# Patient Record
Sex: Female | Born: 1950 | Race: Black or African American | Hispanic: No | Smoking: Former smoker
Health system: Southern US, Community
[De-identification: ages and names within clinical notes are randomized; demographics above are authoritative.]

## PROBLEM LIST (undated history)

## (undated) DIAGNOSIS — E119 Type 2 diabetes mellitus without complications: Secondary | ICD-10-CM

## (undated) DIAGNOSIS — E785 Hyperlipidemia, unspecified: Secondary | ICD-10-CM

## (undated) DIAGNOSIS — G4730 Sleep apnea, unspecified: Secondary | ICD-10-CM

## (undated) DIAGNOSIS — M1990 Unspecified osteoarthritis, unspecified site: Secondary | ICD-10-CM

## (undated) DIAGNOSIS — I10 Essential (primary) hypertension: Secondary | ICD-10-CM

## (undated) DIAGNOSIS — I872 Venous insufficiency (chronic) (peripheral): Secondary | ICD-10-CM

## (undated) DIAGNOSIS — C801 Malignant (primary) neoplasm, unspecified: Secondary | ICD-10-CM

## (undated) HISTORY — PX: BREAST EXCISIONAL BIOPSY: SUR124

## (undated) HISTORY — PX: ROTATOR CUFF REPAIR: SHX139

## (undated) HISTORY — PX: CHOLECYSTECTOMY: SHX55

## (undated) HISTORY — PX: WISDOM TOOTH EXTRACTION: SHX21

## (undated) HISTORY — PX: OTHER SURGICAL HISTORY: SHX169

## (undated) HISTORY — DX: Venous insufficiency (chronic) (peripheral): I87.2

---

## 1998-05-24 DIAGNOSIS — C801 Malignant (primary) neoplasm, unspecified: Secondary | ICD-10-CM

## 1998-05-24 HISTORY — DX: Malignant (primary) neoplasm, unspecified: C80.1

## 1999-05-25 HISTORY — PX: MASTECTOMY: SHX3

## 2013-04-15 ENCOUNTER — Encounter (HOSPITAL_COMMUNITY): Payer: Self-pay | Admitting: Emergency Medicine

## 2013-04-15 ENCOUNTER — Emergency Department (HOSPITAL_COMMUNITY)
Admission: EM | Admit: 2013-04-15 | Discharge: 2013-04-15 | Disposition: A | Discharge status: Home / Self Care | Payer: BC Managed Care – PPO | Attending: Emergency Medicine | Admitting: Emergency Medicine

## 2013-04-15 ENCOUNTER — Emergency Department (HOSPITAL_COMMUNITY): Payer: BC Managed Care – PPO

## 2013-04-15 DIAGNOSIS — M25561 Pain in right knee: Secondary | ICD-10-CM

## 2013-04-15 DIAGNOSIS — I10 Essential (primary) hypertension: Secondary | ICD-10-CM | POA: Insufficient documentation

## 2013-04-15 DIAGNOSIS — Y929 Unspecified place or not applicable: Secondary | ICD-10-CM | POA: Insufficient documentation

## 2013-04-15 DIAGNOSIS — Z79899 Other long term (current) drug therapy: Secondary | ICD-10-CM | POA: Insufficient documentation

## 2013-04-15 DIAGNOSIS — Z853 Personal history of malignant neoplasm of breast: Secondary | ICD-10-CM | POA: Insufficient documentation

## 2013-04-15 DIAGNOSIS — E119 Type 2 diabetes mellitus without complications: Secondary | ICD-10-CM | POA: Insufficient documentation

## 2013-04-15 DIAGNOSIS — S8990XA Unspecified injury of unspecified lower leg, initial encounter: Secondary | ICD-10-CM | POA: Insufficient documentation

## 2013-04-15 DIAGNOSIS — E785 Hyperlipidemia, unspecified: Secondary | ICD-10-CM | POA: Insufficient documentation

## 2013-04-15 DIAGNOSIS — Z87891 Personal history of nicotine dependence: Secondary | ICD-10-CM | POA: Insufficient documentation

## 2013-04-15 DIAGNOSIS — Y9301 Activity, walking, marching and hiking: Secondary | ICD-10-CM | POA: Insufficient documentation

## 2013-04-15 DIAGNOSIS — X500XXA Overexertion from strenuous movement or load, initial encounter: Secondary | ICD-10-CM | POA: Insufficient documentation

## 2013-04-15 HISTORY — DX: Hyperlipidemia, unspecified: E78.5

## 2013-04-15 HISTORY — DX: Type 2 diabetes mellitus without complications: E11.9

## 2013-04-15 HISTORY — DX: Essential (primary) hypertension: I10

## 2013-04-15 HISTORY — DX: Malignant (primary) neoplasm, unspecified: C80.1

## 2013-04-15 MED ORDER — NAPROXEN 500 MG PO TABS: 500.0000 mg | ORAL_TABLET | Freq: Two times a day (BID) | ORAL | Status: DC

## 2013-04-15 MED ORDER — HYDROCODONE-ACETAMINOPHEN 5-325 MG PO TABS: ORAL_TABLET | ORAL | Status: DC

## 2013-04-15 NOTE — ED Notes (Signed)
Pt alert and mentating appropriately upon d/c. Pt given d/c teaching and follow up care instructions with prescriptions. Pt verbalizes understanding and has no further questions upon d/c. Pt requested wheelchair upon d/c. NAD noted upon d/c. ACE bandage in place and ice pack.

## 2013-04-15 NOTE — Progress Notes (Signed)
Met Patient at bedside role of ED Case Manager explained.Patient reports increased right knee pain as reason for ED visit.Patient reports she resides at home with her husband and she       Has been hobbling around.Patient face sheet reflects no PCP or Health Insurance but patient reports patient registration Just updated her demographics and she has Insurance coverage         And follows up with her PCP.No Case Manager needs identified at this time.

## 2013-04-15 NOTE — ED Provider Notes (Signed)
Medical screening examination/treatment/procedure(s) were performed by non-physician practitioner and as supervising physician I was immediately available for consultation/collaboration.  EKG Interpretation   None         Gilda Crease, MD 04/15/13 1044

## 2013-04-15 NOTE — ED Notes (Signed)
Pt reports she twisted her right knee yesterday around 6pm, having pain to posterior knee. sts when she woke up this morning as she was walking she heard her knee pop. sts the back of her knee has been constant pain, able to ambulate but feels that she can't put as much pressure on it. Nad, skin warm and dry, resp e/u.

## 2013-04-15 NOTE — ED Provider Notes (Signed)
CSN: [id number]     Arrival date & time 04/15/13  4098 History   First MD Initiated Contact with Patient 04/15/13 [phone]     Chief Complaint  Patient presents with  . Knee Pain   (Consider location/radiation/quality/duration/timing/severity/associated sxs/prior Treatment) HPI Comments: Patient presents with complaint of right knee pain that began acutely at approximately 6 PM yesterday when she twisted her knee. She complains of mild pain and discomfort that was worse this morning. Pain is made worse with weightbearing and with standing up from a sitting position. She is walking with a limp but can bear weight. Pain is worse over the posterior aspect of the knee. It radiates to her shin. She took Aleve without relief. The onset of this condition was acute. The course is constant.    The history is provided by the patient.    Past Medical History  Diagnosis Date  . Diabetes mellitus without complication   . Hyperlipemia   . Hypertension   . 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 Mastectomy Left 05/1999  . Cholecystectomy    . Wisdom tooth extraction     No family history on file. History  Substance Use Topics  . Smoking status: Former Smoker    Quit date: 05/24/1993  . Smokeless tobacco: Not on file  . Alcohol Use: Yes     Comment: occasionlly   OB History   Grav Para Term Preterm Abortions TAB SAB Ect Mult Living                 Review of Systems  Constitutional: Negative for activity change.  Musculoskeletal: Positive for arthralgias and gait problem. Negative for back pain, joint swelling and neck pain.  Skin: Negative for wound.  Neurological: Negative for weakness and numbness.    Allergies  Review of patient's allergies indicates no known allergies.  Home Medications   Current Outpatient Rx  Name  Route  Sig  Dispense  Refill  . glipiZIDE (GLUCOTROL XL) 10 MG 24 hr tablet   Oral   Take 20 mg by mouth daily.         . Liraglutide (VICTOZA) 18 MG/3ML SOPN   Subcutaneous   Inject 1.8 mLs into the skin daily.         Marland Kitchen lisinopril (PRINIVIL,ZESTRIL) 10 MG tablet   Oral   Take 10 mg by mouth daily.         . metFORMIN (GLUCOPHAGE) 500 MG tablet   Oral   Take 1,000 mg by mouth 2 (two) times daily with a meal.         . rosuvastatin (CRESTOR) 20 MG tablet   Oral   Take 20 mg by mouth daily.         . Vitamin D, Ergocalciferol, (DRISDOL) 50000 UNITS CAPS capsule   Oral   Take 50,000 Units by mouth every 7 (seven) days.         Marland Kitchen HYDROcodone-acetaminophen (NORCO/VICODIN) 5-325 MG per tablet      Take 1-2 tablets every 6 hours as needed for severe pain   10 tablet   0   . naproxen (NAPROSYN) 500 MG tablet   Oral   Take 1 tablet (500 mg total) by mouth 2 (two) times daily.   30 tablet   0    BP 134/79  Pulse 68  Temp(Src) 96.8 F (36 C) (Oral)  Resp 18  SpO2 96% Physical Exam  Nursing  note and vitals reviewed. Constitutional: She appears well-developed and well-nourished.  HENT:  Head: Normocephalic and atraumatic.  Eyes: Pupils are equal, round, and reactive to light.  Neck: Normal range of motion. Neck supple.  Cardiovascular: Exam reveals no decreased pulses.   Pulses:      Dorsalis pedis pulses are 2+ on the right side, and 2+ on the left side.       Posterior tibial pulses are 2+ on the right side, and 2+ on the left side.  Musculoskeletal: She exhibits tenderness. She exhibits no edema.       Right hip: Normal.       Right knee: Tenderness found. Medial joint line and lateral joint line tenderness noted.       Right ankle: Normal.       Legs: Patient can extend knee against gravity. Pain with full active extension and forced flexion.   Neurological: She is alert. No sensory deficit.  Motor, sensation, and vascular distal to the injury is fully intact.   Skin: Skin is warm and dry.  Psychiatric: She has a normal mood and affect.    ED Course  Procedures  (including critical care time) Labs Review Labs Reviewed - No data to display Imaging Review Dg Knee Complete 4 Views Right  04/15/2013   CLINICAL DATA:  Right knee pain after injury.  EXAM: RIGHT KNEE - COMPLETE 4+ VIEW  COMPARISON:  None.  FINDINGS: There is no evidence of fracture, dislocation, or joint effusion. Spurring of superior aspect of patella is noted. No significant joint space narrowing is noted. Minimal spurring of tibial spines is noted. Soft tissues are unremarkable.  IMPRESSION: Minimal degenerative changes are noted. No acute abnormality seen in the right knee.   Electronically Signed   By: Roque Lias M.D.   On: 04/15/2013 09:48    EKG Interpretation   None      8:58 AM Patient seen and examined. Work-up initiated. Medications ordered.   Vital signs reviewed and are as follows: Filed Vitals:   04/15/13 0845  BP: 134/79  Pulse: 68  Temp: 96.8 F (36 C)  Resp: 18   10:41 AM Xray result reviewed. Patient informed. TX: ACE, RICE, pain medicine, NSAID, ortho f/u if not improved in 1 week. Patient verbalizes understanding and agrees with plan.   Patient counseled on use of narcotic pain medications. Counseled not to combine these medications with others containing tylenol. Urged not to drink alcohol, drive, or perform any other activities that requires focus while taking these medications. The patient verbalizes understanding and agrees with the plan.     MDM   1. Knee pain, acute, right    X-ray negative, suspect hamstring injury based on exam. Conservative mgmt with orthopedic followup if not improving.   Renne Crigler, PA-C 04/15/13 1042

## 2013-04-15 NOTE — ED Notes (Signed)
Pt returns from radiology. 

## 2013-04-15 NOTE — ED Notes (Signed)
Josh, PA at bedside. °

## 2014-01-14 ENCOUNTER — Ambulatory Visit (INDEPENDENT_AMBULATORY_CARE_PROVIDER_SITE_OTHER): Payer: Medicare PPO | Admitting: Family Medicine

## 2014-01-14 DIAGNOSIS — R609 Edema, unspecified: Secondary | ICD-10-CM

## 2014-01-14 DIAGNOSIS — IMO0001 Reserved for inherently not codable concepts without codable children: Secondary | ICD-10-CM

## 2014-01-14 DIAGNOSIS — I10 Essential (primary) hypertension: Secondary | ICD-10-CM

## 2014-01-14 DIAGNOSIS — R600 Localized edema: Secondary | ICD-10-CM

## 2014-01-14 DIAGNOSIS — Z1211 Encounter for screening for malignant neoplasm of colon: Secondary | ICD-10-CM

## 2014-01-14 DIAGNOSIS — Z1231 Encounter for screening mammogram for malignant neoplasm of breast: Secondary | ICD-10-CM

## 2014-01-14 DIAGNOSIS — E1165 Type 2 diabetes mellitus with hyperglycemia: Secondary | ICD-10-CM

## 2014-01-14 DIAGNOSIS — E785 Hyperlipidemia, unspecified: Secondary | ICD-10-CM

## 2014-01-14 MED ORDER — LISINOPRIL-HYDROCHLOROTHIAZIDE 20-12.5 MG PO TABS: 1.0000 | ORAL_TABLET | Freq: Every day | ORAL | Status: DC

## 2014-01-14 MED ORDER — NAPROXEN 500 MG PO TABS: 500.0000 mg | ORAL_TABLET | Freq: Two times a day (BID) | ORAL | Status: DC | PRN

## 2014-01-14 NOTE — Progress Notes (Signed)
Subjective:    Patient ID: [name], female    DOB: June 19, 1950, 63 y.o.   MRN: [id number]  HPI  Ms. Spearmand presents to office to establish care.She states that she was going to [facility] in [place], [place] with Dr. Carolanne Grumbling prior to relocating to [place].    Patient reports that she has a history of diabetes. She reports that she was previously on Metformin and Victoza. She stopped taking medications and was following a homeopathic regimen. She reports that she discontinued the regimen several months ago. She reports that she was also started on Lisinopril for DMII. The naturalist recommended that she continue this medication due to the fact that it should not discontinue it abruptly. She reports that she takes Lisinopril and Crestor for hyperlipidemia daily. She reports that she exercises 5 days per week, but has been unable to lose weight. She reports that she is currently eating 1 meal per day.   Patient reports and history of  hyperlipidemia.   There is not a family history of hyperlipidemia or of early ischemia heart disease. She currently denies chest pains, palpitations, dizziness, headaches, weakness, or shortness of breath.    Patient complains of periodic swelling to feet.   The edema has been mild and localized.   Onset of symptoms was several months ago. She states that edema has not worsened over time.  The edema is present intermittently. The swelling has been aggravated by increased salt intake, relieved by elevation of involved area.     Review of Systems  Constitutional: Negative.   HENT: Negative.   Eyes: Negative.   Respiratory: Negative.   Cardiovascular: Positive for leg swelling (bilateral ankle edema).  Gastrointestinal: Positive for abdominal distention.  Endocrine: Negative.   Genitourinary: Negative.   Musculoskeletal: Positive for joint swelling and myalgias.  Skin: Negative.   Neurological: Negative.   Hematological: Negative.    Psychiatric/Behavioral: Negative.        Objective:   Physical Exam  Constitutional: She is oriented to person, place, and time. She appears well-developed and well-nourished.  HENT:  Head: Normocephalic and atraumatic.  Right Ear: External ear normal.  Neck: Normal range of motion. Neck supple.  Cardiovascular: Normal rate, regular rhythm, normal heart sounds, intact distal pulses and normal pulses.  Exam reveals no decreased pulses.   Pulses:      Radial pulses are 2+ on the right side, and 2+ on the left side.  Bilateral lower extremity edema  Pulmonary/Chest: Effort normal and breath sounds normal.  Abdominal: Soft. Bowel sounds are normal.  Musculoskeletal: Normal range of motion.       Right ankle: She exhibits swelling. She exhibits normal range of motion and no ecchymosis.       Left ankle: She exhibits swelling. She exhibits no ecchymosis.  Neurological: She is alert and oriented to person, place, and time. She has normal reflexes.  Skin: Skin is warm and dry.  Psychiatric: She has a normal mood and affect. Her behavior is normal. Judgment and thought content normal.         BP 171/83  Pulse 80  Temp(Src) 98.2 F (36.8 C) (Oral)  Resp 16  Ht 5' 0.5" (1.537 m)  Wt 245 lb (111.131 kg)  BMI 47.04 kg/m2 Assessment & Plan:    1. Type II or unspecified type diabetes mellitus without mention of complication, uncontrolled -Patient is currently not taking medications for diabetes. She was taking Victoza and Metformin until December 2014 She states that her  last hemoglobin A1c was 6. She reports that she is currently exercising and has decreased meals. Recommend that she start an 1800 calorie lowfat, low carbohydrate diet divided over 6 small meals, increase water intake to 6-8 glasses per day, and continue exercise regimen.  - Hemoglobin A1c; Future - COMPLETE METABOLIC PANEL WITH GFR; Future - Urinalysis, Complete; Future  2. Morbid obesity  - TSH; Future  3.  Unspecified essential hypertension Blood pressure elevated on manual re-check 182/110.  - Start lisinopril-hydrochlorothiazide (ZESTORETIC) 20-12.5 MG per tablet; Take 1 tablet by mouth daily.  Dispense: 30 tablet; Refill: 2  4. Bilateral edema of lower extremity 2 + pitting edema to bilateral lower extremities. She reports that edema primarily occurs at the end of the day. Wlill start [place].  - Pro b natriuretic peptide; Future - Urinalysis, Complete; Future  5. Other and unspecified hyperlipidemia  - Lipid Panel; Future  6. Other screening mammogram Mammogram was in May 2015 in [facility], patient was instructed to follow up in 6 months. She will need a mammogram in October.  She reports that it has been several years since last mammogram. She reports a history of left breast leakage. Will send referral   7. Screening for colon cancer She states that she has never had a colonoscopy. Will send referral  - Ambulatory referral to Gastroenterology     Dorena Dew, FNP

## 2014-01-14 NOTE — Patient Instructions (Addendum)
DASH Eating Plan DASH stands for "Dietary Approaches to Stop Hypertension." The DASH eating plan is a healthy eating plan that has been shown to reduce high blood pressure (hypertension). Additional health benefits may include reducing the risk of type 2 diabetes mellitus, heart disease, and stroke. The DASH eating plan may also help with weight loss. WHAT DO I NEED TO KNOW ABOUT THE DASH EATING PLAN? For the DASH eating plan, you will follow these general guidelines:  Choose foods with a percent daily value for sodium of less than 5% (as listed on the food label).  Use salt-free seasonings or herbs instead of table salt or sea salt.  Check with your health care provider or pharmacist before using salt substitutes.  Eat lower-sodium products, often labeled as "lower sodium" or "no salt added."  Eat fresh foods.  Eat more vegetables, fruits, and low-fat dairy products.  Choose whole grains. Look for the word "whole" as the first word in the ingredient list.  Choose fish and skinless chicken or turkey more often than red meat. Limit fish, poultry, and meat to 6 oz (170 g) each day.  Limit sweets, desserts, sugars, and sugary drinks.  Choose heart-healthy fats.  Limit cheese to 1 oz (28 g) per day.  Eat more home-cooked food and less restaurant, buffet, and fast food.  Limit fried foods.  Cook foods using methods other than frying.  Limit canned vegetables. If you do use them, rinse them well to decrease the sodium.  When eating at a restaurant, ask that your food be prepared with less salt, or no salt if possible. WHAT FOODS CAN I EAT? Seek help from a dietitian for individual calorie needs. Grains Whole grain or whole wheat bread. Brown rice. Whole grain or whole wheat pasta. Quinoa, bulgur, and whole grain cereals. Low-sodium cereals. Corn or whole wheat flour tortillas. Whole grain cornbread. Whole grain crackers. Low-sodium crackers. Vegetables Fresh or frozen vegetables  (raw, steamed, roasted, or grilled). Low-sodium or reduced-sodium tomato and vegetable juices. Low-sodium or reduced-sodium tomato sauce and paste. Low-sodium or reduced-sodium canned vegetables.  Fruits All fresh, canned (in natural juice), or frozen fruits. Meat and Other Protein Products Ground beef (85% or leaner), grass-fed beef, or beef trimmed of fat. Skinless chicken or turkey. Ground chicken or turkey. Pork trimmed of fat. All fish and seafood. Eggs. Dried beans, peas, or lentils. Unsalted nuts and seeds. Unsalted canned beans. Dairy Low-fat dairy products, such as skim or 1% milk, 2% or reduced-fat cheeses, low-fat ricotta or cottage cheese, or plain low-fat yogurt. Low-sodium or reduced-sodium cheeses. Fats and Oils Tub margarines without trans fats. Light or reduced-fat mayonnaise and salad dressings (reduced sodium). Avocado. Safflower, olive, or canola oils. Natural peanut or almond butter. Other Unsalted popcorn and pretzels. The items listed above may not be a complete list of recommended foods or beverages. Contact your dietitian for more options. WHAT FOODS ARE NOT RECOMMENDED? Grains White bread. White pasta. White rice. Refined cornbread. Bagels and croissants. Crackers that contain trans fat. Vegetables Creamed or fried vegetables. Vegetables in a cheese sauce. Regular canned vegetables. Regular canned tomato sauce and paste. Regular tomato and vegetable juices. Fruits Dried fruits. Canned fruit in light or heavy syrup. Fruit juice. Meat and Other Protein Products Fatty cuts of meat. Ribs, chicken wings, bacon, sausage, bologna, salami, chitterlings, fatback, hot dogs, bratwurst, and packaged luncheon meats. Salted nuts and seeds. Canned beans with salt. Dairy Whole or 2% milk, cream, half-and-half, and cream cheese. Whole-fat or sweetened yogurt. Full-fat   cheeses or blue cheese. Nondairy creamers and whipped toppings. Processed cheese, cheese spreads, or cheese  curds. Condiments Onion and garlic salt, seasoned salt, table salt, and sea salt. Canned and packaged gravies. Worcestershire sauce. Tartar sauce. Barbecue sauce. Teriyaki sauce. Soy sauce, including reduced sodium. Steak sauce. Fish sauce. Oyster sauce. Cocktail sauce. Horseradish. Ketchup and mustard. Meat flavorings and tenderizers. Bouillon cubes. Hot sauce. Tabasco sauce. Marinades. Taco seasonings. Relishes. Fats and Oils Butter, stick margarine, lard, shortening, ghee, and bacon fat. Coconut, palm kernel, or palm oils. Regular salad dressings. Other Pickles and olives. Salted popcorn and pretzels. The items listed above may not be a complete list of foods and beverages to avoid. Contact your dietitian for more information. WHERE CAN I FIND MORE INFORMATION? National Heart, Lung, and Blood Institute: travelstabloid.com Document Released: 04/29/2011 Document Revised: 09/24/2013 Document Reviewed: 03/14/2013 [facility] Patient Information 2015 [place], [place]. This information is not intended to replace advice given to you by your health care provider. Make sure you discuss any questions you have with your health care provider.  Diet Example  Patient is to eat 6 small meals throughout the day. Rule of 10 (don't exceed 10 of anything) Breakfast- [place] bacon, egg whites Snack-10 grapes Lunch-1/2 sandwich Snack-10 carrots Dinner(fish, 2 vegetables, brown rice) Snack- Ex. Popcorn (lowfat)   Exercise 3-5 times per week (3 days water aerobics, and 2 days low impact aerobics)

## 2014-01-15 ENCOUNTER — Other Ambulatory Visit: Payer: Medicare Other

## 2014-01-15 DIAGNOSIS — E785 Hyperlipidemia, unspecified: Secondary | ICD-10-CM

## 2014-01-15 DIAGNOSIS — IMO0001 Reserved for inherently not codable concepts without codable children: Secondary | ICD-10-CM

## 2014-01-15 DIAGNOSIS — I10 Essential (primary) hypertension: Secondary | ICD-10-CM

## 2014-01-15 DIAGNOSIS — R600 Localized edema: Secondary | ICD-10-CM

## 2014-01-15 DIAGNOSIS — E1165 Type 2 diabetes mellitus with hyperglycemia: Secondary | ICD-10-CM

## 2014-01-16 LAB — COMPLETE METABOLIC PANEL WITH GFR
ALBUMIN: 4 g/dL (ref 3.5–5.2)
ALT: 33 U/L (ref 0–35)
AST: 19 U/L (ref 0–37)
Alkaline Phosphatase: 108 U/L (ref 39–117)
BILIRUBIN TOTAL: 0.6 mg/dL (ref 0.2–1.2)
BUN: 16 mg/dL (ref 6–23)
CO2: 25 mEq/L (ref 19–32)
Calcium: 9.4 mg/dL (ref 8.4–10.5)
Chloride: 102 mEq/L (ref 96–112)
Creat: 0.56 mg/dL (ref 0.50–1.10)
GFR, Est African American: >89 mL/min
GFR, Est Non African American: >89 mL/min
GLUCOSE: 199 mg/dL — AB (ref 70–99)
Potassium: 4.3 mEq/L (ref 3.5–5.3)
Sodium: 138 mEq/L (ref 135–145)
Total Protein: 6.9 g/dL (ref 6.0–8.3)

## 2014-01-16 LAB — URINALYSIS, COMPLETE
Bacteria, UA: NONE SEEN
Bilirubin Urine: NEGATIVE
CASTS: NONE SEEN
Crystals: NONE SEEN
Glucose, UA: NEGATIVE mg/dL
HGB URINE DIPSTICK: NEGATIVE
Ketones, ur: NEGATIVE mg/dL
Leukocytes, UA: NEGATIVE
Nitrite: NEGATIVE
Protein, ur: NEGATIVE mg/dL
Specific Gravity, Urine: 1.023 (ref 1.005–1.030)
Urobilinogen, UA: 0.2 mg/dL (ref 0.0–1.0)
pH: 5.5 (ref 5.0–8.0)

## 2014-01-16 LAB — TSH: TSH: 1.17 u[IU]/mL (ref 0.350–4.500)

## 2014-01-16 LAB — LIPID PANEL
Cholesterol: 169 mg/dL (ref 0–200)
HDL: 45 mg/dL (ref >39)
LDL Cholesterol: 96 mg/dL (ref 0–99)
Total CHOL/HDL Ratio: 3.8 Ratio
Triglycerides: 140 mg/dL (ref <150)
VLDL: 28 mg/dL (ref 0–40)

## 2014-01-16 LAB — HEMOGLOBIN A1C
Hgb A1c MFr Bld: 8.3 % — ABNORMAL HIGH (ref <5.7)
Mean Plasma Glucose: 192 mg/dL — ABNORMAL HIGH (ref <117)

## 2014-01-16 LAB — PRO B NATRIURETIC PEPTIDE: PRO B NATRI PEPTIDE: 14.71 pg/mL (ref <126)

## 2014-01-17 ENCOUNTER — Telehealth: Payer: Self-pay | Admitting: Family Medicine

## 2014-01-17 ENCOUNTER — Encounter: Payer: Self-pay | Admitting: Family Medicine

## 2014-01-17 DIAGNOSIS — R600 Localized edema: Secondary | ICD-10-CM | POA: Insufficient documentation

## 2014-01-17 DIAGNOSIS — E1165 Type 2 diabetes mellitus with hyperglycemia: Principal | ICD-10-CM

## 2014-01-17 DIAGNOSIS — I10 Essential (primary) hypertension: Secondary | ICD-10-CM | POA: Insufficient documentation

## 2014-01-17 DIAGNOSIS — Z1231 Encounter for screening mammogram for malignant neoplasm of breast: Secondary | ICD-10-CM | POA: Insufficient documentation

## 2014-01-17 DIAGNOSIS — E119 Type 2 diabetes mellitus without complications: Secondary | ICD-10-CM | POA: Insufficient documentation

## 2014-01-17 DIAGNOSIS — E785 Hyperlipidemia, unspecified: Secondary | ICD-10-CM | POA: Insufficient documentation

## 2014-01-17 DIAGNOSIS — IMO0001 Reserved for inherently not codable concepts without codable children: Secondary | ICD-10-CM

## 2014-01-17 DIAGNOSIS — Z1211 Encounter for screening for malignant neoplasm of colon: Secondary | ICD-10-CM | POA: Insufficient documentation

## 2014-01-17 MED ORDER — SITAGLIPTIN PHOS-METFORMIN HCL 50-500 MG PO TABS: 1.0000 | ORAL_TABLET | Freq: Two times a day (BID) | ORAL | Status: DC

## 2014-01-17 NOTE — Telephone Encounter (Signed)
Meds ordered this encounter  Medications  . sitaGLIPtin-metformin (JANUMET) 50-500 MG per tablet    Sig: Take 1 tablet by mouth 2 (two) times daily with a meal.    Dispense:  30 tablet    Refill:  2    Order Specific Question:  Supervising Provider    Answer:  MATTHEWS, MICHELLE A [[id number]]  Discussed Hemoglobin A1c results at length. Will add Janumet 50-500 mg BID. Also, Ms. Smaltz will check blood sugar every am prior to breakfast. She will bring blood sugar diary to next appt. She has a CBG monitor and strips. Will also refer to diabetes education. Patient will return for a re-check in 1 month.    Dorena Dew, FNP

## 2014-01-18 ENCOUNTER — Telehealth (HOSPITAL_COMMUNITY): Payer: Self-pay | Admitting: *Deleted

## 2014-01-18 ENCOUNTER — Other Ambulatory Visit: Payer: Self-pay

## 2014-01-18 DIAGNOSIS — IMO0001 Reserved for inherently not codable concepts without codable children: Secondary | ICD-10-CM

## 2014-01-18 DIAGNOSIS — E1165 Type 2 diabetes mellitus with hyperglycemia: Principal | ICD-10-CM

## 2014-01-18 MED ORDER — SITAGLIPTIN PHOS-METFORMIN HCL 50-500 MG PO TABS: 1.0000 | ORAL_TABLET | Freq: Two times a day (BID) | ORAL | Status: DC

## 2014-01-18 NOTE — Telephone Encounter (Signed)
Spoke with Pharmacy and re-sent rx in via e-scribe. Called and notified patient that rx had been sent again and she could pick it up with them in approximently 1 hour. Thanks!

## 2014-01-18 NOTE — Telephone Encounter (Signed)
Received patient call concerning prescription; patient states she has contacted the pharmacy at [facility] this morning and they state that they do not have prescription for her present. This RN verified correct pharmacy and followed up with Mickel Baas LPN. Mickel Baas states prescription faxed yesterday to [facility]. Mickel Baas to call [facility] to verify prescription. Patient aware.

## 2014-02-11 ENCOUNTER — Telehealth: Payer: Self-pay

## 2014-02-11 NOTE — Telephone Encounter (Signed)
Left message for patient to call back regarding colonoscopy

## 2014-02-18 ENCOUNTER — Ambulatory Visit (INDEPENDENT_AMBULATORY_CARE_PROVIDER_SITE_OTHER): Payer: Medicare Other | Admitting: Family Medicine

## 2014-02-18 VITALS — BP 132/62 | HR 67 | Temp 98.4°F | Resp 16 | Ht 60.5 in | Wt 246.0 lb

## 2014-02-18 DIAGNOSIS — E1165 Type 2 diabetes mellitus with hyperglycemia: Secondary | ICD-10-CM

## 2014-02-18 DIAGNOSIS — IMO0002 Reserved for concepts with insufficient information to code with codable children: Secondary | ICD-10-CM

## 2014-02-18 DIAGNOSIS — Z23 Encounter for immunization: Secondary | ICD-10-CM

## 2014-02-18 DIAGNOSIS — I10 Essential (primary) hypertension: Secondary | ICD-10-CM

## 2014-02-18 DIAGNOSIS — IMO0001 Reserved for inherently not codable concepts without codable children: Secondary | ICD-10-CM

## 2014-02-18 MED ORDER — METFORMIN HCL 500 MG PO TABS: 500.0000 mg | ORAL_TABLET | Freq: Two times a day (BID) | ORAL | Status: DC

## 2014-02-18 MED ORDER — LISINOPRIL-HYDROCHLOROTHIAZIDE 20-12.5 MG PO TABS: 1.0000 | ORAL_TABLET | Freq: Every day | ORAL | Status: DC

## 2014-02-18 NOTE — Patient Instructions (Signed)
DASH Eating Plan DASH stands for "Dietary Approaches to Stop Hypertension." The DASH eating plan is a healthy eating plan that has been shown to reduce high blood pressure (hypertension). Additional health benefits may include reducing the risk of type 2 diabetes mellitus, heart disease, and stroke. The DASH eating plan may also help with weight loss. WHAT DO I NEED TO KNOW ABOUT THE DASH EATING PLAN? For the DASH eating plan, you will follow these general guidelines:  Choose foods with a percent daily value for sodium of less than 5% (as listed on the food label).  Use salt-free seasonings or herbs instead of table salt or sea salt.  Check with your health care provider or pharmacist before using salt substitutes.  Eat lower-sodium products, often labeled as "lower sodium" or "no salt added."  Eat fresh foods.  Eat more vegetables, fruits, and low-fat dairy products.  Choose whole grains. Look for the word "whole" as the first word in the ingredient list.  Choose fish and skinless chicken or turkey more often than red meat. Limit fish, poultry, and meat to 6 oz (170 g) each day.  Limit sweets, desserts, sugars, and sugary drinks.  Choose heart-healthy fats.  Limit cheese to 1 oz (28 g) per day.  Eat more home-cooked food and less restaurant, buffet, and fast food.  Limit fried foods.  Cook foods using methods other than frying.  Limit canned vegetables. If you do use them, rinse them well to decrease the sodium.  When eating at a restaurant, ask that your food be prepared with less salt, or no salt if possible. WHAT FOODS CAN I EAT? Seek help from a dietitian for individual calorie needs. Grains Whole grain or whole wheat bread. Brown rice. Whole grain or whole wheat pasta. Quinoa, bulgur, and whole grain cereals. Low-sodium cereals. Corn or whole wheat flour tortillas. Whole grain cornbread. Whole grain crackers. Low-sodium crackers. Vegetables Fresh or frozen vegetables  (raw, steamed, roasted, or grilled). Low-sodium or reduced-sodium tomato and vegetable juices. Low-sodium or reduced-sodium tomato sauce and paste. Low-sodium or reduced-sodium canned vegetables.  Fruits All fresh, canned (in natural juice), or frozen fruits. Meat and Other Protein Products Ground beef (85% or leaner), grass-fed beef, or beef trimmed of fat. Skinless chicken or turkey. Ground chicken or turkey. Pork trimmed of fat. All fish and seafood. Eggs. Dried beans, peas, or lentils. Unsalted nuts and seeds. Unsalted canned beans. Dairy Low-fat dairy products, such as skim or 1% milk, 2% or reduced-fat cheeses, low-fat ricotta or cottage cheese, or plain low-fat yogurt. Low-sodium or reduced-sodium cheeses. Fats and Oils Tub margarines without trans fats. Light or reduced-fat mayonnaise and salad dressings (reduced sodium). Avocado. Safflower, olive, or canola oils. Natural peanut or almond butter. Other Unsalted popcorn and pretzels. The items listed above may not be a complete list of recommended foods or beverages. Contact your dietitian for more options. WHAT FOODS ARE NOT RECOMMENDED? Grains White bread. White pasta. White rice. Refined cornbread. Bagels and croissants. Crackers that contain trans fat. Vegetables Creamed or fried vegetables. Vegetables in a cheese sauce. Regular canned vegetables. Regular canned tomato sauce and paste. Regular tomato and vegetable juices. Fruits Dried fruits. Canned fruit in light or heavy syrup. Fruit juice. Meat and Other Protein Products Fatty cuts of meat. Ribs, chicken wings, bacon, sausage, bologna, salami, chitterlings, fatback, hot dogs, bratwurst, and packaged luncheon meats. Salted nuts and seeds. Canned beans with salt. Dairy Whole or 2% milk, cream, half-and-half, and cream cheese. Whole-fat or sweetened yogurt. Full-fat   cheeses or blue cheese. Nondairy creamers and whipped toppings. Processed cheese, cheese spreads, or cheese  curds. Condiments Onion and garlic salt, seasoned salt, table salt, and sea salt. Canned and packaged gravies. Worcestershire sauce. Tartar sauce. Barbecue sauce. Teriyaki sauce. Soy sauce, including reduced sodium. Steak sauce. Fish sauce. Oyster sauce. Cocktail sauce. Horseradish. Ketchup and mustard. Meat flavorings and tenderizers. Bouillon cubes. Hot sauce. Tabasco sauce. Marinades. Taco seasonings. Relishes. Fats and Oils Butter, stick margarine, lard, shortening, ghee, and bacon fat. Coconut, palm kernel, or palm oils. Regular salad dressings. Other Pickles and olives. Salted popcorn and pretzels. The items listed above may not be a complete list of foods and beverages to avoid. Contact your dietitian for more information. WHERE CAN I FIND MORE INFORMATION? National Heart, Lung, and Blood Institute: travelstabloid.com Document Released: 04/29/2011 Document Revised: 09/24/2013 Document Reviewed: 03/14/2013 [facility] Patient Information 2015 [place], [place]. This information is not intended to replace advice given to you by your health care provider. Make sure you discuss any questions you have with your health care provider. Diabetes and Exercise Exercising regularly is important. It is not just about losing weight. It has many health benefits, such as:  Improving your overall fitness, flexibility, and endurance.  Increasing your bone density.  Helping with weight control.  Decreasing your body fat.  Increasing your muscle strength.  Reducing stress and tension.  Improving your overall health. People with diabetes who exercise gain additional benefits because exercise:  Reduces appetite.  Improves the body's use of blood sugar (glucose).  Helps lower or control blood glucose.  Decreases blood pressure.  Helps control blood lipids (such as cholesterol and triglycerides).  Improves the body's use of the hormone insulin by:  Increasing the  body's insulin sensitivity.  Reducing the body's insulin needs.  Decreases the risk for heart disease because exercising:  Lowers cholesterol and triglycerides levels.  Increases the levels of good cholesterol (such as high-density lipoproteins [HDL]) in the body.  Lowers blood glucose levels. YOUR ACTIVITY PLAN  Choose an activity that you enjoy and set realistic goals. Your health care provider or diabetes educator can help you make an activity plan that works for you. Exercise regularly as directed by your health care provider. This includes:  Performing resistance training twice a week such as push-ups, sit-ups, lifting weights, or using resistance bands.  Performing 150 minutes of cardio exercises each week such as walking, running, or playing sports.  Staying active and spending no more than 90 minutes at one time being inactive. Even short bursts of exercise are good for you. Three 10-minute sessions spread throughout the day are just as beneficial as a single 30-minute session. Some exercise ideas include:  Taking the dog for a walk.  Taking the stairs instead of the elevator.  Dancing to your favorite song.  Doing an exercise video.  Doing your favorite exercise with a friend. RECOMMENDATIONS FOR EXERCISING WITH TYPE 1 OR TYPE 2 DIABETES   Check your blood glucose before exercising. If blood glucose levels are greater than 240 mg/dL, check for urine ketones. Do not exercise if ketones are present.  Avoid injecting insulin into areas of the body that are going to be exercised. For example, avoid injecting insulin into:  The arms when playing tennis.  The legs when jogging.  Keep a record of:  Food intake before and after you exercise.  Expected peak times of insulin action.  Blood glucose levels before and after you exercise.  The type and amount of exercise  you have done.  Review your records with your health care provider. Your health care provider will  help you to develop guidelines for adjusting food intake and insulin amounts before and after exercising.  If you take insulin or oral hypoglycemic agents, watch for signs and symptoms of hypoglycemia. They include:  Dizziness.  Shaking.  Sweating.  Chills.  Confusion.  Drink plenty of water while you exercise to prevent dehydration or heat stroke. Body water is lost during exercise and must be replaced.  Talk to your health care provider before starting an exercise program to make sure it is safe for you. Remember, almost any type of activity is better than none. Document Released: 07/31/2003 Document Revised: 09/24/2013 Document Reviewed: 10/17/2012 [employer] Patient Information 2015 [employer]. This information is not intended to replace advice given to you by your health care provider. Make sure you discuss any questions you have with your health care provider.  

## 2014-02-18 NOTE — Progress Notes (Signed)
   Subjective:    Patient ID: Erin House, female    DOB: 1950/12/28, 63 y.o.   MRN: [id number]  HPI Erin House presents for a follow up of type II diabetes mellitus. She states that she has been checking her blood sugars daily and average daily glucose has ranged between 150-160 since last visit. She states that she is continuing to exercise at the local [employer] and follow a balanced diet. Erin House denies headache, weakness, fatigue, polydipsia, polyuria, polyphagia, nausea, vomiting, or diarrhea.   Erin House is also follow up for hypertension. Her blood pressure has improved since adding medications and modifying diet.  She is exercising and is adherent to low salt diet.  Blood pressure is well controlled at home. Patient denies chest pain, dyspnea, fatigue, orthopnea, palpitations, syncope and tachypnea.  Cardiovascular risk factors: diabetes mellitus, dyslipidemia and obesity (BMI >= 30 kg/m2).   Past Medical History  Diagnosis Date  . Diabetes mellitus without complication   . Hyperlipemia   . Hypertension   . 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  Negative for fatigue.  HENT: Negative.   Eyes: Negative.   Respiratory: Negative.   Cardiovascular: Negative.   Gastrointestinal: Negative.   Endocrine: Negative.   Genitourinary: Negative.   Musculoskeletal: Negative.   Skin: Negative.   Allergic/Immunologic: Negative.   Neurological: Positive for facial asymmetry.  Hematological: Negative.   Psychiatric/Behavioral: Negative.        Objective:   Physical Exam  Constitutional: She is oriented to person, place, and time. Vital signs are normal. She appears well-developed and well-nourished.  HENT:  Head: Normocephalic and atraumatic.  Right Ear: External ear normal.  Left Ear: External ear normal.  Mouth/Throat: Oropharynx is clear and moist.  Eyes: Pupils are equal, round, and reactive to light.  Neck: Normal range of  motion.  Cardiovascular: Normal rate, normal heart sounds and intact distal pulses.   Pulmonary/Chest: Effort normal and breath sounds normal.  Abdominal: Soft. Bowel sounds are normal.  Musculoskeletal: Normal range of motion.  Neurological: She is alert and oriented to person, place, and time. She has normal reflexes.  Skin: Skin is warm and dry.  Psychiatric: She has a normal mood and affect. Her behavior is normal. Judgment and thought content normal.         BP 146/59  Pulse 67  Temp(Src) 98.4 F (36.9 C) (Oral)  Resp 16  Ht 5' 0.5" (1.537 m)  Wt 246 lb (111.585 kg)  BMI 47.23 kg/m2  Assessment & Plan:  1. Unspecified essential hypertension Blood pressure is stable since last visit. Will continue current medication regimen.  - lisinopril-hydrochlorothiazide (ZESTORETIC) 20-12.5 MG per tablet; Take 1 tablet by mouth daily.  Dispense: 30 tablet; Refill: 2  2. Type II diabetes mellitus, uncontrolled  - Hemoglobin A1c; Future - sitaGLIPtin-metformin (JANUMET) 50-500 MG per tablet; Take 1 tablet by mouth 2 (two) times daily with a meal.  Dispense: 180 tablet; Refill: 0   3. Need for immunization against influenza  - Flu Vaccine QUAD 36+ mos IM (Fluarix)  4. Obesity, morbid Continue low calorie diet and exercise regimen. Patient reports that she is doing aerobics 3 days per week.    RTC: 2 months with Dr. Zigmund Daniel for follow up of DMII and HTN

## 2014-02-20 ENCOUNTER — Telehealth: Payer: Self-pay

## 2014-02-20 NOTE — Telephone Encounter (Signed)
Rec'd from [facility] forward 8 pages to Historical Provider

## 2014-02-21 ENCOUNTER — Encounter: Payer: Self-pay | Admitting: Family Medicine

## 2014-02-21 DIAGNOSIS — Z23 Encounter for immunization: Secondary | ICD-10-CM | POA: Insufficient documentation

## 2014-02-21 DIAGNOSIS — E1165 Type 2 diabetes mellitus with hyperglycemia: Secondary | ICD-10-CM | POA: Insufficient documentation

## 2014-02-21 DIAGNOSIS — IMO0001 Reserved for inherently not codable concepts without codable children: Secondary | ICD-10-CM | POA: Insufficient documentation

## 2014-02-21 DIAGNOSIS — IMO0002 Reserved for concepts with insufficient information to code with codable children: Secondary | ICD-10-CM | POA: Insufficient documentation

## 2014-02-21 MED ORDER — SITAGLIPTIN PHOS-METFORMIN HCL 50-500 MG PO TABS: 1.0000 | ORAL_TABLET | Freq: Two times a day (BID) | ORAL | Status: DC

## 2014-02-22 ENCOUNTER — Telehealth: Payer: Self-pay | Admitting: Internal Medicine

## 2014-02-22 NOTE — Telephone Encounter (Signed)
Called patient and notified her that NP [facility] had sent an order for Metformin to her pharmacy on 02/21/2014; pt verbalizes understanding

## 2014-02-28 ENCOUNTER — Other Ambulatory Visit: Payer: Self-pay | Admitting: Family Medicine

## 2014-02-28 DIAGNOSIS — N6452 Nipple discharge: Secondary | ICD-10-CM

## 2014-03-01 ENCOUNTER — Other Ambulatory Visit: Payer: Self-pay | Admitting: Family Medicine

## 2014-03-01 DIAGNOSIS — N6452 Nipple discharge: Secondary | ICD-10-CM

## 2014-04-04 ENCOUNTER — Other Ambulatory Visit: Payer: Medicare Other

## 2014-04-04 DIAGNOSIS — IMO0002 Reserved for concepts with insufficient information to code with codable children: Secondary | ICD-10-CM

## 2014-04-04 DIAGNOSIS — E1165 Type 2 diabetes mellitus with hyperglycemia: Secondary | ICD-10-CM

## 2014-04-04 LAB — HEMOGLOBIN A1C
HEMOGLOBIN A1C: 7.7 % — AB (ref <5.7)
Mean Plasma Glucose: 174 mg/dL — ABNORMAL HIGH (ref <117)

## 2014-04-05 ENCOUNTER — Telehealth: Payer: Self-pay | Admitting: Family Medicine

## 2014-04-05 NOTE — Telephone Encounter (Signed)
Reviewed labs. Hemoglobin A1C decreased to 7.7 from 8.3 on current medication regimen. Will continue regimen and follow up in office with Dr. Zigmund Daniel as scheduled.    Dorena Dew, FNP

## 2014-04-09 ENCOUNTER — Other Ambulatory Visit: Payer: Self-pay | Admitting: Family Medicine

## 2014-04-09 ENCOUNTER — Ambulatory Visit
Admission: RE | Admit: 2014-04-09 | Discharge: 2014-04-09 | Disposition: A | Discharge status: Home / Self Care | Payer: Medicare PPO | Source: Ambulatory Visit | Attending: Family Medicine | Admitting: Family Medicine

## 2014-04-09 ENCOUNTER — Encounter (INDEPENDENT_AMBULATORY_CARE_PROVIDER_SITE_OTHER): Payer: Self-pay

## 2014-04-09 DIAGNOSIS — N6452 Nipple discharge: Secondary | ICD-10-CM

## 2014-04-09 DIAGNOSIS — Z1231 Encounter for screening mammogram for malignant neoplasm of breast: Secondary | ICD-10-CM

## 2014-04-10 ENCOUNTER — Ambulatory Visit (AMBULATORY_SURGERY_CENTER): Payer: Self-pay

## 2014-04-10 VITALS — Ht 60.03 in | Wt 248.4 lb

## 2014-04-10 DIAGNOSIS — Z86010 Personal history of colonic polyps: Secondary | ICD-10-CM

## 2014-04-10 MED ORDER — NA SULFATE-K SULFATE-MG SULF 17.5-3.13-1.6 GM/177ML PO SOLN: ORAL | Status: DC

## 2014-04-10 NOTE — Progress Notes (Signed)
Per pt, no allergies to egg products,but does not tolerate soy products.Pt not taking any weight loss meds or using  O2 at home.

## 2014-04-11 ENCOUNTER — Ambulatory Visit (INDEPENDENT_AMBULATORY_CARE_PROVIDER_SITE_OTHER): Payer: Medicare PPO | Admitting: Internal Medicine

## 2014-04-11 ENCOUNTER — Ambulatory Visit: Payer: Medicare Other | Admitting: Family Medicine

## 2014-04-11 VITALS — BP 146/78 | HR 66 | Temp 98.1°F | Resp 16 | Ht 60.25 in | Wt 247.0 lb

## 2014-04-11 DIAGNOSIS — IMO0001 Reserved for inherently not codable concepts without codable children: Secondary | ICD-10-CM

## 2014-04-11 DIAGNOSIS — I10 Essential (primary) hypertension: Secondary | ICD-10-CM

## 2014-04-11 DIAGNOSIS — N6452 Nipple discharge: Secondary | ICD-10-CM

## 2014-04-11 DIAGNOSIS — E1165 Type 2 diabetes mellitus with hyperglycemia: Secondary | ICD-10-CM

## 2014-04-11 DIAGNOSIS — Z23 Encounter for immunization: Secondary | ICD-10-CM

## 2014-04-11 NOTE — Progress Notes (Signed)
Patient ID: Erin House, female   DOB: 08/07/1950, 63 y.o.   MRN: [id number]   Erin House, is a 63 y.o. female  IRS:[id number]  [id number]:[id number]  DOB - 1951/04/14  CC:  Chief Complaint  Patient presents with  . Follow-up       HPI: Erin House is a 63 y.o. female here today for follow up on diabetes and HTN. She has not been checking her blood sugars but states that she has not had any symptoms of hypoglycemia. Her BP is elevated here today but she reports that she has been taking her BP at home and it is usually 130's/70-75. A repeat BP during her hospital visit was 146/78.  She also has been having nipple discharge in a setitng of previous breast cancer. She had  Mammography performed on 04/09/2014 and has an aspiration biopsy scheduled on 04/30/2014.  Pt also has a diagnosis of OSA and uses a CPAP but has not been re-evaluated in 10 years.  Patient has No headache, No chest pain, No abdominal pain - No Nausea, No new weakness tingling or numbness, No Cough - SOB.  No Known Allergies Past Medical History  Diagnosis Date  . Diabetes mellitus without complication   . Hyperlipemia   . Hypertension   . 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 lisinopril-hydrochlorothiazide (ZESTORETIC) 20-12.5 MG per tablet Take 1 tablet by mouth daily. 30 tablet 2  . naproxen (NAPROSYN) 500 MG tablet Take 1 tablet (500 mg total) by mouth 2 (two) times daily as needed. 30 tablet 0  . rosuvastatin (CRESTOR) 20 MG tablet Take 10 mg by mouth daily.     . sitaGLIPtin-metformin (JANUMET) 50-500 MG per tablet Take 1 tablet by mouth 2 (two) times daily with a meal. 180 tablet 0  . Vitamin D, Ergocalciferol, (DRISDOL) 50000 UNITS CAPS capsule Take 50,000 Units by mouth every 7 (seven) days.    Marland Kitchen glipiZIDE (GLUCOTROL XL) 10 MG 24 hr tablet Take 20 mg by mouth daily.    Marland Kitchen HYDROcodone-acetaminophen (NORCO/VICODIN)  5-325 MG per tablet Take 1-2 tablets every 6 hours as needed for severe pain 10 tablet 0  . lisinopril (PRINIVIL,ZESTRIL) 10 MG tablet Take 10 mg by mouth daily.    . Na Sulfate-K Sulfate-Mg Sulf (SUPREP BOWEL PREP) SOLN Suprep as directed / no substitutions 354 mL 0   No current facility-administered medications on file prior to visit.   No family history on file. History   Social History  . Marital Status: Married    Spouse Name: N/A    Number of Children: N/A  . Years of Education: N/A   Occupational History  . Not on file.   Social History Main Topics  . Smoking status: Former Smoker    Quit date: 05/24/1993  . Smokeless tobacco: Never Used  . Alcohol Use: No     Comment: occasionlly  . Drug Use: No  . Sexual Activity: Not on file   Other Topics Concern  . Not on file   Social History Narrative    Review of Systems: Constitutional: Negative for fever, chills, diaphoresis, activity change, appetite change and fatigue. HENT: Negative for ear pain, nosebleeds, congestion, facial swelling, rhinorrhea, neck pain, neck stiffness and ear discharge.  Eyes: Negative for pain, discharge, redness, itching and visual disturbance. Respiratory: Negative for cough, choking, chest tightness, shortness of breath, wheezing and stridor.  Cardiovascular: Negative for chest  pain, palpitations and leg swelling. Gastrointestinal: Negative for abdominal distention. Genitourinary: Negative for dysuria, urgency, frequency, hematuria, flank pain, decreased urine volume, difficulty urinating and dyspareunia.  Musculoskeletal: Negative for back pain, joint swelling, arthralgia and gait problem. Neurological: Negative for dizziness, tremors, seizures, syncope, facial asymmetry, speech difficulty, weakness, light-headedness, numbness and headaches.  Hematological: Negative for adenopathy. Does not bruise/bleed easily. Psychiatric/Behavioral: Negative for hallucinations, behavioral problems,  confusion, dysphoric mood, decreased concentration and agitation.    Objective:    Filed Vitals:   04/11/14 1318  BP: 150/70  Pulse: 66  Temp: 98.1 F (36.7 C)  Resp: 16    Physical Exam: Constitutional: Patient is obese, well-developed and well-nourished. No distress. HENT: Normocephalic, atraumatic, External right and left ear normal. Oropharynx is clear and moist.  Eyes: Conjunctivae and EOM are normal. PERRLA, no scleral icterus. Neck: Normal ROM. Neck supple. No JVD. No tracheal deviation. No thyromegaly. CVS: RRR, S1/S2 +, no murmurs, no gallops, no carotid bruit.  Pulmonary: Effort and breath sounds normal, no stridor, rhonchi, wheezes, rales.  Abdominal: Soft. BS +, no distension, tenderness, rebound or guarding.  Musculoskeletal: Normal range of motion. No edema and no tenderness.  Lymphadenopathy: No lymphadenopathy noted, cervical, inguinal or axillary Neuro: Alert. Normal reflexes, muscle tone coordination. No cranial nerve deficit. Skin: Skin is warm and dry. No rash noted. Not diaphoretic. No erythema. No pallor. Pt has an area of hypopigmentation just posterior and inferior to the right ear. (birthmark) Psychiatric: Normal mood and affect. Behavior, judgment, thought content normal.  No results found for: WBC, HGB, HCT, MCV, PLT Lab Results  Component Value Date   CREATININE 0.56 01/15/2014   BUN 16 01/15/2014   NA 138 01/15/2014   K 4.3 01/15/2014   CL 102 01/15/2014   CO2 25 01/15/2014    Lab Results  Component Value Date   HGBA1C 7.7* 04/04/2014   Lipid Panel     Component Value Date/Time   CHOL 169 01/15/2014 0907   TRIG 140 01/15/2014 0907   HDL 45 01/15/2014 0907   CHOLHDL 3.8 01/15/2014 0907   VLDL 28 01/15/2014 0907   LDLCALC 96 01/15/2014 0907       Assessment and plan:   1. Immunization due - Pneumococcal polysaccharide vaccine 23-valent greater than or equal to 2yo subcutaneous/IM - Varicella-zoster vaccine subcutaneous  2.  Uncontrolled diabetes mellitus type 2 without complications - She has not been monitoring her BS. I have asked patient to monitor BS and call office if BS are consistently above 200. - Microalbumin, urine - Diabetic Education  3. Essential hypertension - Pt insists that  Ambulatory BP is usually WNL's. She is resistant to increase in Hypertensive agents. I have asked her to monitor BP and bring in BP cuff to check titration against the BP cuff in the office.  4. Obesity, morbid - Nutrition Referral  5. Nipple discharge - Pt has had a nipple discharge in the setting of previous history of breast cancer. I will check a prolactin level to evaluate for intracranial issues causes of nipple discharge. Pt had mammogram and is to have a biopsy tomorrow. - Prolactin   Return for Annual Physical, Blood Pressure, DM II, HTN.  The patient was given clear instructions to go to ER or return to medical center if symptoms don't improve, worsen or new problems develop. The patient verbalized understanding. The patient was told to call to get lab results if they haven't heard anything in the next week.     This  note has been created with Surveyor, quantity. Any transcriptional errors are unintentional.    MATTHEWS,MICHELLE A., MD [place], [place]   04/11/2014, 2:15 PM

## 2014-04-12 LAB — PROLACTIN: Prolactin: 4.5 ng/mL

## 2014-04-12 LAB — MICROALBUMIN, URINE: MICROALB UR: 1.7 mg/dL (ref <2.0)

## 2014-04-23 ENCOUNTER — Other Ambulatory Visit: Payer: Medicare PPO

## 2014-04-24 ENCOUNTER — Other Ambulatory Visit: Payer: Self-pay | Admitting: Family Medicine

## 2014-04-24 DIAGNOSIS — Z1231 Encounter for screening mammogram for malignant neoplasm of breast: Secondary | ICD-10-CM

## 2014-04-25 ENCOUNTER — Encounter: Payer: Medicare Other | Admitting: Gastroenterology

## 2014-04-29 ENCOUNTER — Ambulatory Visit
Admission: RE | Admit: 2014-04-29 | Discharge: 2014-04-29 | Disposition: A | Discharge status: Home / Self Care | Payer: Medicare PPO | Source: Ambulatory Visit | Attending: Family Medicine | Admitting: Family Medicine

## 2014-04-29 DIAGNOSIS — Z1231 Encounter for screening mammogram for malignant neoplasm of breast: Secondary | ICD-10-CM

## 2014-04-29 HISTORY — PX: BREAST BIOPSY: SHX20

## 2014-04-30 ENCOUNTER — Encounter: Payer: Self-pay | Admitting: Gastroenterology

## 2014-04-30 ENCOUNTER — Ambulatory Visit (AMBULATORY_SURGERY_CENTER): Payer: Medicare PPO | Admitting: Gastroenterology

## 2014-04-30 VITALS — BP 130/72 | HR 65 | Temp 98.0°F | Resp 15

## 2014-04-30 DIAGNOSIS — Z86010 Personal history of colonic polyps: Secondary | ICD-10-CM

## 2014-04-30 DIAGNOSIS — K5730 Diverticulosis of large intestine without perforation or abscess without bleeding: Secondary | ICD-10-CM

## 2014-04-30 DIAGNOSIS — Z1211 Encounter for screening for malignant neoplasm of colon: Secondary | ICD-10-CM

## 2014-04-30 MED ORDER — SODIUM CHLORIDE 0.9 % IV SOLN: 500.0000 mL | INTRAVENOUS | Status: DC

## 2014-04-30 NOTE — Progress Notes (Signed)
A/ox3 pleased with MAC, report to Celia RN 

## 2014-04-30 NOTE — Patient Instructions (Signed)
Discharge instructions given. Handouts on diverticulosis and a high fiber diet. Resume previous medications. YOU HAD AN ENDOSCOPIC PROCEDURE TODAY AT THE [facility]: Refer to the procedure report that was given to you for any specific questions about what was found during the examination.  If the procedure report does not answer your questions, please call your gastroenterologist to clarify.  If you requested that your care partner not be given the details of your procedure findings, then the procedure report has been included in a sealed envelope for you to review at your convenience later.  YOU SHOULD EXPECT: Some feelings of bloating in the abdomen. Passage of more gas than usual.  Walking can help get rid of the air that was put into your GI tract during the procedure and reduce the bloating. If you had a lower endoscopy (such as a colonoscopy or flexible sigmoidoscopy) you may notice spotting of blood in your stool or on the toilet paper. If you underwent a bowel prep for your procedure, then you may not have a normal bowel movement for a few days.  DIET: Your first meal following the procedure should be a light meal and then it is ok to progress to your normal diet.  A half-sandwich or bowl of soup is an example of a good first meal.  Heavy or fried foods are harder to digest and may make you feel nauseous or bloated.  Likewise meals heavy in dairy and vegetables can cause extra gas to form and this can also increase the bloating.  Drink plenty of fluids but you should avoid alcoholic beverages for 24 hours.  ACTIVITY: Your care partner should take you home directly after the procedure.  You should plan to take it easy, moving slowly for the rest of the day.  You can resume normal activity the day after the procedure however you should NOT DRIVE or use heavy machinery for 24 hours (because of the sedation medicines used during the test).    SYMPTOMS TO REPORT IMMEDIATELY: A  gastroenterologist can be reached at any hour.  During normal business hours, 8:30 AM to 5:00 PM Monday through Friday, call [phone].  After hours and on weekends, please call the GI answering service at [phone] who will take a message and have the physician on call contact you.   Following lower endoscopy (colonoscopy or flexible sigmoidoscopy):  Excessive amounts of blood in the stool  Significant tenderness or worsening of abdominal pains  Swelling of the abdomen that is new, acute  Fever of 100F or higher  FOLLOW UP: If any biopsies were taken you will be contacted by phone or by letter within the next 1-3 weeks.  Call your gastroenterologist if you have not heard about the biopsies in 3 weeks.  Our staff will call the home number listed on your records the next business day following your procedure to check on you and address any questions or concerns that you may have at that time regarding the information given to you following your procedure. This is a courtesy call and so if there is no answer at the home number and we have not heard from you through the emergency physician on call, we will assume that you have returned to your regular daily activities without incident.  SIGNATURES/CONFIDENTIALITY: You and/or your care partner have signed paperwork which will be entered into your electronic medical record.  These signatures attest to the fact that that the information above on your After Visit Summary   has been reviewed and is understood.  Full responsibility of the confidentiality of this discharge information lies with you and/or your care-partner. 

## 2014-05-01 ENCOUNTER — Telehealth: Payer: Self-pay

## 2014-05-01 NOTE — Op Note (Signed)
[facility]  [employer]. [place], [id number]   COLONOSCOPY PROCEDURE REPORT  PATIENT: Erin House, Erin House  MR#: [id number] BIRTHDATE: February 22, 1951 , 63  yrs. old GENDER: female ENDOSCOPIST: Inda Castle, MD REFERRED BY: PROCEDURE DATE:  04/30/2014 PROCEDURE:   Colonoscopy, diagnostic First Screening Colonoscopy - Avg.  risk and is 50 yrs.  old or older - No.  Prior Negative Screening - Now for repeat screening. N/A  History of Adenoma - Now for follow-up colonoscopy & has been > or = to 3 yrs.  Yes hx of adenoma.  Has been 3 or more years since last colonoscopy.  Polyps Removed Today? No.  Recommend repeat exam, <10 yrs? No. ASA CLASS:   Class II INDICATIONS:high risk personal history of colonic polyps.  2009 MEDICATIONS: Monitored anesthesia care and Propofol 200 mg IV  DESCRIPTION OF PROCEDURE:   After the risks benefits and alternatives of the procedure were thoroughly explained, informed consent was obtained.  The digital rectal exam revealed no abnormalities of the rectum.   The LB [id number]-[id number]  endoscope was introduced through the anus and advanced to the cecum, which was identified by both the appendix and ileocecal valve. No adverse events experienced.   The quality of the prep was excellent using Suprep  The instrument was then slowly withdrawn as the colon was fully examined.      COLON FINDINGS: There was moderate diverticulosis noted in the descending colon and sigmoid colon.   The examination was otherwise normal.  Retroflexed views revealed no abnormalities. The time to cecum=2 minutes 29 seconds.  Withdrawal time=8 minutes 01 seconds. The scope was withdrawn and the procedure completed. COMPLICATIONS: There were no immediate complications.  ENDOSCOPIC IMPRESSION: 1.   Moderate diverticulosis was noted in the descending colon and sigmoid colon 2.   The examination was otherwise normal  RECOMMENDATIONS: Colonoscopy 10  years  eSigned:  Inda Castle, MD 04/30/2014 11:07 AM   cc: Verlan Friends, MD

## 2014-05-01 NOTE — Telephone Encounter (Signed)
Left message on answering machine. 

## 2014-05-06 ENCOUNTER — Ambulatory Visit: Payer: Medicare PPO

## 2014-05-06 VITALS — BP 154/72

## 2014-05-06 DIAGNOSIS — I10 Essential (primary) hypertension: Secondary | ICD-10-CM

## 2014-05-13 ENCOUNTER — Encounter (INDEPENDENT_AMBULATORY_CARE_PROVIDER_SITE_OTHER): Payer: Self-pay | Admitting: General Surgery

## 2014-05-13 NOTE — Progress Notes (Signed)
Patient ID: Erin House, female   DOB: 11-07-1950, 63 y.o.   MRN: [id number]  [place] 05/13/2014 1:51 PM Location: [facility] Patient #: [id number] DOB: 1951-05-11 Married / Language: English / Race: Black or African American Female History of Present Illness Erin Hollingshead MD; 05/13/2014 2:34 PM) Patient words: eval breast.  The patient is a 63 year old female    Note:She is referred by Dr. Pamelia Hoit for further evaluation and treatment of a right breast intraductal papilloma. She has a personal history of left breast cancer and is status post mastectomy 15 years ago. She underwent chemotherapy and a short course of tamoxifen (could not tolerate it because of side effects). For the past 10 months she's had some clear nipple discharge. She underwent imaging studies which demonstrated dilated ducts and the irregular area in the retroareolar area on the right side. Image guided biopsy was consistent with intraductal papilloma. Wider excision was recommended. She now presents for that. She had a previous right breast biopsy that was benign.  Other Problems Erin House, CMA; 05/13/2014 1:51 PM) Arthritis Breast Cancer Cholelithiasis Diabetes Mellitus High blood pressure Hypercholesterolemia Sleep Apnea  Past Surgical History Erin House, CMA; 05/13/2014 1:51 PM) Breast Biopsy Right. Colon Polyp Removal - Colonoscopy Colon Polyp Removal - Open Gallbladder Surgery - Open Mastectomy Left. Oral Surgery Shoulder Surgery Left.  Diagnostic Studies History Erin House, CMA; 05/13/2014 1:51 PM) Colonoscopy within last year Mammogram within last year  Allergies Erin House, [place]; 05/13/2014 1:52 PM) No Known Drug Allergies 05/13/2014  Medication History (Erin House, CMA; 05/13/2014 1:52 PM) Crestor (10MG  Tablet, Oral) Active. Janumet (50-500MG  Tablet, Oral) Active. Lisinopril (10MG  Tablet, Oral)  Active. Lisinopril-Hydrochlorothiazide (20-12.5MG  Tablet, Oral) Active. Vitamin D (Ergocalciferol) (50000UNIT Capsule, Oral) Active.  Social History Erin House, [place]; 05/13/2014 1:51 PM) Alcohol use Occasional alcohol use. Caffeine use Carbonated beverages, Coffee, Tea. No drug use Tobacco use Former smoker.  Family History Erin House, CMA; 05/13/2014 1:51 PM) Diabetes Mellitus Family Members In General. Heart Disease Family Members In General. Hypertension Family Members In General.  Pregnancy / Birth History Erin House, [place]; 05/13/2014 1:51 PM) Age at menarche 56 years. Age of menopause 53-50 Gravida 13 Maternal age 17-20 Para 2     Review of Systems ([place]; 05/13/2014 1:51 PM) General Not Present- Appetite Loss, Chills, Fatigue, Fever, Night Sweats, Weight Gain and Weight Loss. Skin Not Present- Change in Wart/Mole, Dryness, Hives, Jaundice, New Lesions, Non-Healing Wounds, Rash and Ulcer. HEENT Present- Wears glasses/contact lenses. Not Present- Earache, Hearing Loss, Hoarseness, Nose Bleed, Oral Ulcers, Ringing in the Ears, Seasonal Allergies, Sinus Pain, Sore Throat, Visual Disturbances and Yellow Eyes. Respiratory Present- Snoring. Not Present- Bloody sputum, Chronic Cough, Difficulty Breathing and Wheezing. Breast Present- Nipple Discharge. Not Present- Breast Mass, Breast Pain and Skin Changes. Cardiovascular Present- Swelling of Extremities. Not Present- Chest Pain, Difficulty Breathing Lying Down, Leg Cramps, Palpitations, Rapid Heart Rate and Shortness of Breath. Gastrointestinal Not Present- Abdominal Pain, Bloating, Bloody Stool, Change in Bowel Habits, Chronic diarrhea, Constipation, Difficulty Swallowing, Excessive gas, Gets full quickly at meals, Hemorrhoids, Indigestion, Nausea, Rectal Pain and Vomiting. Female Genitourinary Not Present- Frequency, Nocturia, Painful Urination, Pelvic Pain and Urgency. Musculoskeletal Present- Joint Pain.  Not Present- Back Pain, Joint Stiffness, Muscle Pain, Muscle Weakness and Swelling of Extremities. Neurological Not Present- Decreased Memory, Fainting, Headaches, Numbness, Seizures, Tingling, Tremor, Trouble walking and Weakness. Psychiatric Not Present- Anxiety, Bipolar, Change in Sleep Pattern, Depression, Fearful and Frequent crying. Endocrine Present- New  Diabetes. Not Present- Cold Intolerance, Excessive Hunger, Hair Changes, Heat Intolerance and Hot flashes. Hematology Not Present- Easy Bruising, Excessive bleeding, Gland problems, HIV and Persistent Infections.  Vitals (Erin House CMA; 05/13/2014 1:52 PM) 05/13/2014 1:52 PM Weight: 243 lb Height: 60in Body Surface Area: 2.16 m Body Mass Index: 47.46 kg/m Temp.: 21F(Temporal)  Pulse: 79 (Regular)  BP: 134/72 (Sitting, Left Arm, Standard)     Physical Exam Erin Hollingshead MD; 05/13/2014 2:38 PM)  The physical exam findings are as follows: Note:General: Overweight female in NAD. Pleasant and cooperative.  HEENT: [place]/AT, no facial masses  NECK: Supple, no obvious mass or thyroid enlargement.  CV: RRR, no murmur, no JVD.  CHEST: Breath sounds equal and clear. Respirations nonlabored.  BREASTS: Left breast is abscent; left chest wall scar with no nodularity. Right breast demonstrates no palpable masses.  ABDOMEN: Soft, nontender, nondistended, no masses, small hypertrophic scars.  LYMPHATIC: No palpable cervical, supraclavicular, axillary adenopathy.  NEUROLOGIC: Alert and oriented, answers questions appropriately.  PSYCHIATRIC: Normal mood, affect , and behavior.    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  Plan: Wide excision of right breast intraductal papilloma after radioactive seed localization. I have explained the procedure, risks, and aftercare to  her. Risks include but are not limited to bleeding, infection, wound problems, seroma formation, anesthesia. She seems to [facility] and agrees with the plan.  Current Plans Schedule for Surgery  Erin Confer, MD

## 2014-05-27 ENCOUNTER — Other Ambulatory Visit (INDEPENDENT_AMBULATORY_CARE_PROVIDER_SITE_OTHER): Payer: Self-pay | Admitting: General Surgery

## 2014-05-27 DIAGNOSIS — D241 Benign neoplasm of right breast: Secondary | ICD-10-CM

## 2014-06-03 ENCOUNTER — Encounter (HOSPITAL_BASED_OUTPATIENT_CLINIC_OR_DEPARTMENT_OTHER): Payer: Self-pay | Admitting: *Deleted

## 2014-06-10 ENCOUNTER — Other Ambulatory Visit: Payer: Self-pay

## 2014-06-10 ENCOUNTER — Encounter (HOSPITAL_BASED_OUTPATIENT_CLINIC_OR_DEPARTMENT_OTHER)
Admission: RE | Admit: 2014-06-10 | Discharge: 2014-06-10 | Disposition: A | Discharge status: Home / Self Care | Payer: Medicare PPO | Source: Ambulatory Visit | Attending: General Surgery | Admitting: General Surgery

## 2014-06-10 DIAGNOSIS — E780 Pure hypercholesterolemia: Secondary | ICD-10-CM | POA: Diagnosis not present

## 2014-06-10 DIAGNOSIS — I10 Essential (primary) hypertension: Secondary | ICD-10-CM | POA: Diagnosis not present

## 2014-06-10 DIAGNOSIS — Z01818 Encounter for other preprocedural examination: Secondary | ICD-10-CM | POA: Insufficient documentation

## 2014-06-10 DIAGNOSIS — Z853 Personal history of malignant neoplasm of breast: Secondary | ICD-10-CM | POA: Diagnosis not present

## 2014-06-10 DIAGNOSIS — Z87891 Personal history of nicotine dependence: Secondary | ICD-10-CM | POA: Diagnosis not present

## 2014-06-10 DIAGNOSIS — E119 Type 2 diabetes mellitus without complications: Secondary | ICD-10-CM | POA: Diagnosis not present

## 2014-06-10 DIAGNOSIS — D241 Benign neoplasm of right breast: Secondary | ICD-10-CM | POA: Diagnosis present

## 2014-06-10 DIAGNOSIS — Z9012 Acquired absence of left breast and nipple: Secondary | ICD-10-CM | POA: Diagnosis not present

## 2014-06-10 LAB — COMPREHENSIVE METABOLIC PANEL
ALK PHOS: 62 U/L (ref 39–117)
ALT: 24 U/L (ref 0–35)
AST: 20 U/L (ref 0–37)
Albumin: 3.7 g/dL (ref 3.5–5.2)
Anion gap: 6 (ref 5–15)
BILIRUBIN TOTAL: 0.9 mg/dL (ref 0.3–1.2)
BUN: 14 mg/dL (ref 6–23)
CO2: 31 mmol/L (ref 19–32)
CREATININE: 0.66 mg/dL (ref 0.50–1.10)
Calcium: 9.5 mg/dL (ref 8.4–10.5)
Chloride: 99 mEq/L (ref 96–112)
GFR calc Af Amer: >90 mL/min (ref >90)
GFR calc non Af Amer: >90 mL/min (ref >90)
Glucose, Bld: 151 mg/dL — ABNORMAL HIGH (ref 70–99)
Potassium: 3.6 mmol/L (ref 3.5–5.1)
Sodium: 136 mmol/L (ref 135–145)
Total Protein: 6.9 g/dL (ref 6.0–8.3)

## 2014-06-10 LAB — CBC WITH DIFFERENTIAL/PLATELET
Basophils Absolute: 0 10*3/uL (ref 0.0–0.1)
Basophils Relative: 0 % (ref 0–1)
Eosinophils Absolute: 0.2 10*3/uL (ref 0.0–0.7)
Eosinophils Relative: 3 % (ref 0–5)
HEMATOCRIT: 36.7 % (ref 36.0–46.0)
HEMOGLOBIN: 11.2 g/dL — AB (ref 12.0–15.0)
LYMPHS ABS: 2.2 10*3/uL (ref 0.7–4.0)
Lymphocytes Relative: 37 % (ref 12–46)
MCH: 23.9 pg — ABNORMAL LOW (ref 26.0–34.0)
MCHC: 30.5 g/dL (ref 30.0–36.0)
MCV: 78.4 fL (ref 78.0–100.0)
MONOS PCT: 8 % (ref 3–12)
Monocytes Absolute: 0.5 10*3/uL (ref 0.1–1.0)
NEUTROS ABS: 3.1 10*3/uL (ref 1.7–7.7)
NEUTROS PCT: 52 % (ref 43–77)
Platelets: 167 10*3/uL (ref 150–400)
RBC: 4.68 MIL/uL (ref 3.87–5.11)
RDW: 15.3 % (ref 11.5–15.5)
WBC: 6 10*3/uL (ref 4.0–10.5)

## 2014-06-10 LAB — PROTIME-INR
INR: 0.96 (ref 0.00–1.49)
Prothrombin Time: 12.9 seconds (ref 11.6–15.2)

## 2014-06-11 ENCOUNTER — Ambulatory Visit
Admission: RE | Admit: 2014-06-11 | Discharge: 2014-06-11 | Disposition: A | Discharge status: Home / Self Care | Payer: Medicare Other | Source: Ambulatory Visit | Attending: General Surgery | Admitting: General Surgery

## 2014-06-11 ENCOUNTER — Ambulatory Visit (HOSPITAL_BASED_OUTPATIENT_CLINIC_OR_DEPARTMENT_OTHER): Payer: Medicare Other | Admitting: Certified Registered"

## 2014-06-11 ENCOUNTER — Ambulatory Visit (HOSPITAL_BASED_OUTPATIENT_CLINIC_OR_DEPARTMENT_OTHER)
Admission: RE | Admit: 2014-06-11 | Discharge: 2014-06-11 | Disposition: A | Discharge status: Home / Self Care | Payer: Medicare Other | Source: Ambulatory Visit | Attending: General Surgery | Admitting: General Surgery

## 2014-06-11 ENCOUNTER — Encounter (HOSPITAL_BASED_OUTPATIENT_CLINIC_OR_DEPARTMENT_OTHER)
Admission: RE | Disposition: A | Discharge status: Home / Self Care | Payer: Self-pay | Source: Ambulatory Visit | Attending: General Surgery

## 2014-06-11 ENCOUNTER — Encounter (HOSPITAL_BASED_OUTPATIENT_CLINIC_OR_DEPARTMENT_OTHER): Payer: Self-pay | Admitting: Certified Registered"

## 2014-06-11 DIAGNOSIS — I10 Essential (primary) hypertension: Secondary | ICD-10-CM | POA: Diagnosis not present

## 2014-06-11 DIAGNOSIS — D241 Benign neoplasm of right breast: Secondary | ICD-10-CM

## 2014-06-11 DIAGNOSIS — E780 Pure hypercholesterolemia: Secondary | ICD-10-CM | POA: Insufficient documentation

## 2014-06-11 DIAGNOSIS — Z87891 Personal history of nicotine dependence: Secondary | ICD-10-CM | POA: Insufficient documentation

## 2014-06-11 DIAGNOSIS — Z9012 Acquired absence of left breast and nipple: Secondary | ICD-10-CM | POA: Insufficient documentation

## 2014-06-11 DIAGNOSIS — E119 Type 2 diabetes mellitus without complications: Secondary | ICD-10-CM | POA: Insufficient documentation

## 2014-06-11 DIAGNOSIS — Z853 Personal history of malignant neoplasm of breast: Secondary | ICD-10-CM | POA: Insufficient documentation

## 2014-06-11 HISTORY — DX: Sleep apnea, unspecified: G47.30

## 2014-06-11 HISTORY — PX: BREAST LUMPECTOMY WITH RADIOACTIVE SEED LOCALIZATION: SHX6424

## 2014-06-11 HISTORY — DX: Unspecified osteoarthritis, unspecified site: M19.90

## 2014-06-11 LAB — GLUCOSE, CAPILLARY
Glucose-Capillary: 109 mg/dL — ABNORMAL HIGH (ref 70–99)
Glucose-Capillary: 121 mg/dL — ABNORMAL HIGH (ref 70–99)

## 2014-06-11 SURGERY — BREAST LUMPECTOMY WITH RADIOACTIVE SEED LOCALIZATION
Anesthesia: General | Site: Breast | Laterality: Right

## 2014-06-11 MED ORDER — SODIUM BICARBONATE 4 % IV SOLN
INTRAVENOUS | Status: AC
  Filled 2014-06-11: qty 5

## 2014-06-11 MED ORDER — PROPOFOL 10 MG/ML IV EMUL
INTRAVENOUS | Status: AC
  Filled 2014-06-11: qty 50

## 2014-06-11 MED ORDER — OXYCODONE HCL 5 MG PO TABS: 5.0000 mg | ORAL_TABLET | Freq: Once | ORAL | Status: DC | PRN

## 2014-06-11 MED ORDER — ONDANSETRON HCL 4 MG/2ML IJ SOLN: 4.0000 mg | Freq: Once | INTRAMUSCULAR | Status: DC | PRN

## 2014-06-11 MED ORDER — OXYCODONE HCL 5 MG/5ML PO SOLN: 5.0000 mg | Freq: Once | ORAL | Status: DC | PRN

## 2014-06-11 MED ORDER — LIDOCAINE HCL (PF) 1 % IJ SOLN
INTRAMUSCULAR | Status: AC
  Filled 2014-06-11: qty 30

## 2014-06-11 MED ORDER — DEXAMETHASONE SODIUM PHOSPHATE 4 MG/ML IJ SOLN
INTRAMUSCULAR | Status: DC | PRN
  Administered 2014-06-11: 4 mg via INTRAVENOUS

## 2014-06-11 MED ORDER — CEFAZOLIN SODIUM-DEXTROSE 2-3 GM-% IV SOLR
INTRAVENOUS | Status: AC
  Filled 2014-06-11: qty 50

## 2014-06-11 MED ORDER — PROPOFOL 10 MG/ML IV BOLUS
INTRAVENOUS | Status: DC | PRN
  Administered 2014-06-11: 260 mg via INTRAVENOUS

## 2014-06-11 MED ORDER — LACTATED RINGERS IV SOLN
INTRAVENOUS | Status: DC
  Administered 2014-06-11 (×2): via INTRAVENOUS

## 2014-06-11 MED ORDER — FENTANYL CITRATE 0.05 MG/ML IJ SOLN
INTRAMUSCULAR | Status: AC
Start: 2014-06-11 — End: 2014-06-11
  Filled 2014-06-11: qty 6

## 2014-06-11 MED ORDER — OXYCODONE HCL 5 MG PO TABS: 5.0000 mg | ORAL_TABLET | Freq: Four times a day (QID) | ORAL | Status: DC | PRN

## 2014-06-11 MED ORDER — LIDOCAINE HCL (CARDIAC) 20 MG/ML IV SOLN
INTRAVENOUS | Status: DC | PRN
  Administered 2014-06-11: 100 mg via INTRAVENOUS

## 2014-06-11 MED ORDER — MIDAZOLAM HCL 2 MG/2ML IJ SOLN: 1.0000 mg | INTRAMUSCULAR | Status: DC | PRN

## 2014-06-11 MED ORDER — CEFAZOLIN SODIUM-DEXTROSE 2-3 GM-% IV SOLR
2.0000 g | INTRAVENOUS | Status: AC
  Administered 2014-06-11: 2 g via INTRAVENOUS

## 2014-06-11 MED ORDER — MIDAZOLAM HCL 5 MG/5ML IJ SOLN
INTRAMUSCULAR | Status: DC | PRN
  Administered 2014-06-11: 2 mg via INTRAVENOUS

## 2014-06-11 MED ORDER — MIDAZOLAM HCL 2 MG/2ML IJ SOLN
INTRAMUSCULAR | Status: AC
  Filled 2014-06-11: qty 2

## 2014-06-11 MED ORDER — ONDANSETRON HCL 4 MG/2ML IJ SOLN
INTRAMUSCULAR | Status: DC | PRN
  Administered 2014-06-11: 4 mg via INTRAVENOUS

## 2014-06-11 MED ORDER — EPHEDRINE SULFATE 50 MG/ML IJ SOLN
INTRAMUSCULAR | Status: DC | PRN
  Administered 2014-06-11 (×2): 10 mg via INTRAVENOUS
  Administered 2014-06-11: 5 mg via INTRAVENOUS

## 2014-06-11 MED ORDER — FENTANYL CITRATE 0.05 MG/ML IJ SOLN
INTRAMUSCULAR | Status: DC | PRN
  Administered 2014-06-11: 100 ug via INTRAVENOUS

## 2014-06-11 MED ORDER — BUPIVACAINE HCL (PF) 0.5 % IJ SOLN
INTRAMUSCULAR | Status: AC
  Filled 2014-06-11: qty 30

## 2014-06-11 MED ORDER — BUPIVACAINE HCL (PF) 0.5 % IJ SOLN
INTRAMUSCULAR | Status: DC | PRN
  Administered 2014-06-11: 11.5 mL

## 2014-06-11 MED ORDER — FENTANYL CITRATE 0.05 MG/ML IJ SOLN: 50.0000 ug | INTRAMUSCULAR | Status: DC | PRN

## 2014-06-11 SURGICAL SUPPLY — 45 items
APPLIER CLIP 9.375 MED OPEN (MISCELLANEOUS)
BENZOIN TINCTURE PRP APPL 2/3 (GAUZE/BANDAGES/DRESSINGS) ×3 IMPLANT
BINDER BREAST XXLRG (GAUZE/BANDAGES/DRESSINGS) ×3 IMPLANT
BLADE SURG 15 STRL LF DISP TIS (BLADE) ×1 IMPLANT
BLADE SURG 15 STRL SS (BLADE) ×2
CANISTER SUC SOCK COL 7IN (MISCELLANEOUS) IMPLANT
CANISTER SUCT 1200ML W/VALVE (MISCELLANEOUS) IMPLANT
CHLORAPREP W/TINT 26ML (MISCELLANEOUS) ×3 IMPLANT
CLIP APPLIE 9.375 MED OPEN (MISCELLANEOUS) IMPLANT
CLOSURE WOUND 1/2 X4 (GAUZE/BANDAGES/DRESSINGS) ×1
COVER BACK TABLE 60X90IN (DRAPES) ×3 IMPLANT
COVER MAYO STAND STRL (DRAPES) ×3 IMPLANT
COVER PROBE W GEL 5X96 (DRAPES) ×3 IMPLANT
DECANTER SPIKE VIAL GLASS SM (MISCELLANEOUS) IMPLANT
DEVICE DUBIN W/COMP PLATE 8390 (MISCELLANEOUS) ×3 IMPLANT
DRAPE LAPAROTOMY 100X72 PEDS (DRAPES) ×3 IMPLANT
DRAPE UTILITY XL STRL (DRAPES) ×3 IMPLANT
ELECT COATED BLADE 2.86 ST (ELECTRODE) ×3 IMPLANT
ELECT REM PT RETURN 9FT ADLT (ELECTROSURGICAL) ×3
ELECTRODE REM PT RTRN 9FT ADLT (ELECTROSURGICAL) ×1 IMPLANT
GAUZE SPONGE 4X4 12PLY STRL (GAUZE/BANDAGES/DRESSINGS) ×3 IMPLANT
GLOVE BIOGEL PI IND STRL 8.5 (GLOVE) ×1 IMPLANT
GLOVE BIOGEL PI INDICATOR 8.5 (GLOVE) ×2
GLOVE ECLIPSE 8.0 STRL XLNG CF (GLOVE) ×3 IMPLANT
GLOVE EXAM NITRILE MD LF STRL (GLOVE) ×6 IMPLANT
GLOVE SURG SS PI 7.0 STRL IVOR (GLOVE) ×3 IMPLANT
GOWN STRL REUS W/ TWL LRG LVL3 (GOWN DISPOSABLE) ×2 IMPLANT
GOWN STRL REUS W/TWL LRG LVL3 (GOWN DISPOSABLE) ×4
NEEDLE HYPO 25X1 1.5 SAFETY (NEEDLE) ×3 IMPLANT
NS IRRIG 1000ML POUR BTL (IV SOLUTION) ×3 IMPLANT
PACK BASIN DAY SURGERY FS (CUSTOM PROCEDURE TRAY) ×3 IMPLANT
PENCIL BUTTON HOLSTER BLD 10FT (ELECTRODE) ×3 IMPLANT
SLEEVE SCD COMPRESS KNEE MED (MISCELLANEOUS) ×3 IMPLANT
SPONGE GAUZE 4X4 12PLY STER LF (GAUZE/BANDAGES/DRESSINGS) ×3 IMPLANT
SPONGE LAP 4X18 X RAY DECT (DISPOSABLE) ×3 IMPLANT
STRIP CLOSURE SKIN 1/2X4 (GAUZE/BANDAGES/DRESSINGS) ×2 IMPLANT
SUT MON AB 4-0 PC3 18 (SUTURE) ×3 IMPLANT
SUT SILK 2 0 FS (SUTURE) ×3 IMPLANT
SUT VICRYL 3-0 CR8 SH (SUTURE) ×3 IMPLANT
SYR CONTROL 10ML LL (SYRINGE) ×3 IMPLANT
TOWEL OR 17X24 6PK STRL BLUE (TOWEL DISPOSABLE) ×3 IMPLANT
TOWEL OR NON WOVEN STRL DISP B (DISPOSABLE) ×3 IMPLANT
TUBE CONNECTING 20'X1/4 (TUBING) ×1
TUBE CONNECTING 20X1/4 (TUBING) ×2 IMPLANT
YANKAUER SUCT BULB TIP NO VENT (SUCTIONS) ×3 IMPLANT

## 2014-06-11 NOTE — Anesthesia Preprocedure Evaluation (Signed)
Anesthesia Evaluation  Patient identified by MRN, date of birth, ID band Patient awake    Reviewed: Allergy & Precautions, NPO status , Patient's Chart, lab work & pertinent test results  Airway Mallampati: I  TM Distance: >3 FB Neck ROM: Full    Dental  (+) Dental Advisory Given, Upper Dentures, Lower Dentures   Pulmonary sleep apnea and Continuous Positive Airway Pressure Ventilation , former smoker,  breath sounds clear to auscultation        Cardiovascular hypertension, Pt. on medications Rhythm:Regular Rate:Normal     Neuro/Psych    GI/Hepatic   Endo/Other  diabetes, Well Controlled, Type 2, Oral Hypoglycemic AgentsMorbid obesity  Renal/GU      Musculoskeletal   Abdominal   Peds  Hematology   Anesthesia Other Findings   Reproductive/Obstetrics                             Anesthesia Physical Anesthesia Plan  ASA: III  Anesthesia Plan: General   Post-op Pain Management:    Induction: Intravenous  Airway Management Planned: LMA  Additional Equipment:   Intra-op Plan:   Post-operative Plan: Extubation in OR  Informed Consent: I have reviewed the patients History and Physical, chart, labs and discussed the procedure including the risks, benefits and alternatives for the proposed anesthesia with the patient or authorized representative who has indicated his/her understanding and acceptance.   Dental advisory given  Plan Discussed with: CRNA, Anesthesiologist and Surgeon  Anesthesia Plan Comments:         Anesthesia Quick Evaluation

## 2014-06-11 NOTE — Discharge Instructions (Addendum)
[place] Office Phone Number [phone]  BREAST BIOPSY/ PARTIAL MASTECTOMY: POST OP INSTRUCTIONS  Always review your discharge instruction sheet given to you by the facility where your surgery was performed.  IF YOU HAVE DISABILITY OR FAMILY LEAVE FORMS, YOU MUST BRING THEM TO THE OFFICE FOR PROCESSING.  DO NOT GIVE THEM TO YOUR DOCTOR.  1. A prescription for pain medication may be given to you upon discharge.  Take your pain medication as prescribed, if needed.  If narcotic pain medicine is not needed, then you may take acetaminophen (Tylenol) or ibuprofen (Advil) as needed. 2. Take your usually prescribed medications unless otherwise directed 3. If you need a refill on your pain medication, please contact your pharmacy.  They will contact our office to request authorization.  Prescriptions will not be filled after 5pm or on week-ends. 4. You should eat very light the first 24 hours after surgery, such as soup, crackers, pudding, etc.  Resume your normal diet the day after surgery. 5. Most patients will experience some swelling and bruising in the breast.  Ice packs and a good support bra will help.  Swelling and bruising can take several days to resolve.  6. It is common to experience some constipation if taking pain medication after surgery.  Increasing fluid intake and taking a stool softener will usually help or prevent this problem from occurring.  A mild laxative (Milk of Magnesia or Miralax) should be taken according to package directions if there are no bowel movements after 48 hours. 7. Unless discharge instructions indicate otherwise, you may remove your bandages 48 hours after surgery, and you may shower at that time.  You may have steri-strips (small skin tapes) in place directly over the incision.  These strips should be left on the skin.  If your surgeon used skin glue on the incision, you may shower in 24 hours.  The glue will flake off over the next 2-3 weeks.   Any sutures or staples will be removed at the office during your follow-up visit. 8. ACTIVITIES:  You may resume light daily activities (gradually increasing) beginning the next day.  Resume normal activities when you are pain-free. Wearing a good support bra or sports bra minimizes pain and swelling.  You may have sexual intercourse when it is comfortable. a. You may drive when you no longer are taking prescription pain medication, you can comfortably wear a seatbelt, and you can safely maneuver your car and apply brakes. b. RETURN TO WORK:  ______________________________________________________________________________________ 9. You should see your doctor in the office for a follow-up appointment approximately two weeks after your surgery.  10. OTHER INSTRUCTIONS: _______________________________________________________________________________________________ _____________________________________________________________________________________________________________________________________ _____________________________________________________________________________________________________________________________________ _____________________________________________________________________________________________________________________________________  WHEN TO CALL YOUR DOCTOR: 1. Fever over 101.0 2. Nausea and/or vomiting. 3. Extreme swelling or bruising. 4. Continued bleeding from incision. 5. Increased pain, redness, or drainage from the incision.  The clinic staff is available to answer your questions during regular business hours.  Please dont hesitate to call and ask to speak to one of the nurses for clinical concerns.  If you have a medical emergency, go to the nearest emergency room or call 911.  A surgeon from [facility] is always on call at the hospital.  For further questions, please visit [email or web address]    Post Anesthesia Home Care  Instructions  Activity: Get plenty of rest for the remainder of the day. A responsible adult should stay with you for 24 hours following the procedure.  For the next 24 hours, DO NOT: -  Drive a car -Paediatric nurse -Drink alcoholic beverages -Take any medication unless instructed by your physician -Make any legal decisions or sign important papers.  Meals: Start with liquid foods such as gelatin or soup. Progress to regular foods as tolerated. Avoid greasy, spicy, heavy foods. If nausea and/or vomiting occur, drink only clear liquids until the nausea and/or vomiting subsides. Call your physician if vomiting continues.  Special Instructions/Symptoms: Your throat may feel dry or sore from the anesthesia or the breathing tube placed in your throat during surgery. If this causes discomfort, gargle with warm salt water. The discomfort should disappear within 24 hours.

## 2014-06-11 NOTE — Interval H&P Note (Signed)
History and Physical Interval Note:  06/11/2014 12:59 PM  Erin House  has presented today for surgery, with the diagnosis of RIGHT BREAST INDRADUCTAL PAPILLOMA  The various methods of treatment have been discussed with the patient and family. After consideration of risks, benefits and other options for treatment, the patient has consented to  Procedure(s): BREAST LUMPECTOMY WITH RADIOACTIVE SEED LOCALIZATION (Right) as a surgical intervention .  The patient's history has been reviewed, patient examined, no change in status, stable for surgery.  I have reviewed the patient's chart and labs.  Questions were answered to the patient's satisfaction.     Yolonda Purtle Lenna Sciara

## 2014-06-11 NOTE — H&P (View-Only) (Signed)
Patient ID: Erin House, female   DOB: 03/08/1951, 64 y.o.   MRN: [id number]  [place] 05/13/2014 1:51 PM Location: [facility] Patient #: [id number] DOB: [birth year] Married / Language: English / Race: Black or African American Female History of Present Illness Odis Hollingshead MD; 05/13/2014 2:34 PM) Patient words: eval breast.  The patient is a 64 year old female    Note:She is referred by Dr. Pamelia Hoit for further evaluation and treatment of a right breast intraductal papilloma. She has a personal history of left breast cancer and is status post mastectomy 15 years ago. She underwent chemotherapy and a short course of tamoxifen (could not tolerate it because of side effects). For the past 10 months she's had some clear nipple discharge. She underwent imaging studies which demonstrated dilated ducts and the irregular area in the retroareolar area on the right side. Image guided biopsy was consistent with intraductal papilloma. Wider excision was recommended. She now presents for that. She had a previous right breast biopsy that was benign.  Other Problems Marjean Donna, CMA; 05/13/2014 1:51 PM) Arthritis Breast Cancer Cholelithiasis Diabetes Mellitus High blood pressure Hypercholesterolemia Sleep Apnea  Past Surgical History Marjean Donna, CMA; 05/13/2014 1:51 PM) Breast Biopsy Right. Colon Polyp Removal - Colonoscopy Colon Polyp Removal - Open Gallbladder Surgery - Open Mastectomy Left. Oral Surgery Shoulder Surgery Left.  Diagnostic Studies History Marjean Donna, CMA; 05/13/2014 1:51 PM) Colonoscopy within last year Mammogram within last year  Allergies Marjean Donna, [place]; 05/13/2014 1:52 PM) No Known Drug Allergies 05/13/2014  Medication History (Sonya Bynum, CMA; 05/13/2014 1:52 PM) Crestor (10MG  Tablet, Oral) Active. Janumet (50-500MG  Tablet, Oral) Active. Lisinopril (10MG  Tablet, Oral)  Active. Lisinopril-Hydrochlorothiazide (20-12.5MG  Tablet, Oral) Active. Vitamin D (Ergocalciferol) (50000UNIT Capsule, Oral) Active.  Social History Marjean Donna, [place]; 05/13/2014 1:51 PM) Alcohol use Occasional alcohol use. Caffeine use Carbonated beverages, Coffee, Tea. No drug use Tobacco use Former smoker.  Family History Marjean Donna, CMA; 05/13/2014 1:51 PM) Diabetes Mellitus Family Members In General. Heart Disease Family Members In General. Hypertension Family Members In General.  Pregnancy / Birth History Marjean Donna, [place]; 05/13/2014 1:51 PM) Age at menarche 7 years. Age of menopause 12-50 Gravida 105 Maternal age 39-20 Para 2     Review of Systems ([place]; 05/13/2014 1:51 PM) General Not Present- Appetite Loss, Chills, Fatigue, Fever, Night Sweats, Weight Gain and Weight Loss. Skin Not Present- Change in Wart/Mole, Dryness, Hives, Jaundice, New Lesions, Non-Healing Wounds, Rash and Ulcer. HEENT Present- Wears glasses/contact lenses. Not Present- Earache, Hearing Loss, Hoarseness, Nose Bleed, Oral Ulcers, Ringing in the Ears, Seasonal Allergies, Sinus Pain, Sore Throat, Visual Disturbances and Yellow Eyes. Respiratory Present- Snoring. Not Present- Bloody sputum, Chronic Cough, Difficulty Breathing and Wheezing. Breast Present- Nipple Discharge. Not Present- Breast Mass, Breast Pain and Skin Changes. Cardiovascular Present- Swelling of Extremities. Not Present- Chest Pain, Difficulty Breathing Lying Down, Leg Cramps, Palpitations, Rapid Heart Rate and Shortness of Breath. Gastrointestinal Not Present- Abdominal Pain, Bloating, Bloody Stool, Change in Bowel Habits, Chronic diarrhea, Constipation, Difficulty Swallowing, Excessive gas, Gets full quickly at meals, Hemorrhoids, Indigestion, Nausea, Rectal Pain and Vomiting. Female Genitourinary Not Present- Frequency, Nocturia, Painful Urination, Pelvic Pain and Urgency. Musculoskeletal Present- Joint Pain.  Not Present- Back Pain, Joint Stiffness, Muscle Pain, Muscle Weakness and Swelling of Extremities. Neurological Not Present- Decreased Memory, Fainting, Headaches, Numbness, Seizures, Tingling, Tremor, Trouble walking and Weakness. Psychiatric Not Present- Anxiety, Bipolar, Change in Sleep Pattern, Depression, Fearful and Frequent crying. Endocrine Present- New  Diabetes. Not Present- Cold Intolerance, Excessive Hunger, Hair Changes, Heat Intolerance and Hot flashes. Hematology Not Present- Easy Bruising, Excessive bleeding, Gland problems, HIV and Persistent Infections.  Vitals (Sonya Bynum CMA; 05/13/2014 1:52 PM) 05/13/2014 1:52 PM Weight: 243 lb Height: 60in Body Surface Area: 2.16 m Body Mass Index: 47.46 kg/m Temp.: 52F(Temporal)  Pulse: 79 (Regular)  BP: 134/72 (Sitting, Left Arm, Standard)     Physical Exam Odis Hollingshead MD; 05/13/2014 2:38 PM)  The physical exam findings are as follows: Note:General: Overweight female in NAD. Pleasant and cooperative.  HEENT: [place]/AT, no facial masses  NECK: Supple, no obvious mass or thyroid enlargement.  CV: RRR, no murmur, no JVD.  CHEST: Breath sounds equal and clear. Respirations nonlabored.  BREASTS: Left breast is abscent; left chest wall scar with no nodularity. Right breast demonstrates no palpable masses.  ABDOMEN: Soft, nontender, nondistended, no masses, small hypertrophic scars.  LYMPHATIC: No palpable cervical, supraclavicular, axillary adenopathy.  NEUROLOGIC: Alert and oriented, answers questions appropriately.  PSYCHIATRIC: Normal mood, affect , and behavior.    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  Plan: Wide excision of right breast intraductal papilloma after radioactive seed localization. I have explained the procedure, risks, and aftercare to  her. Risks include but are not limited to bleeding, infection, wound problems, seroma formation, anesthesia. She seems to [facility] and agrees with the plan.  Current Plans Schedule for Surgery  Jackolyn Confer, MD

## 2014-06-11 NOTE — Anesthesia Procedure Notes (Signed)
Procedure Name: LMA Insertion Date/Time: 06/11/2014 1:19 PM Performed by: Baxter Flattery Pre-anesthesia Checklist: Patient identified, Emergency Drugs available, Suction available and Patient being monitored Patient Re-evaluated:Patient Re-evaluated prior to inductionOxygen Delivery Method: Circle System Utilized Preoxygenation: Pre-oxygenation with 100% oxygen Intubation Type: IV induction Ventilation: Mask ventilation without difficulty LMA: LMA inserted LMA Size: 4.0 Number of attempts: 1 Airway Equipment and Method: Bite block Placement Confirmation: positive ETCO2 and breath sounds checked- equal and bilateral Tube secured with: Tape Dental Injury: Teeth and Oropharynx as per pre-operative assessment

## 2014-06-11 NOTE — Anesthesia Postprocedure Evaluation (Signed)
  Anesthesia Post-op Note  Patient: Erin House  Procedure(s) Performed: Procedure(s): BREAST LUMPECTOMY WITH RADIOACTIVE SEED LOCALIZATION (Right)  Patient Location: PACU  Anesthesia Type: General   Level of Consciousness: awake, alert  and oriented  Airway and Oxygen Therapy: Patient Spontanous Breathing  Post-op Pain: mild  Post-op Assessment: Post-op Vital signs reviewed  Post-op Vital Signs: Reviewed  Last Vitals:  Filed Vitals:   06/11/14 1540  BP: 137/65  Pulse: 69  Temp: 36.4 C  Resp: 16    Complications: No apparent anesthesia complications

## 2014-06-11 NOTE — Op Note (Signed)
Operative Note  Erin House female 64 y.o. 06/11/2014  PREOPERATIVE DX:  Right breast intraductal papilloma  POSTOPERATIVE DX:  Same  PROCEDURE:   Right breast lumpectomy after radioactive seed localization         Surgeon: Odis Hollingshead   Assistants: none  Anesthesia: General LMA anesthesia  Indications:   This is a 64 year old female who has a personal history of left breast cancer status post mastectomy. She's been having nipple discharge for the past 11 months. Imaging studies demonstrated dilated ducts and an irregular area beneath the right nipple. Image guided biopsy was consistent with intraductal papilloma. Wider excision was recommended and she presents for that now.    Procedure Detail:  She underwent successful radioactive seed localization. She was seen in the holding area. The seed was identified with the neoprobe. My initials were placed on her right breast. She was brought to the operating room placed supine on the operating table and a general anesthetic was given. The right breast was sterilely prepped and draped. A timeout was performed.  Using the neoprobe, the area of the radioactive seed was identified deep to the nipple. Local anesthetic was infiltrated in the circumareolar area from the 9:00 to 3:00 position in a clockwise fashion. An incision was made from the 9:00 to 3:00 position incising the skin and dermis sharply. The nipple was carefully raised up using electrocautery. Using the neoprobe the area of increased counts was identified. Silk silk suture was placed at the anterior margin. Using electrocautery a lumpectomy was performed. The neoprobe guidance the lumpectomy. Margins grossly appeared to be negative. The medial aspect was marked with a suture. Specimen mammogram was performed which showed the radioactive seed in the middle of the specimen, but did not show the clip. On the post-seed placement mammogram the clip appeared to be adjacent to the  seed. Subsequently, I took more tissue inferiorly in the clip was in this tissue. This was oriented. Both specimens were then sent to pathology.  Subsequently, the wound was inspected and bleeding was controlled with electrocautery. Once hemostasis was adequate, the subcutaneous tissue is approximated with interrupted 3-0 Vicryl sutures. The skin is closed with a 4-0 Monocryl subcuticular stitch. Steri-Strips and a sterile dressing were applied. A breast binder was applied.  She tolerated the procedure well without any apparent complications and was taken to the recovery room in satisfactory condition.   Estimated Blood Loss:  100 ml         Specimens: right breast tissue        Complications:  * No complications entered in OR log *         Disposition: PACU - hemodynamically stable.         Condition: stable

## 2014-06-11 NOTE — Transfer of Care (Signed)
Immediate Anesthesia Transfer of Care Note  Patient: Erin House  Procedure(s) Performed: Procedure(s): BREAST LUMPECTOMY WITH RADIOACTIVE SEED LOCALIZATION (Right)  Patient Location: PACU  Anesthesia Type:General  Level of Consciousness: awake, alert  and oriented  Airway & Oxygen Therapy: Patient Spontanous Breathing and Patient connected to face mask oxygen  Post-op Assessment: Report given to PACU RN, Post -op Vital signs reviewed and stable and Patient moving all extremities  Post vital signs: Reviewed and stable  Complications: No apparent anesthesia complications

## 2014-06-12 ENCOUNTER — Encounter (HOSPITAL_BASED_OUTPATIENT_CLINIC_OR_DEPARTMENT_OTHER): Payer: Self-pay | Admitting: General Surgery

## 2014-06-18 ENCOUNTER — Other Ambulatory Visit: Payer: Self-pay

## 2014-06-18 DIAGNOSIS — I10 Essential (primary) hypertension: Secondary | ICD-10-CM

## 2014-06-18 MED ORDER — LISINOPRIL-HYDROCHLOROTHIAZIDE 20-12.5 MG PO TABS: 1.0000 | ORAL_TABLET | Freq: Every day | ORAL | Status: DC

## 2014-06-18 NOTE — Telephone Encounter (Signed)
rx refilled as requested via fax. Thanks!

## 2014-06-24 ENCOUNTER — Other Ambulatory Visit: Payer: Self-pay

## 2014-06-24 DIAGNOSIS — I10 Essential (primary) hypertension: Secondary | ICD-10-CM

## 2014-06-24 MED ORDER — LISINOPRIL-HYDROCHLOROTHIAZIDE 20-12.5 MG PO TABS: 1.0000 | ORAL_TABLET | Freq: Every day | ORAL | Status: DC

## 2014-07-08 ENCOUNTER — Other Ambulatory Visit: Payer: Medicare PPO

## 2014-07-09 ENCOUNTER — Other Ambulatory Visit (INDEPENDENT_AMBULATORY_CARE_PROVIDER_SITE_OTHER): Payer: Medicare Other

## 2014-07-09 ENCOUNTER — Other Ambulatory Visit: Payer: Self-pay | Admitting: Internal Medicine

## 2014-07-09 DIAGNOSIS — E1165 Type 2 diabetes mellitus with hyperglycemia: Secondary | ICD-10-CM

## 2014-07-09 DIAGNOSIS — IMO0002 Reserved for concepts with insufficient information to code with codable children: Secondary | ICD-10-CM

## 2014-07-09 DIAGNOSIS — I10 Essential (primary) hypertension: Secondary | ICD-10-CM

## 2014-07-09 LAB — COMPREHENSIVE METABOLIC PANEL
ALK PHOS: 55 U/L (ref 39–117)
ALT: 23 U/L (ref 0–35)
AST: 18 U/L (ref 0–37)
Albumin: 3.7 g/dL (ref 3.5–5.2)
BILIRUBIN TOTAL: 0.7 mg/dL (ref 0.2–1.2)
BUN: 14 mg/dL (ref 6–23)
CO2: 30 mEq/L (ref 19–32)
Calcium: 9 mg/dL (ref 8.4–10.5)
Chloride: 105 mEq/L (ref 96–112)
Creat: 0.55 mg/dL (ref 0.50–1.10)
GLUCOSE: 103 mg/dL — AB (ref 70–99)
Potassium: 4 mEq/L (ref 3.5–5.3)
Sodium: 142 mEq/L (ref 135–145)
TOTAL PROTEIN: 6.3 g/dL (ref 6.0–8.3)

## 2014-07-09 LAB — HEMOGLOBIN A1C
Hgb A1c MFr Bld: 7.3 % — ABNORMAL HIGH (ref <5.7)
Mean Plasma Glucose: 163 mg/dL — ABNORMAL HIGH (ref <117)

## 2014-07-15 ENCOUNTER — Encounter: Payer: Self-pay | Admitting: Internal Medicine

## 2014-07-15 ENCOUNTER — Ambulatory Visit (INDEPENDENT_AMBULATORY_CARE_PROVIDER_SITE_OTHER): Payer: Medicare Other | Admitting: Internal Medicine

## 2014-07-15 VITALS — BP 144/80 | HR 85 | Temp 97.8°F | Resp 16 | Ht 60.0 in | Wt 246.0 lb

## 2014-07-15 DIAGNOSIS — IMO0002 Reserved for concepts with insufficient information to code with codable children: Secondary | ICD-10-CM

## 2014-07-15 DIAGNOSIS — Z0000 Encounter for general adult medical examination without abnormal findings: Secondary | ICD-10-CM

## 2014-07-15 DIAGNOSIS — E1165 Type 2 diabetes mellitus with hyperglycemia: Secondary | ICD-10-CM

## 2014-07-15 DIAGNOSIS — G4733 Obstructive sleep apnea (adult) (pediatric): Secondary | ICD-10-CM | POA: Insufficient documentation

## 2014-07-15 DIAGNOSIS — Z23 Encounter for immunization: Secondary | ICD-10-CM

## 2014-07-15 DIAGNOSIS — E0843 Diabetes mellitus due to underlying condition with diabetic autonomic (poly)neuropathy: Secondary | ICD-10-CM

## 2014-07-15 DIAGNOSIS — I10 Essential (primary) hypertension: Secondary | ICD-10-CM

## 2014-07-15 MED ORDER — GABAPENTIN 300 MG PO CAPS: ORAL_CAPSULE | ORAL | Status: DC

## 2014-07-15 NOTE — Progress Notes (Unsigned)
Patient ID: Erin House, female   DOB: November 30, 1950, 64 y.o.   MRN: [id number]   ANNUAL CPE  Assessment:   1.  Annual physical exam - Pt had a PAP in 06/2013 next due in 2017 - Will refer to Opthalmology for Glaucoma screen - DG Bone Density; Future - Lipid panel; Future  2. Type II diabetes mellitus, uncontrolled - Pt does not want to change medications at this time. She states that she has a high carbohydrate diet and wants to try to modify her diet before adding or increasing any medications.  - Her foot examiantion shows some decreased sensation in the heel region of both feet. She also has been trimming her nails which shows thickening and she also has some soft tissue overgrowth on the nail of the 4th digit if the left foot. As such will refer to Podiatry as she is at increased risk - Ambulatory referral to Ophthalmology - Ambulatory referral to Podiatry - Referral to Nutrition and Diabetes Services  3. Essential hypertension Pt reports that her ambulatory BP is usually SBP of 130's and DBP of 80. I have discussed that if she continues to have elevated BP will increase Lisinopril to 40 mg.  4. 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 Will start on Neurontin for diabetic Neuropathy. - gabapentin (NEURONTIN) 300 MG capsule; 300 mg PO Daily x 1 day, then 300 mg PO BID x 1 day, then 300 mg PO TID.  Dispense: 90 capsule; Refill: 3   5. Obstructive sleep apnea - Pt has a h/o OSA and had a CPAP which is no longer working. She has not been evaluated for more than 3 years. Will refer for titration study. - Ambulatory referral to Sleep Studies  6. Need for Tdap vaccination - Tdap vaccine greater than or equal to 7yo IM   Plan:   During the course of the visit the patient was educated and counseled about appropriate screening  and preventive services including:    Pneumococcal vaccine   Influenza vaccine  Td vaccine  Screening electrocardiogram  Bone densitometry screening  Colorectal cancer screening  Diabetes screening  Glaucoma screening  Nutrition counseling   Advanced directives: requested  Screening recommendations, referrals: Vaccinations: Please see documentation below and orders this visit.  Nutrition assessed and recommended  Colonoscopy {not indicated/requested/declined:14582} Recommended yearly ophthalmology/optometry visit for glaucoma screening and checkup Recommended yearly dental visit for hygiene and checkup Advanced directives - requested  Conditions/risks identified: BMI: Discussed weight loss, diet, and increase physical activity.  Increase physical activity: AHA recommends 150 minutes of physical activity a week.  Medications reviewed Diabetes {ACTION; IS/IS NWG:95621308} at goal, ACE/ARB therapy: {Ace Inhibitor Therapy:20833} RA- on DMARD*** Urinary Incontinence {ACTION; IS/IS MVH:84696295} an issue: discussed non pharmacology and pharmacology options.  Fall risk: {Desc; low/moderate/high:110033}- discussed PT, home fall assessment, medications.    Subjective:  Erin House is a 64 y.o. female who presents for Medicare Annual Wellness Visit and complete physical.  Date of last medicare wellness visit is unknown.  She has had elevated blood pressure for *** years. Her blood pressure {HAS HAS NOT:18834} been controlled at home, today their BP is BP: (!) 144/80 mmHg She {DOES_DOES MWU:13244} workout. She denies chest pain, shortness of breath, dizziness.  She {ACTION; IS/IS WNU:27253664} on cholesterol medication and denies myalgias. Her cholesterol {ACTION; IS/IS NOT:21021397} at goal. The cholesterol last visit was:  Lab Results  Component Value Date   CHOL 169 01/15/2014   HDL 45 01/15/2014   LDLCALC 96 01/15/2014   TRIG 140 01/15/2014   CHOLHDL 3.8  01/15/2014   She has had diabetes for *** years. She {Has/has not:18111} been working on diet and exercise for ***prediabetes, and denies {Symptoms; diabetes w/o none:19199}. Last A1C in the office was:  Lab Results  Component Value Date   HGBA1C 7.3* 07/09/2014   Patient is on Vitamin D supplement.   No results found for: VD25OH     Names of Other Physician/Practitioners you currently use: 1. [facility] here for primary care 2. ***, eye doctor, last visit *** 3. ***, dentist, last visit *** Patient Care Team: Leana Gamer, MD as PCP - General (Internal Medicine)   Medication Review: Current Outpatient Prescriptions on File Prior to Visit  Medication Sig Dispense Refill  . lisinopril-hydrochlorothiazide (ZESTORETIC) 20-12.5 MG per tablet Take 1 tablet by mouth daily. 30 tablet 2  . naproxen (NAPROSYN) 500 MG tablet Take 1 tablet (500 mg total) by mouth 2 (two) times daily as needed. 30 tablet 0  . oxyCODONE (OXY IR/ROXICODONE) 5 MG immediate release tablet Take 1-2 tablets (5-10 mg total) by mouth every 6 (six) hours as needed for moderate pain, severe pain or breakthrough pain. 30 tablet 0  . rosuvastatin (CRESTOR) 20 MG tablet Take 10 mg by mouth daily.     . sitaGLIPtin-metformin (JANUMET) 50-500 MG per tablet Take 1 tablet by mouth 2 (two) times daily with a meal. 180 tablet 0  . Vitamin D, Ergocalciferol, (DRISDOL) 50000 UNITS CAPS capsule Take 50,000 Units by mouth every 7 (seven) days.     No current facility-administered medications on file prior to visit.    Current Problems (verified) Patient Active Problem List   Diagnosis Date Noted  . Need for immunization against influenza 02/21/2014  . Type II diabetes mellitus, uncontrolled 02/21/2014  . Uncontrolled diabetes mellitus type 2 without complications 73/71/0626  . Obesity, morbid 02/21/2014  . Type II or unspecified type diabetes mellitus without mention of complication, uncontrolled  01/17/2014  . Screening for colon cancer 01/17/2014  . Other screening mammogram 01/17/2014  . Other and unspecified hyperlipidemia 01/17/2014  . Bilateral edema of lower extremity 01/17/2014  . Essential hypertension 01/17/2014    Screening Tests Health Maintenance  Topic Date Due  . FOOT EXAM  07/14/1958  . OPHTHALMOLOGY EXAM  07/14/1958  . TETANUS/TDAP  07/15/1967  . DEXA SCAN  07/14/2013  . INFLUENZA VACCINE  12/23/2014  . HEMOGLOBIN A1C  01/07/2015  . URINE MICROALBUMIN  04/12/2015  . MAMMOGRAM  04/29/2016  . COLONOSCOPY  04/30/2024  . ZOSTAVAX  Completed    Immunization History  Administered Date(s) Administered  . Influenza,inj,Quad PF,36+ Mos 02/18/2014  . Pneumococcal Polysaccharide-23 04/11/2014  . Zoster 04/11/2014    Preventative care: Last colonoscopy: *** Last mammogram: *** Last pap smear/pelvic exam: ***   DEXA:***  Prior vaccinations: TD or Tdap: ***  Influenza: ***  Pneumococcal: *** Prevnar13:  Shingles/Zostavax: ***    Medication List       This list is accurate as of: 07/15/14  2:37 PM.  Always use your most recent med list.               lisinopril-hydrochlorothiazide 20-12.5 MG per tablet  Commonly known as:  ZESTORETIC  Take 1 tablet by mouth daily.     naproxen 500 MG tablet  Commonly known as:  NAPROSYN  Take  1 tablet (500 mg total) by mouth 2 (two) times daily as needed.     oxyCODONE 5 MG immediate release tablet  Commonly known as:  Oxy IR/ROXICODONE  Take 1-2 tablets (5-10 mg total) by mouth every 6 (six) hours as needed for moderate pain, severe pain or breakthrough pain.     rosuvastatin 20 MG tablet  Commonly known as:  CRESTOR  Take 10 mg by mouth daily.     sitaGLIPtin-metformin 50-500 MG per tablet  Commonly known as:  JANUMET  Take 1 tablet by mouth 2 (two) times daily with a meal.     Vitamin D (Ergocalciferol) 50000 UNITS Caps capsule  Commonly known as:  DRISDOL  Take 50,000 Units by mouth every 7  (seven) days.        Past Surgical History  Procedure Laterality Date  . Mastectomy Left 05/1999  . Cholecystectomy    . Wisdom tooth extraction    . Rotator cuff repair      LEFT SHOULDER  . Cyst on wrist      LEFT WRIST-BENIGN  . Breast biopsy Right 04/29/2014  . Breast lumpectomy with radioactive seed localization Right 06/11/2014    Procedure: BREAST LUMPECTOMY WITH RADIOACTIVE SEED LOCALIZATION;  Surgeon: Jackolyn Confer, MD;  Location: [facility];  Service: General;  Laterality: Right;   No family history on file.  History reviewed: allergies, current medications, past family history, past medical history, past social history, past surgical history and problem list   Risk Factors: Osteoporosis/FallRisk: {osteoporosis causes:17871} In the past year have you fallen or had a near fall?:{yes/no (default no):140031::"No"} History of fracture in the past year: {YES NO:22349}  Tobacco History  Substance Use Topics  . Smoking status: Former Smoker    Quit date: 05/24/1993  . Smokeless tobacco: Never Used  . Alcohol Use: 0.0 oz/week    0 Standard drinks or equivalent per week     Comment: occasionlly   She {does/does not:19097} smoke.  Patient {ACTION; IS/IS TMA:26333545} a former smoker. Are there smokers in your home (other than you)?  {yes/no:20286}  Alcohol Current alcohol use: {history; alcohol:11675}  Caffeine Current caffeine use: {caffeine use:31917}  Exercise Current exercise: {exercise types:16438}  Nutrition/Diet Current diet: {diet habits:16563}  Cardiac risk factors: {risk factors:510}.  Depression Screen (Note: if answer to either of the following is "Yes", a more complete depression screening is indicated)   Q1: Over the past two weeks, have you felt down, depressed or hopeless? {Responses; yes/no (default no):140031::"No"}  Q2: Over the past two weeks, have you felt little interest or pleasure in doing things? {Responses; yes/no  (default no):140031::"No"}  Have you lost interest or pleasure in daily life? {Responses; yes/no (default no):140031::"No"}  Do you often feel hopeless? {Responses; yes/no (default no):140031::"No"}  Do you cry easily over simple problems? {Responses; yes/no (default no):140031::"No"}  Activities of Daily Living In your present state of health, do you have any difficulty performing the following activities?:  Driving? {Responses; yes/no (default no):140031::"No"} Managing money?  {Responses; yes/no (default no):140031::"No"} Feeding yourself? {Responses; yes/no (default no):140031::"No"} Getting from bed to chair? {Responses; yes/no (default no):140031::"No"} Climbing a flight of stairs? {Responses; yes/no (default no):140031::"No"} Preparing food and eating?: {yes/no (default no):140031::"No"} Bathing or showering? {Responses; yes/no (default no):140031::"No"} Getting dressed: {yes/no (default no):140031::"No"} Getting to the toilet? {Responses; yes/no (default no):140031::"No"} Using the toilet:{yes/no (default no):140031::"No"} Moving around from place to place: {yes/no (default no):140031::"No"} In the past year have you fallen or had a near fall?:{yes/no (default no):140031::"No"}  Are you sexually active?  {yes/no:20286}  Do you have more than one partner?  {Responses; yes/no (default no):140031::"No"}  Vision Difficulties: {yes/no (default no):140031::"No"}  Hearing Difficulties: {yes/no (default no):140031::"No"} Do you often ask people to speak up or repeat themselves? {Responses; yes/no (default no):140031::"No"} Do you experience ringing or noises in your ears? {Responses; yes/no (default no):140031::"No"} Do you have difficulty understanding soft or whispered voices? {Responses; yes/no (default no):140031::"No"}  Cognition  Do you feel that you have a problem with memory?{Responses; yes/no (default no):140031::"No"}  Do you often misplace items? {Responses; yes/no  (default no):140031::"No"}  Do you feel safe at home?  {yes/no:20286}  Advanced directives Does patient have a Health Care Power of Attorney? {yes/no:20286} Does patient have a Living Will? {yes/no:20286}   Objective:     Blood pressure 144/80, pulse 85, temperature 97.8 F (36.6 C), temperature source Oral, resp. rate 16, height 5' (1.524 m), weight 246 lb (111.585 kg). Body mass index is 48.04 kg/(m^2).  General appearance: alert, no distress, WD/WN, female Cognitive Testing  Alert? Yes  Normal Appearance?Yes  Oriented to person? Yes  Place? Yes   Time? Yes  Recall of three objects?  Yes  Can perform simple calculations? Yes  Displays appropriate judgment?Yes  Can read the correct time from a watch face?Yes  HEENT: normocephalic, sclerae anicteric, TMs pearly, nares patent, no discharge or erythema, pharynx normal Oral cavity: MMM, no lesions Neck: supple, no lymphadenopathy, no thyromegaly, no masses Heart: RRR, normal S1, S2, no murmurs Lungs: CTA bilaterally, no wheezes, rhonchi, or rales Abdomen: +bs, soft, non tender, non distended, no masses, no hepatomegaly, no splenomegaly Musculoskeletal: nontender, no swelling, no obvious deformity Extremities: no edema, no cyanosis, no clubbing Pulses: 2+ symmetric, upper and lower extremities, normal cap refill Neurological: alert, oriented x 3, CN2-12 intact, strength normal upper extremities and lower extremities, sensation normal throughout, DTRs 2+ throughout, no cerebellar signs, gait normal Psychiatric: normal affect, behavior normal, pleasant   Medicare Attestation I have personally reviewed: The patient's medical and social history Their use of alcohol, tobacco or illicit drugs Their current medications and supplements The patient's functional ability including ADLs,fall risks, home safety risks, cognitive, and hearing and visual impairment Diet and physical activities Evidence for depression or mood disorders  The  patient's weight, height, BMI, and visual acuity have been recorded in the chart.  I have made referrals, counseling, and provided education to the patient based on review of the above and I have provided the patient with a written personalized care plan for preventive services.     Aleasha Fregeau A., MD   07/15/2014

## 2014-07-16 ENCOUNTER — Other Ambulatory Visit: Payer: Self-pay

## 2014-07-16 DIAGNOSIS — E1165 Type 2 diabetes mellitus with hyperglycemia: Secondary | ICD-10-CM

## 2014-07-16 DIAGNOSIS — I10 Essential (primary) hypertension: Secondary | ICD-10-CM

## 2014-07-16 DIAGNOSIS — IMO0002 Reserved for concepts with insufficient information to code with codable children: Secondary | ICD-10-CM

## 2014-07-16 DIAGNOSIS — E0843 Diabetes mellitus due to underlying condition with diabetic autonomic (poly)neuropathy: Secondary | ICD-10-CM

## 2014-07-16 MED ORDER — NAPROXEN 500 MG PO TABS: 500.0000 mg | ORAL_TABLET | Freq: Two times a day (BID) | ORAL | Status: DC | PRN

## 2014-07-16 MED ORDER — SITAGLIPTIN PHOS-METFORMIN HCL 50-500 MG PO TABS: 1.0000 | ORAL_TABLET | Freq: Two times a day (BID) | ORAL | Status: DC

## 2014-07-16 MED ORDER — LISINOPRIL-HYDROCHLOROTHIAZIDE 20-12.5 MG PO TABS: 1.0000 | ORAL_TABLET | Freq: Every day | ORAL | Status: DC

## 2014-07-16 MED ORDER — GABAPENTIN 300 MG PO CAPS
ORAL_CAPSULE | ORAL | Status: DC
Start: 2014-07-16 — End: 2015-09-12

## 2014-07-16 MED ORDER — ROSUVASTATIN CALCIUM 20 MG PO TABS: 10.0000 mg | ORAL_TABLET | Freq: Every day | ORAL | Status: DC

## 2014-07-16 MED ORDER — VITAMIN D (ERGOCALCIFEROL) 1.25 MG (50000 UNIT) PO CAPS: 50000.0000 [IU] | ORAL_CAPSULE | ORAL | Status: DC

## 2014-07-16 NOTE — Telephone Encounter (Signed)
Patient requested all meds be refilled and sent into [facility] on [address]. This has been completed. Thanks!

## 2014-07-18 ENCOUNTER — Ambulatory Visit: Payer: Medicare Other | Admitting: *Deleted

## 2014-07-31 ENCOUNTER — Ambulatory Visit (INDEPENDENT_AMBULATORY_CARE_PROVIDER_SITE_OTHER): Payer: Medicare Other | Admitting: Podiatry

## 2014-07-31 ENCOUNTER — Encounter: Payer: Self-pay | Admitting: Podiatry

## 2014-07-31 VITALS — BP 148/72 | HR 72 | Resp 12

## 2014-07-31 DIAGNOSIS — E119 Type 2 diabetes mellitus without complications: Secondary | ICD-10-CM | POA: Diagnosis not present

## 2014-07-31 DIAGNOSIS — L608 Other nail disorders: Secondary | ICD-10-CM | POA: Diagnosis not present

## 2014-07-31 NOTE — Progress Notes (Signed)
   Subjective:    Patient ID: Erin House, female    DOB: [birth year], 64 y.o.   MRN: [id number]  HPI  N- diabetic foot exam L- 10 toes D- Long term O- Slowly C- No pain A- None T- trimmed own toes before  Patient denies any history of foot ulceration or claudication  Review of Systems  Cardiovascular: Positive for leg swelling.  Musculoskeletal: Positive for joint swelling.  All other systems reviewed and are negative.      Objective:   Physical Exam  Orientated 3  Vascular: DP pulses 2/4 bilaterally ET pulses 2/4 bilaterally Capillary reflex immediate bilaterally  Neurological: Sensation to 10 g monofilament wire intact 5/5 bilaterally Vibratory sensation intact bilaterally Ankle reflex equal and reactive bilaterally  Dermatological: Texture and turgor within normal limits bilaterally The toenail 6-10 are normal trophic with mild incurvation  Musculoskeletal: Pes planus bilaterally Mallet toe second bilaterally This is no restriction ankle, subtalar, midtarsal joints bilaterally       Assessment & Plan:   Assessment: Satisfactory neurovascular status Mildly incurvated toenails that are asymptomatic   Plan: Discuss results of diabetic foot examination with patient today I debrided the toenails 6-10 asa non-dystrophic I told patient that she could go to pedicurist if she wanted to with these exceptions: No soaking No manipulation of the cuticle Debrided toenails and smooth only  Reappoint yearly or at patient's request

## 2014-07-31 NOTE — Patient Instructions (Addendum)
Okay to go to the pedicurist with these restrictions No soaking in the whirlpool No manipulation of the cuticle Trim  ends of toenails only  Diabetes and Foot Care Diabetes may cause you to have problems because of poor blood supply (circulation) to your feet and legs. This may cause the skin on your feet to become thinner, break easier, and heal more slowly. Your skin may become dry, and the skin may peel and crack. You may also have nerve damage in your legs and feet causing decreased feeling in them. You may not notice minor injuries to your feet that could lead to infections or more serious problems. Taking care of your feet is one of the most important things you can do for yourself.  HOME CARE INSTRUCTIONS  Wear shoes at all times, even in the house. Do not go barefoot. Bare feet are easily injured.  Check your feet daily for blisters, cuts, and redness. If you cannot see the bottom of your feet, use a mirror or ask someone for help.  Wash your feet with warm water (do not use hot water) and mild soap. Then pat your feet and the areas between your toes until they are completely dry. Do not soak your feet as this can dry your skin.  Apply a moisturizing lotion or petroleum jelly (that does not contain alcohol and is unscented) to the skin on your feet and to dry, brittle toenails. Do not apply lotion between your toes.  Trim your toenails straight across. Do not dig under them or around the cuticle. File the edges of your nails with an emery board or nail file.  Do not cut corns or calluses or try to remove them with medicine.  Wear clean socks or stockings every day. Make sure they are not too tight. Do not wear knee-high stockings since they may decrease blood flow to your legs.  Wear shoes that fit properly and have enough cushioning. To break in new shoes, wear them for just a few hours a day. This prevents you from injuring your feet. Always look in your shoes before you put them on  to be sure there are no objects inside.  Do not cross your legs. This may decrease the blood flow to your feet.  If you find a minor scrape, cut, or break in the skin on your feet, keep it and the skin around it clean and dry. These areas may be cleansed with mild soap and water. Do not cleanse the area with peroxide, alcohol, or iodine.  When you remove an adhesive bandage, be sure not to damage the skin around it.  If you have a wound, look at it several times a day to make sure it is healing.  Do not use heating pads or hot water bottles. They may burn your skin. If you have lost feeling in your feet or legs, you may not know it is happening until it is too late.  Make sure your health care provider performs a complete foot exam at least annually or more often if you have foot problems. Report any cuts, sores, or bruises to your health care provider immediately. SEEK MEDICAL CARE IF:   You have an injury that is not healing.  You have cuts or breaks in the skin.  You have an ingrown nail.  You notice redness on your legs or feet.  You feel burning or tingling in your legs or feet.  You have pain or cramps in your legs  and feet.  Your legs or feet are numb.  Your feet always feel cold. SEEK IMMEDIATE MEDICAL CARE IF:   There is increasing redness, swelling, or pain in or around a wound.  There is a red line that goes up your leg.  Pus is coming from a wound.  You develop a fever or as directed by your health care provider.  You notice a bad smell coming from an ulcer or wound. Document Released: 05/07/2000 Document Revised: 01/10/2013 Document Reviewed: 10/17/2012 [facility] Patient Information 2015 [place], [place]. This information is not intended to replace advice given to you by your health care provider. Make sure you discuss any questions you have with your health care provider.

## 2014-08-09 ENCOUNTER — Encounter: Payer: Medicare Other | Attending: Internal Medicine | Admitting: Skilled Nursing Facility1

## 2014-08-09 ENCOUNTER — Encounter: Payer: Self-pay | Admitting: Skilled Nursing Facility1

## 2014-08-09 VITALS — Ht 60.0 in | Wt 247.0 lb

## 2014-08-09 DIAGNOSIS — E1165 Type 2 diabetes mellitus with hyperglycemia: Secondary | ICD-10-CM | POA: Insufficient documentation

## 2014-08-09 DIAGNOSIS — IMO0002 Reserved for concepts with insufficient information to code with codable children: Secondary | ICD-10-CM

## 2014-08-09 DIAGNOSIS — Z713 Dietary counseling and surveillance: Secondary | ICD-10-CM | POA: Insufficient documentation

## 2014-08-09 NOTE — Progress Notes (Signed)
Diabetes Self-Management Education  Visit Type:    Appt. Start Time: 10:00 Appt. End Time: 11:30  08/09/2014  [name], identified by name and date of birth, is a 64 y.o. female with a diagnosis of Diabetes: Type 2.  Other people present during visit:      ASSESSMENT  Height 5' (1.524 m), weight 247 lb (112.038 kg). Body mass index is 48.24 kg/(m^2).   Pt states she is not taking gabapentin like it is prescribed because she is afraid of the side affects. Pt states she may have stopped going to the gym because she is upset her very good friend died but she is going to get back in the gym very soon.  Initial Visit Information:  Are you currently following a meal plan?: No   Are you taking your medications as prescribed?: Yes Are you checking your feet?: No   How often do you need to have someone help you when you read instructions, pamphlets, or other written materials from your doctor or pharmacy?: 1 - Never    Psychosocial:     Patient Belief/Attitude about Diabetes: Other (comment) (I hate diabetes, I am angry) Self-care barriers: None Self-management support: Friends Patient Concerns: Nutrition/Meal planning, Healthy Lifestyle, Weight Control Special Needs: None Preferred Learning Style: Hands on Learning Readiness: Ready  Complications:   Last HgB A1C per patient/outside source: 7.3 mg/dL How often do you check your blood sugar?:  (randomly ) Fasting Blood glucose range (mg/dL): 130-179 Number of hypoglycemic episodes per month: 0 Number of hyperglycemic episodes per week: 0 Have you had a dilated eye exam in the past 12 months?: Yes Have you had a dental exam in the past 12 months?: Yes  Diet Intake:  Breakfast: toast and coffee----2 eggs, bacon, toast Snack (morning): chips----fruit-----chicken Lunch: none Snack (afternoon): sherbet----nuts Dinner: fried fish, french fries, greens Snack (evening): ice cream----oreo thins Beverage(s): acai  flavored water, ICE, coffee, soda-fanta and sprite  Exercise:  Exercise: ADL's  Individualized Plan for Diabetes Self-Management Training:   Learning Objective:  Patient will have a greater understanding of diabetes self-management.  Patient education plan per assessed needs and concerns is to attend individual sessions for     Education Topics Reviewed with Patient Today:  Definition of diabetes, type 1 and 2, and the diagnosis of diabetes, Factors that contribute to the development of diabetes Role of diet in the treatment of diabetes and the relationship between the three main macronutrients and blood glucose level, Food label reading, portion sizes and measuring food., Carbohydrate counting Role of exercise on diabetes management, blood pressure control and cardiac health.     Discussed and identified patients' treatment of hyperglycemia. Relationship between chronic complications and blood glucose control, Dental care Worked with patient to identify barriers to care and solutions      PATIENTS GOALS/Plan (Developed by the patient):  Nutrition: Follow meal plan discussed, General guidelines for healthy choices and portions discussed, Adjust meds/carbs with exercise as discussed Physical Activity: Exercise 5-7 days per week but do not go as you did a few months ago, take it easy and increase slowly Monitoring : test blood glucose pre and post meals as discussed; throughout the week sometimes before a meal and sometimes after  Plan:   There are no Patient Instructions on file for this visit.  Expected Outcomes:  Demonstrated interest in learning. Expect positive outcomes  Education material provided: Living Well with Diabetes, Meal plan card, Snack sheet and No sodium seasonings  If problems or  questions, patient to contact team via:  Phone  Future DSME appointment: PRN

## 2014-08-15 ENCOUNTER — Ambulatory Visit: Payer: Medicare Other | Admitting: *Deleted

## 2014-08-24 ENCOUNTER — Encounter (HOSPITAL_COMMUNITY): Payer: Self-pay | Admitting: Emergency Medicine

## 2014-08-24 ENCOUNTER — Emergency Department (HOSPITAL_COMMUNITY)
Admission: EM | Admit: 2014-08-24 | Discharge: 2014-08-24 | Disposition: A | Discharge status: Home / Self Care | Payer: Medicare Other | Source: Home / Self Care | Attending: Family Medicine | Admitting: Family Medicine

## 2014-08-24 DIAGNOSIS — T22292A Burn of second degree of multiple sites of left shoulder and upper limb, except wrist and hand, initial encounter: Secondary | ICD-10-CM | POA: Diagnosis not present

## 2014-08-24 MED ORDER — SILVER SULFADIAZINE 1 % EX CREA
TOPICAL_CREAM | Freq: Once | CUTANEOUS | Status: AC
  Administered 2014-08-24: 17:00:00 via TOPICAL

## 2014-08-24 MED ORDER — SILVER SULFADIAZINE 1 % EX CREA
TOPICAL_CREAM | CUTANEOUS | Status: AC
  Filled 2014-08-24: qty 85

## 2014-08-24 MED ORDER — SILVER SULFADIAZINE 1 % EX CREA: TOPICAL_CREAM | Freq: Every day | CUTANEOUS | Status: DC
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
## 2014-08-24 NOTE — Discharge Instructions (Signed)
Thank you for coming in today. Follow-up with your primary care provider   Burn Care Your skin is a natural barrier to infection. It is the largest organ of your body. Burns damage this natural protection. To help prevent infection, it is very important to follow your caregiver's instructions in the care of your burn. Burns are classified as:  First degree. There is only redness of the skin (erythema). No scarring is expected.  Second degree. There is blistering of the skin. Scarring may occur with deeper burns.  Third degree. All layers of the skin are injured, and scarring is expected. HOME CARE INSTRUCTIONS   Wash your hands well before changing your bandage.  Change your bandage as often as directed by your caregiver.  Remove the old bandage. If the bandage sticks, you may soak it off with cool, clean water.  Cleanse the burn thoroughly but gently with mild soap and water.  Pat the area dry with a clean, dry cloth.  Apply a thin layer of antibacterial cream to the burn.  Apply a clean bandage as instructed by your caregiver.  Keep the bandage as clean and dry as possible.  Elevate the affected area for the first 24 hours, then as instructed by your caregiver.  Only take over-the-counter or prescription medicines for pain, discomfort, or fever as directed by your caregiver. SEEK IMMEDIATE MEDICAL CARE IF:   You develop excessive pain.  You develop redness, tenderness, swelling, or red streaks near the burn.  The burned area develops yellowish-white fluid (pus) or a bad smell.  You have a fever. MAKE SURE YOU:   Understand these instructions.  Will watch your condition.  Will get help right away if you are not doing well or get worse. Document Released: 05/10/2005 Document Revised: 08/02/2011 Document Reviewed: 09/30/2010 [facility] Patient Information 2015 [place], [place]. This information is not intended to replace advice given to you by your health care provider.  Make sure you discuss any questions you have with your health care provider.
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
## 2014-10-22 ENCOUNTER — Ambulatory Visit (INDEPENDENT_AMBULATORY_CARE_PROVIDER_SITE_OTHER): Payer: Medicare Other | Admitting: Family Medicine

## 2014-10-22 VITALS — BP 137/68 | HR 80 | Temp 98.7°F | Resp 16 | Ht 60.0 in | Wt 248.0 lb

## 2014-10-22 DIAGNOSIS — E1165 Type 2 diabetes mellitus with hyperglycemia: Secondary | ICD-10-CM | POA: Diagnosis not present

## 2014-10-22 DIAGNOSIS — R319 Hematuria, unspecified: Secondary | ICD-10-CM

## 2014-10-22 DIAGNOSIS — I10 Essential (primary) hypertension: Secondary | ICD-10-CM

## 2014-10-22 DIAGNOSIS — E785 Hyperlipidemia, unspecified: Secondary | ICD-10-CM

## 2014-10-22 DIAGNOSIS — G4733 Obstructive sleep apnea (adult) (pediatric): Secondary | ICD-10-CM

## 2014-10-22 DIAGNOSIS — IMO0002 Reserved for concepts with insufficient information to code with codable children: Secondary | ICD-10-CM

## 2014-10-22 DIAGNOSIS — E559 Vitamin D deficiency, unspecified: Secondary | ICD-10-CM

## 2014-10-22 LAB — COMPLETE METABOLIC PANEL WITH GFR
ALT: 28 U/L (ref 0–35)
AST: 20 U/L (ref 0–37)
Albumin: 4.1 g/dL (ref 3.5–5.2)
Alkaline Phosphatase: 59 U/L (ref 39–117)
BUN: 16 mg/dL (ref 6–23)
CALCIUM: 9.1 mg/dL (ref 8.4–10.5)
CHLORIDE: 104 meq/L (ref 96–112)
CO2: 27 mEq/L (ref 19–32)
CREATININE: 0.73 mg/dL (ref 0.50–1.10)
GFR, EST NON AFRICAN AMERICAN: 86 mL/min
GFR, Est African American: >89 mL/min
Glucose, Bld: 138 mg/dL — ABNORMAL HIGH (ref 70–99)
Potassium: 3.9 mEq/L (ref 3.5–5.3)
Sodium: 142 mEq/L (ref 135–145)
Total Bilirubin: 0.5 mg/dL (ref 0.2–1.2)
Total Protein: 6.6 g/dL (ref 6.0–8.3)

## 2014-10-22 LAB — POCT URINALYSIS DIP (DEVICE)
BILIRUBIN URINE: NEGATIVE
Glucose, UA: NEGATIVE mg/dL
LEUKOCYTES UA: NEGATIVE
Nitrite: NEGATIVE
PROTEIN: NEGATIVE mg/dL
Specific Gravity, Urine: >=1.03 (ref 1.005–1.030)
UROBILINOGEN UA: 0.2 mg/dL (ref 0.0–1.0)
pH: 5 (ref 5.0–8.0)

## 2014-10-22 LAB — HEMOGLOBIN A1C
Hgb A1c MFr Bld: 7.7 % — ABNORMAL HIGH (ref <5.7)
Mean Plasma Glucose: 174 mg/dL — ABNORMAL HIGH (ref <117)

## 2014-10-22 MED ORDER — VITAMIN D (ERGOCALCIFEROL) 1.25 MG (50000 UNIT) PO CAPS: 50000.0000 [IU] | ORAL_CAPSULE | ORAL | Status: DC

## 2014-10-22 MED ORDER — ROSUVASTATIN CALCIUM 10 MG PO TABS: 10.0000 mg | ORAL_TABLET | Freq: Every day | ORAL | Status: DC

## 2014-10-22 NOTE — Progress Notes (Signed)
Subjective:    Patient ID: Erin House, female    DOB: Feb 03, 1951, 64 y.o.   MRN: [id number]  Hypertension  Diabetes Pertinent negatives for diabetes include no fatigue, no polydipsia, no polyphagia and no polyuria.   Erin House, a 63 year old,  presents for a 3 month follow up of type II diabetes mellitus and hypertension. She states that she has been checking her blood sugars daily and average daily glucose has ranged between 125-140 since last visit. She states that she is continuing to exercise at the local [employer] and follow a balanced diet. Erin House denies headache, weakness, fatigue, polydipsia, polyuria, polyphagia, nausea, vomiting, or diarrhea.   Erin House is also follow up for hypertension. Her blood pressure has improved since adding medications and modifying diet.  She is exercising and is adherent to low salt diet.  Blood pressure is well controlled at home. Patient denies chest pain, dyspnea, fatigue, orthopnea, palpitations, syncope and tachypnea.  Cardiovascular risk factors: diabetes mellitus, dyslipidemia and obesity (BMI >= 30 kg/m2).   Past Medical History  Diagnosis Date  . Diabetes mellitus without complication   . Hyperlipemia   . Hypertension   . Arthritis     knees  . Sleep apnea     has CPAP but does not wear every night  . 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 Marital Status: Married    Spouse Name: N/A  . Number of Children: N/A  . Years of Education: N/A   Occupational History  . Not on file.   Social History Main Topics  . Smoking status: Former Smoker    Quit date: 05/24/1993  . Smokeless tobacco: Never Used  . Alcohol Use: 0.0 oz/week    0 Standard drinks or equivalent per week     Comment: occasionlly  . Drug Use: No  . Sexual Activity: Not on file   Other Topics Concern  . Not on file   Social History Narrative     Review of Systems  Constitutional: Negative.  Negative for fatigue.  HENT:  Negative.   Eyes: Negative.   Respiratory: Negative.   Cardiovascular: Negative.   Gastrointestinal: Negative.   Endocrine: Negative for polydipsia, polyphagia and polyuria.  Genitourinary: Negative.   Musculoskeletal: Negative.   Skin: Negative.   Allergic/Immunologic: Negative.   Neurological: Negative.   Hematological: Negative.   Psychiatric/Behavioral: Negative.        Objective:   Physical Exam  Constitutional: She is oriented to person, place, and time. Vital signs are normal. She appears well-developed and well-nourished.  HENT:  Head: Normocephalic and atraumatic.  Right Ear: External ear normal.  Left Ear: External ear normal.  Mouth/Throat: Oropharynx is clear and moist.  Eyes: Conjunctivae are normal. Pupils are equal, round, and reactive to light.  Neck: Normal range of motion. Neck supple.  Cardiovascular: Normal rate, normal heart sounds and intact distal pulses.   Pulmonary/Chest: Effort normal and breath sounds normal.  Abdominal: Soft. Bowel sounds are normal.  Musculoskeletal: Normal range of motion.  Lymphadenopathy:       Head (right side): No submental and no submandibular adenopathy present.       Head (left side): No submental and no submandibular adenopathy present.  Neurological: She is alert and oriented to person, place, and time. She has normal strength and normal reflexes. She displays a negative Romberg sign.  Monofilament exam negative  Skin: Skin is warm and dry.  Psychiatric:  She has a normal mood and affect. Her behavior is normal. Judgment and thought content normal.      BP 137/68 mmHg  Pulse 80  Temp(Src) 98.7 F (37.1 C) (Oral)  Resp 16  Ht 5' (1.524 m)  Wt 248 lb (112.492 kg)  BMI 48.43 kg/m2  Assessment & Plan:  1. Essential hypertension Blood pressure is currently at goal on current medication regimen.  - COMPLETE METABOLIC PANEL WITH GFR - Basic Metabolic Panel; Future  2. Type II diabetes mellitus,  uncontrolled Reviewed previous hemoglobin A1C, 7.3. Erin House states that she has been taking medications consistently. She reports that she has not been following a low carbohydrate diet due to increase travel and not been cooking meals.  - Hemoglobin A1c - Hemoglobin A1c; Future  3. Obesity, morbid Erin House continues to exercise at the [facility]. She has not been following a low carbohydrate diet. Patient given written information on lowfat, low carbohydrate diet.  4. Hyperlipidemia - rosuvastatin (CRESTOR) 10 MG tablet; Take 1 tablet (10 mg total) by mouth daily.  Dispense: 90 tablet; Refill: 1 - Lipid Panel; Future  5. Vitamin D deficiency - Vitamin D, Ergocalciferol, (DRISDOL) 50000 UNITS CAPS capsule; Take 1 capsule (50,000 Units total) by mouth every 7 (seven) days.  Dispense: 4 capsule; Refill: 3 - Vitamin D, 25-hydroxy  6. Obstructive sleep apnea She states that she was previously diagnosed with sleep apnea and was issued a CPAP years ago. She states that the mask was ill-fitting and she discontinued use. She reports increased daytime sleepiness and she is not getting a full nights rest. Recommend that patient have a split night sleep study. Will follow up on previous referral for sleep study.   Dorena Dew, FNP

## 2014-10-22 NOTE — Patient Instructions (Signed)
Diabetes and Exercise Exercising regularly is important. It is not just about losing weight. It has many health benefits, such as:  Improving your overall fitness, flexibility, and endurance.  Increasing your bone density.  Helping with weight control.  Decreasing your body fat.  Increasing your muscle strength.  Reducing stress and tension.  Improving your overall health. People with diabetes who exercise gain additional benefits because exercise:  Reduces appetite.  Improves the body's use of blood sugar (glucose).  Helps lower or control blood glucose.  Decreases blood pressure.  Helps control blood lipids (such as cholesterol and triglycerides).  Improves the body's use of the hormone insulin by:  Increasing the body's insulin sensitivity.  Reducing the body's insulin needs.  Decreases the risk for heart disease because exercising:  Lowers cholesterol and triglycerides levels.  Increases the levels of good cholesterol (such as high-density lipoproteins [HDL]) in the body.  Lowers blood glucose levels. YOUR ACTIVITY PLAN  Choose an activity that you enjoy and set realistic goals. Your health care provider or diabetes educator can help you make an activity plan that works for you. Exercise regularly as directed by your health care provider. This includes:  Performing resistance training twice a week such as push-ups, sit-ups, lifting weights, or using resistance bands.  Performing 150 minutes of cardio exercises each week such as walking, running, or playing sports.  Staying active and spending no more than 90 minutes at one time being inactive. Even short bursts of exercise are good for you. Three 10-minute sessions spread throughout the day are just as beneficial as a single 30-minute session. Some exercise ideas include:  Taking the dog for a walk.  Taking the stairs instead of the elevator.  Dancing to your favorite song.  Doing an exercise  video.  Doing your favorite exercise with a friend. RECOMMENDATIONS FOR EXERCISING WITH TYPE 1 OR TYPE 2 DIABETES   Check your blood glucose before exercising. If blood glucose levels are greater than 240 mg/dL, check for urine ketones. Do not exercise if ketones are present.  Avoid injecting insulin into areas of the body that are going to be exercised. For example, avoid injecting insulin into:  The arms when playing tennis.  The legs when jogging.  Keep a record of:  Food intake before and after you exercise.  Expected peak times of insulin action.  Blood glucose levels before and after you exercise.  The type and amount of exercise you have done.  Review your records with your health care provider. Your health care provider will help you to develop guidelines for adjusting food intake and insulin amounts before and after exercising.  If you take insulin or oral hypoglycemic agents, watch for signs and symptoms of hypoglycemia. They include:  Dizziness.  Shaking.  Sweating.  Chills.  Confusion.  Drink plenty of water while you exercise to prevent dehydration or heat stroke. Body water is lost during exercise and must be replaced.  Talk to your health care provider before starting an exercise program to make sure it is safe for you. Remember, almost any type of activity is better than none. Document Released: 07/31/2003 Document Revised: 09/24/2013 Document Reviewed: 10/17/2012 [facility] Patient Information 2015 [place], [place]. This information is not intended to replace advice given to you by your health care provider. Make sure you discuss any questions you have with your health care provider. How to Avoid Diabetes Problems You can do a lot to prevent or slow down diabetes problems. Following your diabetes plan  and taking care of yourself can reduce your risk of serious or life-threatening complications. Below, you will find certain things you can do to prevent diabetes  problems. MANAGE YOUR DIABETES Follow your health care provider's, nurse educator's, and dietitian's instructions for managing your diabetes. They will teach you the basics of diabetes care. They can help answer questions you may have. Learn about diabetes and make healthy choices regarding eating and physical activity. Monitor your blood glucose level regularly. Your health care provider will help you decide how often to check your blood glucose level depending on your treatment goals and how well you are meeting them.  DO NOT USE NICOTINE Nicotine and diabetes are a dangerous combination. Nicotine raises your risk for diabetes problems. If you quit using nicotine, you will lower your risk for heart attack, stroke, nerve disease, and kidney disease. Your cholesterol and your blood pressure levels may improve. Your blood circulation will also improve. Do not use any tobacco products, including cigarettes, chewing tobacco, or electronic cigarettes. If you need help quitting, ask your health care provider. KEEP YOUR BLOOD PRESSURE UNDER CONTROL Keeping your blood pressure under control will help prevent damage to your eyes, kidneys, heart, and blood vessels. Blood pressure consists of two numbers. The top number should be below 120, and the bottom number should be below 80 (120/80). Keep your blood pressure as close to these numbers as you can. If you already have kidney disease, you may want even lower blood pressure to protect your kidneys. Talk to your health care provider to make sure that your blood pressure goal is right for your needs. Meal planning, medicines, and exercise can help you reach your blood pressure target. Have your blood pressure checked at every visit with your health care provider. KEEP YOUR CHOLESTEROL UNDER CONTROL Normal cholesterol levels will help prevent heart disease and stroke. These are the biggest health problems for people with diabetes. Keeping cholesterol levels under  control can also help with blood flow. Have your cholesterol level checked at least once a year. Your health care provider may prescribe a medicine known as a statin. Statins lower your cholesterol. If you are not taking a statin, ask your health care provider if you should be. Meal planning, exercise, and medicines can help you reach your cholesterol targets.  SCHEDULE AND KEEP YOUR ANNUAL PHYSICAL EXAMS AND EYE EXAMS Your health care provider will tell you how often he or she wants to see you depending on your plan of treatment. It is important that you keep these appointments so that possible problems can be identified early and complications can be avoided or treated.  Every visit with your health care provider should include your weight, blood pressure, and an evaluation of your blood glucose control.  Your hemoglobin A1c should be checked:  At least twice a year if you are at your goal.  Every 3 months if there are changes in treatment.  If you are not meeting your goals.  Your blood lipids should be checked yearly. You should also be checked yearly to see if you have protein in your urine (microalbumin).  Schedule a dilated eye exam within 5 years of your diagnosis if you have type 1 diabetes, and then yearly. Schedule a dilated eye exam at diagnosis if you have type 2 diabetes, and then yearly. All exams thereafter can be extended to every 2 to 3 years if one or more exams have been normal. KEEP YOUR VACCINES CURRENT The flu vaccine is recommended  yearly. The formula for the vaccine changes every year and needs to be updated for the best protection against current viruses. It is recommended that people with diabetes who are over 32 years old get the pneumonia vaccine. In some cases, two separate shots may be given. Ask your health care provider if your pneumonia vaccination is up-to-date. However, there are some instances where another vaccine is recommended. Check with your health care  provider. TAKE CARE OF YOUR FEET  Diabetes may cause you to have a poor blood supply (circulation) to your legs and feet. Because of this, the skin may be thinner, break easier, and heal more slowly. You also may have nerve damage in your legs and feet, causing decreased feeling. You may not notice minor injuries to your feet that could lead to serious problems or infections. Taking care of your feet is very important. Visual foot exams are performed at every routine medical visit. The exams check for cuts, injuries, or other problems with the feet. A comprehensive foot exam should be done yearly. This includes visual inspection as well as assessing foot pulses and testing for loss of sensation. You should also do the following:  Inspect your feet daily for cuts, calluses, blisters, ingrown toenails, and signs of infection, such as redness, swelling, or pus.  Wash and dry your feet thoroughly, especially between the toes.  Avoid soaking your feet regularly in hot water baths.  Moisturize dry skin with lotion, avoiding areas between your toes.  Cut toenails straight across and file the edges.  Avoid shoes that do not fit well or have areas that irritate your skin.  Avoid going barefooted or wearing only socks. Your feet need protection. TAKE CARE OF YOUR TEETH People with poorly controlled diabetes are more likely to have gum (periodontal) disease. These infections make diabetes harder to control. Periodontal diseases, if left untreated, can lead to tooth loss. Brush your teeth twice a day, floss, and see your dentist for checkups and cleaning every 6 months, or 2 times a year. ASK YOUR HEALTH CARE PROVIDER ABOUT TAKING ASPIRIN Taking aspirin daily is recommended to help prevent cardiovascular disease in people with and without diabetes. Ask your health care provider if this would benefit you and what dose he or she would recommend. DRINK RESPONSIBLY Moderate amounts of alcohol (less than 1  drink per day for adult women and less than 2 drinks per day for adult men) have a minimal effect on blood glucose if ingested with food. It is important to eat food with alcohol to avoid hypoglycemia. People should avoid alcohol if they have a history of alcohol abuse or dependence, if they are pregnant, and if they have liver disease, pancreatitis, advanced neuropathy, or severe hypertriglyceridemia. LESSEN STRESS Living with diabetes can be stressful. When you are under stress, your blood glucose may be affected in two ways:  Stress hormones may cause your blood glucose to rise.  You may be distracted from taking good care of yourself. It is a good idea to be aware of your stress level and make changes that are necessary to help you better manage challenging situations. Support groups, planned relaxation, a hobby you enjoy, meditation, healthy relationships, and exercise all work to lower your stress level. If your efforts do not seem to be helping, get help from your health care provider or a trained mental health professional. Document Released: 01/26/2011 Document Revised: 09/24/2013 Document Reviewed: 07/04/2013 [facility] Patient Information 2015 [place], [place]. This information is not intended to  replace advice given to you by your health care provider. Make sure you discuss any questions you have with your health care provider. Diabetes, Eating Away From Home Sometimes, you might eat in a restaurant or have meals that are prepared by someone else. You can enjoy eating out. However, the portions in restaurants may be much larger than needed. Listed below are some ideas to help you choose foods that will keep your blood glucose (sugar) in better control.  TIPS FOR EATING OUT  Know your meal plan and how many carbohydrate servings you should have at each meal. You may wish to carry a copy of your meal plan in your purse or wallet. Learn the foods included in each food group.  Make a list of  restaurants near you that offer healthy choices. Take a copy of the carry-out menus to see what they offer. Then, you can plan what you will order ahead of time.  Become familiar with serving sizes by practicing them at home using measuring cups and spoons. Once you learn to recognize portion sizes, you will be able to correctly estimate the amount of total carbohydrate you are allowed to eat at the restaurant. Ask for a takeout box if the portion is more than you should have. When your food comes, leave the amount you should have on the plate, and put the rest in the takeout box before you start eating.  Plan ahead if your mealtime will be different from usual. Check with your caregiver to find out how to time meals and medicine if you are taking insulin.  Avoid high-fat foods, such as fried foods, cream sauces, high-fat salad dressings, or any added butter or margarine.  Do not be afraid to ask questions. Ask your server about the portion size, cooking methods, ingredients and if items can be substituted. Restaurants do not list all available items on the menu. You can ask for your main entree to be prepared using skim milk, oil instead of butter or margarine, and without gravy or sauces. Ask your waiter or waitress to serve salad dressings, gravy, sauces, margarine, and sour cream on the side. You can then add the amount your meal plan suggests.  Add more vegetables whenever possible.  Avoid items that are labeled "jumbo," "giant," "deluxe," or "supersized."  You may want to split an entre with someone and order an extra side salad.  Watch for hidden calories in foods like croutons, bacon, or cheese.  Ask your server to take away the bread basket or chips from your table.  Order a dinner salad as an appetizer. You can eat most foods served in a restaurant. Some foods are better choices than others. Breads and Starches  Recommended: All kinds of bread (wheat, rye, white, oatmeal, [place],  [place], raisin), hard or soft dinner rolls, frankfurter or hamburger buns, small bagels, small corn or whole-wheat flour tortillas.  Avoid: Frosted or glazed breads, butter rolls, egg or cheese breads, croissants, sweet rolls, pastries, coffee cake, glazed or frosted doughnuts, muffins. Crackers  Recommended: Animal crackers, graham, rye, saltine, oyster, and matzoth crackers. Bread sticks, melba toast, rusks, pretzels, popcorn (without fat), zwieback toast.  Avoid: High-fat snack crackers or chips. Buttered popcorn. Cereals  Recommended: Hot and cold cereals. Whole grains such as oatmeal or shredded wheat are good choices.  Avoid: Sugar-coated or granola type cereals. Potatoes/Pasta/Rice/Beans  Recommended: Order baked, boiled, or mashed potatoes, rice or noodles without added fat, whole beans. Order gravies, butter, margarine, or sauces on the  side so you can control the amount you add.  Avoid: Hash browns or fried potatoes. Potatoes, pasta, or rice prepared with cream or cheese sauce. Potato or pasta salads prepared with large amounts of dressing. Fried beans or fried rice. Vegetables  Recommended: Order steamed, baked, boiled, or stewed vegetables without sauces or extra fat. Ask that sauce be served on the side. If vegetables are not listed on the menu, ask what is available.  Avoid: Vegetables prepared with cream, butter, or cheese sauce. Fried vegetables. Salad Bars  Recommended: Many of the vegetables at a salad bar are considered "free." Use lemon juice, vinegar, or low-calorie salad dressing (fewer than 20 calories per serving) as "free" dressings for your salad. Look for salad bar ingredients that have no added fat or sugar such as tomatoes, lettuce, cucumbers, broccoli, carrots, onions, and mushrooms.  Avoid: Prepared salads with large amounts of dressing, such as coleslaw, caesar salad, macaroni salad, bean salad, or carrot salad. Fruit  Recommended: Eat fresh fruit or  fresh fruit salad without added dressing. A salad bar often offers fresh fruit choices, but canned fruit at a restaurant is usually packed in sugar or syrup.  Avoid: Sweetened canned or frozen fruits, plain or sweetened fruit juice. Fruit salads with dressing, sour cream, or sugar added to them. Meat and Meat Substitutes  Recommended: Order broiled, baked, roasted, or grilled meat, poultry, or fish. Trim off all visible fat. Do not eat the skin of poultry. The size stated on the menu is the raw weight. Meat shrinks by  in cooking (for example, 4 oz raw equals 3 oz cooked meat).  Avoid: Deep-fat fried meat, poultry, or fish. Breaded meats. Eggs  Recommended: Order soft, hard-cooked, poached, or scrambled eggs. Omelets may be okay, depending on what ingredients are added. Egg substitutes are also a good choice.  Avoid: Fried eggs, eggs prepared with cream or cheese sauce. Milk  Recommended: Order low-fat or fat-free milk according to your meal plan. Plain, nonfat yogurt or flavored yogurt with no sugar added may be used as a substitute for milk. Soy milk may also be used.  Avoid: Milk shakes or sweetened milk beverages. Soups and Combination Foods  Recommended: Clear broth or consomm are "free" foods and may be used as an appetizer. Broth-based soups with fat removed count as a starch serving and are preferred over cream soups. Soups made with beans or split peas may be eaten but count as a starch.  Avoid: Fatty soups, soup made with cream, cheese soup. Combination foods prepared with excessive amounts of fat or with cream or cheese sauces. Desserts and Sweets  Recommended: Ask for fresh fruit. Sponge or angel food cake without icing, ice milk, no sugar added ice cream, sherbet, or frozen yogurt may fit into your meal plan occasionally.  Avoid: Pastries, puddings, pies, cakes with icing, custard, gelatin desserts. Fats and Oils  Recommended: Choose healthy fats such as olive oil, canola  oil, or tub margarine, reduced fat or fat-free sour cream, cream cheese, avocado, or nuts.  Avoid: Any fats in excess of your allowed portion. Deep-fried foods or any food with a large amount of fat. Note: Ask for all fats to be served on the side, and limit your portion sizes according to your meal plan. Document Released: 05/10/2005 Document Revised: 08/02/2011 Document Reviewed: 08/07/2013 [facility] Patient Information 2015 [place], [place]. This information is not intended to replace advice given to you by your health care provider. Make sure you discuss any questions you  have with your health care provider.  

## 2014-10-23 ENCOUNTER — Encounter: Payer: Self-pay | Admitting: Family Medicine

## 2014-10-23 ENCOUNTER — Other Ambulatory Visit: Payer: Self-pay | Admitting: Family Medicine

## 2014-10-23 DIAGNOSIS — E1165 Type 2 diabetes mellitus with hyperglycemia: Secondary | ICD-10-CM

## 2014-10-23 DIAGNOSIS — IMO0002 Reserved for concepts with insufficient information to code with codable children: Secondary | ICD-10-CM

## 2014-10-23 MED ORDER — SITAGLIPTIN PHOS-METFORMIN HCL 50-1000 MG PO TABS: 1.0000 | ORAL_TABLET | Freq: Two times a day (BID) | ORAL | Status: DC

## 2014-10-23 NOTE — Progress Notes (Signed)
Reviewed labs, hemoglobin A1C increased from 7.3 to 7.7 over the past 3 months. Will increase Janumet to 50-1000 BID along with a carbohydrate modified diet regimen. Patient has been to diabetes and nutrition and expresses understanding of carbohydrate counting. Will follow up with Ms. Steelman and previously scheduled.   Meds ordered this encounter  Medications  . sitaGLIPtin-metformin (JANUMET) 50-1000 MG per tablet    Sig: Take 1 tablet by mouth 2 (two) times daily with a meal.    Dispense:  180 tablet    Refill:  1    Order Specific Question:  Supervising Provider    Answer:  Liston Alba A [[id number]]    Dorena Dew, FNP

## 2014-10-28 ENCOUNTER — Encounter: Payer: Self-pay | Admitting: Family Medicine

## 2014-10-28 ENCOUNTER — Ambulatory Visit (INDEPENDENT_AMBULATORY_CARE_PROVIDER_SITE_OTHER): Payer: Medicare Other | Admitting: Family Medicine

## 2014-10-28 VITALS — BP 164/62 | HR 75 | Temp 98.9°F | Resp 16 | Ht 60.0 in | Wt 245.0 lb

## 2014-10-28 DIAGNOSIS — R05 Cough: Secondary | ICD-10-CM | POA: Diagnosis not present

## 2014-10-28 DIAGNOSIS — J3489 Other specified disorders of nose and nasal sinuses: Secondary | ICD-10-CM

## 2014-10-28 DIAGNOSIS — I10 Essential (primary) hypertension: Secondary | ICD-10-CM

## 2014-10-28 DIAGNOSIS — Z87898 Personal history of other specified conditions: Secondary | ICD-10-CM

## 2014-10-28 DIAGNOSIS — J0100 Acute maxillary sinusitis, unspecified: Secondary | ICD-10-CM

## 2014-10-28 DIAGNOSIS — R059 Cough, unspecified: Secondary | ICD-10-CM

## 2014-10-28 LAB — POCT INFLUENZA A/B
INFLUENZA B, POC: NEGATIVE
Influenza A, POC: NEGATIVE

## 2014-10-28 MED ORDER — CHLORPHEN-PE-ACETAMINOPHEN 4-10-325 MG PO TABS: 1.0000 | ORAL_TABLET | Freq: Four times a day (QID) | ORAL | Status: AC | PRN

## 2014-10-28 MED ORDER — AMOXICILLIN-POT CLAVULANATE 875-125 MG PO TABS: 1.0000 | ORAL_TABLET | Freq: Two times a day (BID) | ORAL | Status: AC

## 2014-10-28 NOTE — Progress Notes (Signed)
Subjective:    Patient ID: Erin House, female    DOB: February 24, 1951, 64 y.o.   MRN: [id number]  HPI Ms. Kuan, a 64 year old female with a history of diabetes, hypertension and hyperlipidemia presents with a history of sinus pressure, sinus headache, runny nose, chest congestion and nasal congestion.   Ms. Herzberg states that she has taken over the counter Zycam and Thera-flu with minimal relief. Patient states that she has had intermittent fevers, but is currently afebrile. She denies being in contact with anyone with current symptoms.   Past Medical History  Diagnosis Date  . Diabetes mellitus without complication   . Hyperlipemia   . Hypertension   . Arthritis     knees  . Sleep apnea     has CPAP but does not wear every night  . 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 Marital Status: Married    Spouse Name: N/A  . Number of Children: N/A  . Years of Education: N/A   Occupational History  . Not on file.   Social History Main Topics  . Smoking status: Former Smoker    Quit date: 05/24/1993  . Smokeless tobacco: Never Used  . Alcohol Use: 0.0 oz/week    0 Standard drinks or equivalent per week     Comment: occasionlly  . Drug Use: No  . Sexual Activity: Not on file   Other Topics Concern  . Not on file   Social History Narrative   Review of Systems  Constitutional: Negative.  Negative for fever and fatigue.  HENT: Negative.   Eyes: Negative.   Respiratory: Negative.   Cardiovascular: Negative.   Gastrointestinal: Negative.   Endocrine: Negative.   Genitourinary: Negative.   Musculoskeletal: Negative.   Skin: Negative.   Allergic/Immunologic: Negative.   Neurological: Negative.   Hematological: Negative.   Psychiatric/Behavioral: Negative.        Objective:   Physical Exam  Constitutional: She is oriented to person, place, and time. She appears well-developed and well-nourished.  HENT:  Head: Normocephalic and atraumatic.   Right Ear: Hearing, tympanic membrane, external ear and ear canal normal. No swelling or tenderness. No decreased hearing is noted.  Left Ear: Hearing, tympanic membrane, external ear and ear canal normal. No swelling or tenderness.  Nose: Mucosal edema present. Right sinus exhibits maxillary sinus tenderness. Left sinus exhibits maxillary sinus tenderness.  Mouth/Throat: Posterior oropharyngeal erythema present.  Eyes: Conjunctivae and EOM are normal. Pupils are equal, round, and reactive to light.  Neck: Normal range of motion. Neck supple.  Cardiovascular: Normal rate, regular rhythm, normal heart sounds and intact distal pulses.   Pulmonary/Chest: Effort normal and breath sounds normal.  Abdominal: Soft. Bowel sounds are normal.  Musculoskeletal: Normal range of motion.  Neurological: She is alert and oriented to person, place, and time. She has normal reflexes.  Skin: Skin is warm and dry.  Psychiatric: She has a normal mood and affect. Her behavior is normal. Judgment and thought content normal.        BP 164/62 mmHg  Pulse 75  Temp(Src) 98.9 F (37.2 C) (Oral)  Resp 16  Ht 5' (1.524 m)  Wt 245 lb (111.131 kg)  BMI 47.85 kg/m2 Assessment & Plan:  1. Acute maxillary sinusitis, recurrence not specified Recommend that Ms. Grzesiak increase rest and handwashing. Recommend OTC nasal saline for increase nasal congestion and sinus pressure. Increase water intake to 4-5 glasses per day.  -  amoxicillin-clavulanate (AUGMENTIN) 875-125 MG per tablet; Take 1 tablet by mouth 2 (two) times daily.  Dispense: 20 tablet; Refill: 0  2. Cough - Chlorphen-PE-Acetaminophen 4-10-325 MG TABS; Take 1 tablet by mouth every 6 (six) hours as needed.  Dispense: 20 tablet; Refill: 0  3. Sinus pressure - Chlorphen-PE-Acetaminophen 4-10-325 MG TABS; Take 1 tablet by mouth every 6 (six) hours as needed.  Dispense: 20 tablet; Refill: 0  4. History of fever Recommend Tylenol 500 mg every 6 hours as needed  for mild to moderate pain and fever.  - Influenza A/B 5. Essential hypertension Blood pressure elevated today. Patient was evaluated 1 week ago for HBP, blood pressure was at goal. Recommend that patient return in 1 week as acute illness subsides for a blood pressure follow-up.   RTC: As previously scheduled   Dorena Dew, FNP

## 2014-10-28 NOTE — Patient Instructions (Signed)

## 2014-11-05 ENCOUNTER — Other Ambulatory Visit: Payer: Medicare Other

## 2014-11-08 ENCOUNTER — Other Ambulatory Visit (INDEPENDENT_AMBULATORY_CARE_PROVIDER_SITE_OTHER): Payer: Medicare Other

## 2014-11-08 DIAGNOSIS — IMO0002 Reserved for concepts with insufficient information to code with codable children: Secondary | ICD-10-CM

## 2014-11-08 DIAGNOSIS — E785 Hyperlipidemia, unspecified: Secondary | ICD-10-CM

## 2014-11-08 DIAGNOSIS — I10 Essential (primary) hypertension: Secondary | ICD-10-CM

## 2014-11-08 DIAGNOSIS — E1165 Type 2 diabetes mellitus with hyperglycemia: Secondary | ICD-10-CM

## 2014-11-08 DIAGNOSIS — R319 Hematuria, unspecified: Secondary | ICD-10-CM

## 2014-11-08 LAB — POCT URINALYSIS DIP (DEVICE)
Bilirubin Urine: NEGATIVE
Glucose, UA: NEGATIVE mg/dL
Hgb urine dipstick: NEGATIVE
Ketones, ur: NEGATIVE mg/dL
Leukocytes, UA: NEGATIVE
Nitrite: NEGATIVE
Protein, ur: NEGATIVE mg/dL
Specific Gravity, Urine: 1.02 (ref 1.005–1.030)
Urobilinogen, UA: 1 mg/dL (ref 0.0–1.0)
pH: 6.5 (ref 5.0–8.0)

## 2015-01-30 ENCOUNTER — Encounter: Payer: Self-pay | Admitting: Family Medicine

## 2015-01-30 ENCOUNTER — Ambulatory Visit (INDEPENDENT_AMBULATORY_CARE_PROVIDER_SITE_OTHER): Payer: Medicare Other | Admitting: Family Medicine

## 2015-01-30 VITALS — BP 130/61 | HR 67 | Temp 97.9°F | Resp 16 | Ht 60.25 in | Wt 242.0 lb

## 2015-01-30 DIAGNOSIS — M1990 Unspecified osteoarthritis, unspecified site: Secondary | ICD-10-CM | POA: Diagnosis not present

## 2015-01-30 DIAGNOSIS — E1165 Type 2 diabetes mellitus with hyperglycemia: Secondary | ICD-10-CM

## 2015-01-30 DIAGNOSIS — IMO0001 Reserved for inherently not codable concepts without codable children: Secondary | ICD-10-CM

## 2015-01-30 DIAGNOSIS — Z23 Encounter for immunization: Secondary | ICD-10-CM | POA: Diagnosis not present

## 2015-01-30 DIAGNOSIS — G4733 Obstructive sleep apnea (adult) (pediatric): Secondary | ICD-10-CM

## 2015-01-30 DIAGNOSIS — I10 Essential (primary) hypertension: Secondary | ICD-10-CM

## 2015-01-30 LAB — CBC WITH DIFFERENTIAL/PLATELET
Basophils Absolute: 0 10*3/uL (ref 0.0–0.1)
Basophils Relative: 0 % (ref 0–1)
EOS ABS: 0.1 10*3/uL (ref 0.0–0.7)
EOS PCT: 2 % (ref 0–5)
HCT: 37.3 % (ref 36.0–46.0)
Hemoglobin: 11.7 g/dL — ABNORMAL LOW (ref 12.0–15.0)
Lymphocytes Relative: 41 % (ref 12–46)
Lymphs Abs: 2.7 10*3/uL (ref 0.7–4.0)
MCH: 23.9 pg — AB (ref 26.0–34.0)
MCHC: 31.4 g/dL (ref 30.0–36.0)
MCV: 76.3 fL — AB (ref 78.0–100.0)
MONO ABS: 0.3 10*3/uL (ref 0.1–1.0)
MONOS PCT: 5 % (ref 3–12)
MPV: 10 fL (ref 8.6–12.4)
Neutro Abs: 3.4 10*3/uL (ref 1.7–7.7)
Neutrophils Relative %: 52 % (ref 43–77)
PLATELETS: 194 10*3/uL (ref 150–400)
RBC: 4.89 MIL/uL (ref 3.87–5.11)
RDW: 17.4 % — ABNORMAL HIGH (ref 11.5–15.5)
WBC: 6.5 10*3/uL (ref 4.0–10.5)

## 2015-01-30 LAB — POCT URINALYSIS DIP (DEVICE)
BILIRUBIN URINE: NEGATIVE
GLUCOSE, UA: NEGATIVE mg/dL
Hgb urine dipstick: NEGATIVE
KETONES UR: NEGATIVE mg/dL
Leukocytes, UA: NEGATIVE
Nitrite: NEGATIVE
PH: 7 (ref 5.0–8.0)
Protein, ur: 30 mg/dL — AB
Specific Gravity, Urine: 1.025 (ref 1.005–1.030)
Urobilinogen, UA: 2 mg/dL — ABNORMAL HIGH (ref 0.0–1.0)

## 2015-01-30 LAB — BASIC METABOLIC PANEL
BUN: 21 mg/dL (ref 7–25)
CALCIUM: 9.5 mg/dL (ref 8.6–10.4)
CO2: 29 mmol/L (ref 20–31)
Chloride: 100 mmol/L (ref 98–110)
Creat: 0.6 mg/dL (ref 0.50–0.99)
GLUCOSE: 137 mg/dL — AB (ref 65–99)
Potassium: 3.9 mmol/L (ref 3.5–5.3)
Sodium: 140 mmol/L (ref 135–146)

## 2015-01-30 MED ORDER — CELECOXIB 100 MG PO CAPS: 100.0000 mg | ORAL_CAPSULE | Freq: Two times a day (BID) | ORAL | Status: DC

## 2015-01-30 NOTE — Progress Notes (Signed)
Subjective:    Patient ID: Erin House, female    DOB: 20-Dec-1950, 64 y.o.   MRN: [id number]  Hypertension Pertinent negatives include no shortness of breath.  Diabetes Pertinent negatives for hypoglycemia include no dizziness. Pertinent negatives for diabetes include no fatigue, no polydipsia, no polyphagia and no polyuria.   Pertinent negatives for diabetes include no fatigue, no polydipsia, no polyphagia and no polyuria.   Ms. Brockman, a 64 year old, presents for a 3 month follow up of type II diabetes mellitus and hypertension. She states that she has been checking her blood sugars daily and average daily glucose has ranged between 125-140 since last visit. She states that she is continuing to exercise at the local [employer] and follow a balanced diet. Ms. Conaty denies headache, weakness, fatigue, polydipsia, polyuria, polyphagia, nausea, vomiting, or diarrhea.   Ms. Lowman is also follow up for hypertension. Her blood pressure has improved since adding medications and modifying diet. She is exercising and is adherent to low salt diet. Blood pressure is well controlled at home. Patient denies chest pain, dyspnea, fatigue, orthopnea, palpitations, syncope and tachypnea. Cardiovascular risk factors: diabetes mellitus, dyslipidemia and obesity (BMI >= 30 kg/m2).   Past Medical History  Diagnosis Date  . Diabetes mellitus without complication   . Hyperlipemia   . Hypertension   . Arthritis     knees  . Sleep apnea     has CPAP but does not wear every night  . 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 Marital Status: Married    Spouse Name: N/A  . Number of Children: N/A  . Years of Education: N/A   Occupational History  . Not on file.   Social History Main Topics  . Smoking status: Former Smoker    Quit date: 05/24/1993  . Smokeless tobacco: Never Used  . Alcohol Use: 0.0 oz/week    0 Standard drinks or equivalent per week     Comment:  occasionlly  . Drug Use: No  . Sexual Activity: Not on file   Other Topics Concern  . Not on file   Social History Narrative  No Known Allergies  Immunization History  Administered Date(s) Administered  . Influenza,inj,Quad PF,36+ Mos 02/18/2014, 01/30/2015  . Pneumococcal Polysaccharide-23 04/11/2014  . Tdap 07/15/2014  . Zoster 04/11/2014   Review of Systems  Constitutional: Negative.  Negative for fatigue.  HENT: Negative.   Eyes: Negative.   Respiratory: Negative.  Negative for shortness of breath.   Cardiovascular: Negative.   Gastrointestinal: Negative.   Endocrine: Negative.  Negative for polydipsia, polyphagia and polyuria.  Genitourinary: Negative.  Negative for dysuria and frequency.  Musculoskeletal: Positive for arthralgias (primarily to left shoulder, lower back, and left knee).  Skin: Negative.   Allergic/Immunologic: Negative.   Neurological: Negative.  Negative for dizziness and light-headedness.  Hematological: Negative.   Psychiatric/Behavioral: Positive for sleep disturbance.       Objective:   Physical Exam  Constitutional: She is oriented to person, place, and time. She appears well-developed and well-nourished.  HENT:  Head: Normocephalic and atraumatic.  Right Ear: Hearing, tympanic membrane, external ear and ear canal normal. No swelling or tenderness. No decreased hearing is noted.  Left Ear: Hearing, tympanic membrane, external ear and ear canal normal. No swelling or tenderness.  Eyes: Conjunctivae and EOM are normal. Pupils are equal, round, and reactive to light. Lids are everted and swept, no foreign bodies found.  Neck:  Normal range of motion. Neck supple.  Cardiovascular: Normal rate, regular rhythm, normal heart sounds and intact distal pulses.   Pulmonary/Chest: Effort normal and breath sounds normal.  Abdominal: Soft. Normal appearance and bowel sounds are normal.  Musculoskeletal:       Left shoulder: She exhibits decreased range of  motion, tenderness and pain.       Left knee: She exhibits decreased range of motion and swelling. Tenderness found.  Neurological: She is alert and oriented to person, place, and time. She has normal reflexes.  Skin: Skin is warm and dry.  Psychiatric: She has a normal mood and affect. Her behavior is normal. Judgment and thought content normal.        BP 130/61 mmHg  Pulse 67  Temp(Src) 97.9 F (36.6 C) (Oral)  Resp 16  Ht 5' 0.25" (1.53 m)  Wt 242 lb (109.77 kg)  BMI 46.89 kg/m2 Assessment & Plan:  1. Essential hypertension - POCT urinalysis dipstick - Basic Metabolic Panel  2. Uncontrolled diabetes mellitus type 2 without complications Reviewed previous laboratory results. Will continue with current medications and will review laboratory results as they become available.  - CBC with Differential - Hemoglobin A1c - POCT CBG monitoring; Standing - Basic Metabolic Panel - POCT CBG monitoring  3. Obesity, morbid Continue water aerobics. Recommend low fat, low carbohydrate diet divided over 6 small meals.   4. Arthritis Patient reports generalized arthritis pain, primarily to left shoulder, left knee, and lower back.  - celecoxib (CELEBREX) 100 MG capsule; Take 1 capsule (100 mg total) by mouth 2 (two) times daily.  Dispense: 60 capsule; Refill: 0  5. Obstructive sleep apnea Patient reports that husband has witnessed snoring and apnea. She also reports daytime sleepiness. Will send for a sleep night sleep study.  - Split night study; Future   6. Need for immunization against influenza - Flu Vaccine QUAD 36+ mos IM (Fluarix)   Preventative:    Will send for mammogram and bone density test in January '17 Continue water aerobics and carbohydrate modified diet.  Patient has written information concerning diet plan   RTC: 3 months

## 2015-01-30 NOTE — Patient Instructions (Addendum)
Arthritis:   Keep moving: Recommend water aerobics  Recommend heating pad for lower back  Start Celebrex 100 mg twice daily  Will notify patient with laboratory results.     Diabetes and Foot Care Diabetes may cause you to have problems because of poor blood supply (circulation) to your feet and legs. This may cause the skin on your feet to become thinner, break easier, and heal more slowly. Your skin may become dry, and the skin may peel and crack. You may also have nerve damage in your legs and feet causing decreased feeling in them. You may not notice minor injuries to your feet that could lead to infections or more serious problems. Taking care of your feet is one of the most important things you can do for yourself.  HOME CARE INSTRUCTIONS  Wear shoes at all times, even in the house. Do not go barefoot. Bare feet are easily injured.  Check your feet daily for blisters, cuts, and redness. If you cannot see the bottom of your feet, use a mirror or ask someone for help.  Wash your feet with warm water (do not use hot water) and mild soap. Then pat your feet and the areas between your toes until they are completely dry. Do not soak your feet as this can dry your skin.  Apply a moisturizing lotion or petroleum jelly (that does not contain alcohol and is unscented) to the skin on your feet and to dry, brittle toenails. Do not apply lotion between your toes.  Trim your toenails straight across. Do not dig under them or around the cuticle. File the edges of your nails with an emery board or nail file.  Do not cut corns or calluses or try to remove them with medicine.  Wear clean socks or stockings every day. Make sure they are not too tight. Do not wear knee-high stockings since they may decrease blood flow to your legs.  Wear shoes that fit properly and have enough cushioning. To break in new shoes, wear them for just a few hours a day. This prevents you from injuring your feet.  Always look in your shoes before you put them on to be sure there are no objects inside.  Do not cross your legs. This may decrease the blood flow to your feet.  If you find a minor scrape, cut, or break in the skin on your feet, keep it and the skin around it clean and dry. These areas may be cleansed with mild soap and water. Do not cleanse the area with peroxide, alcohol, or iodine.  When you remove an adhesive bandage, be sure not to damage the skin around it.  If you have a wound, look at it several times a day to make sure it is healing.  Do not use heating pads or hot water bottles. They may burn your skin. If you have lost feeling in your feet or legs, you may not know it is happening until it is too late.  Make sure your health care provider performs a complete foot exam at least annually or more often if you have foot problems. Report any cuts, sores, or bruises to your health care provider immediately. SEEK MEDICAL CARE IF:   You have an injury that is not healing.  You have cuts or breaks in the skin.  You have an ingrown nail.  You notice redness on your legs or feet.  You feel burning or tingling in your legs or feet.  You have pain or cramps in your legs and feet.  Your legs or feet are numb.  Your feet always feel cold. SEEK IMMEDIATE MEDICAL CARE IF:   There is increasing redness, swelling, or pain in or around a wound.  There is a red line that goes up your leg.  Pus is coming from a wound.  You develop a fever or as directed by your health care provider.  You notice a bad smell coming from an ulcer or wound. Document Released: 05/07/2000 Document Revised: 01/10/2013 Document Reviewed: 10/17/2012 [facility] Patient Information 2015 [place], [place]. This information is not intended to replace advice given to you by your health care provider. Make sure you discuss any questions you have with your health care provider. Diabetes and Exercise Exercising  regularly is important. It is not just about losing weight. It has many health benefits, such as:  Improving your overall fitness, flexibility, and endurance.  Increasing your bone density.  Helping with weight control.  Decreasing your body fat.  Increasing your muscle strength.  Reducing stress and tension.  Improving your overall health. People with diabetes who exercise gain additional benefits because exercise:  Reduces appetite.  Improves the body's use of blood sugar (glucose).  Helps lower or control blood glucose.  Decreases blood pressure.  Helps control blood lipids (such as cholesterol and triglycerides).  Improves the body's use of the hormone insulin by:  Increasing the body's insulin sensitivity.  Reducing the body's insulin needs.  Decreases the risk for heart disease because exercising:  Lowers cholesterol and triglycerides levels.  Increases the levels of good cholesterol (such as high-density lipoproteins [HDL]) in the body.  Lowers blood glucose levels. YOUR ACTIVITY PLAN  Choose an activity that you enjoy and set realistic goals. Your health care provider or diabetes educator can help you make an activity plan that works for you. Exercise regularly as directed by your health care provider. This includes:  Performing resistance training twice a week such as push-ups, sit-ups, lifting weights, or using resistance bands.  Performing 150 minutes of cardio exercises each week such as walking, running, or playing sports.  Staying active and spending no more than 90 minutes at one time being inactive. Even short bursts of exercise are good for you. Three 10-minute sessions spread throughout the day are just as beneficial as a single 30-minute session. Some exercise ideas include:  Taking the dog for a walk.  Taking the stairs instead of the elevator.  Dancing to your favorite song.  Doing an exercise video.  Doing your favorite exercise with a  friend. RECOMMENDATIONS FOR EXERCISING WITH TYPE 1 OR TYPE 2 DIABETES   Check your blood glucose before exercising. If blood glucose levels are greater than 240 mg/dL, check for urine ketones. Do not exercise if ketones are present.  Avoid injecting insulin into areas of the body that are going to be exercised. For example, avoid injecting insulin into:  The arms when playing tennis.  The legs when jogging.  Keep a record of:  Food intake before and after you exercise.  Expected peak times of insulin action.  Blood glucose levels before and after you exercise.  The type and amount of exercise you have done.  Review your records with your health care provider. Your health care provider will help you to develop guidelines for adjusting food intake and insulin amounts before and after exercising.  If you take insulin or oral hypoglycemic agents, watch for signs and symptoms of hypoglycemia. They  include:  Dizziness.  Shaking.  Sweating.  Chills.  Confusion.  Drink plenty of water while you exercise to prevent dehydration or heat stroke. Body water is lost during exercise and must be replaced.  Talk to your health care provider before starting an exercise program to make sure it is safe for you. Remember, almost any type of activity is better than none. Document Released: 07/31/2003 Document Revised: 09/24/2013 Document Reviewed: 10/17/2012 [facility] Patient Information 2015 [place], [place]. This information is not intended to replace advice given to you by your health care provider. Make sure you discuss any questions you have with your health care provider. DASH Eating Plan DASH stands for "Dietary Approaches to Stop Hypertension." The DASH eating plan is a healthy eating plan that has been shown to reduce high blood pressure (hypertension). Additional health benefits may include reducing the risk of type 2 diabetes mellitus, heart disease, and stroke. The DASH eating plan may  also help with weight loss. WHAT DO I NEED TO KNOW ABOUT THE DASH EATING PLAN? For the DASH eating plan, you will follow these general guidelines:  Choose foods with a percent daily value for sodium of less than 5% (as listed on the food label).  Use salt-free seasonings or herbs instead of table salt or sea salt.  Check with your health care provider or pharmacist before using salt substitutes.  Eat lower-sodium products, often labeled as "lower sodium" or "no salt added."  Eat fresh foods.  Eat more vegetables, fruits, and low-fat dairy products.  Choose whole grains. Look for the word "whole" as the first word in the ingredient list.  Choose fish and skinless chicken or [place] more often than red meat. Limit fish, poultry, and meat to 6 oz (170 g) each day.  Limit sweets, desserts, sugars, and sugary drinks.  Choose heart-healthy fats.  Limit cheese to 1 oz (28 g) per day.  Eat more home-cooked food and less restaurant, buffet, and fast food.  Limit fried foods.  Cook foods using methods other than frying.  Limit canned vegetables. If you do use them, rinse them well to decrease the sodium.  When eating at a restaurant, ask that your food be prepared with less salt, or no salt if possible. WHAT FOODS CAN I EAT? Seek help from a dietitian for individual calorie needs. Grains Whole grain or whole wheat bread. Brown rice. Whole grain or whole wheat pasta. Quinoa, bulgur, and whole grain cereals. Low-sodium cereals. Corn or whole wheat flour tortillas. Whole grain cornbread. Whole grain crackers. Low-sodium crackers. Vegetables Fresh or frozen vegetables (raw, steamed, roasted, or grilled). Low-sodium or reduced-sodium tomato and vegetable juices. Low-sodium or reduced-sodium tomato sauce and paste. Low-sodium or reduced-sodium canned vegetables.  Fruits All fresh, canned (in natural juice), or frozen fruits. Meat and Other Protein Products Ground beef (85% or leaner),  grass-fed beef, or beef trimmed of fat. Skinless chicken or [place]. Ground chicken or [place]. Pork trimmed of fat. All fish and seafood. Eggs. Dried beans, peas, or lentils. Unsalted nuts and seeds. Unsalted canned beans. Dairy Low-fat dairy products, such as skim or 1% milk, 2% or reduced-fat cheeses, low-fat ricotta or cottage cheese, or plain low-fat yogurt. Low-sodium or reduced-sodium cheeses. Fats and Oils Tub margarines without trans fats. Light or reduced-fat mayonnaise and salad dressings (reduced sodium). Avocado. Safflower, olive, or canola oils. Natural peanut or almond butter. Other Unsalted popcorn and pretzels. The items listed above may not be a complete list of recommended foods or beverages. Contact your  dietitian for more options. WHAT FOODS ARE NOT RECOMMENDED? Grains White bread. White pasta. White rice. Refined cornbread. Bagels and croissants. Crackers that contain trans fat. Vegetables Creamed or fried vegetables. Vegetables in a cheese sauce. Regular canned vegetables. Regular canned tomato sauce and paste. Regular tomato and vegetable juices. Fruits Dried fruits. Canned fruit in light or heavy syrup. Fruit juice. Meat and Other Protein Products Fatty cuts of meat. Ribs, chicken wings, bacon, sausage, bologna, salami, chitterlings, fatback, hot dogs, bratwurst, and packaged luncheon meats. Salted nuts and seeds. Canned beans with salt. Dairy Whole or 2% milk, cream, half-and-half, and cream cheese. Whole-fat or sweetened yogurt. Full-fat cheeses or blue cheese. Nondairy creamers and whipped toppings. Processed cheese, cheese spreads, or cheese curds. Condiments Onion and garlic salt, seasoned salt, table salt, and sea salt. Canned and packaged gravies. Worcestershire sauce. Tartar sauce. Barbecue sauce. Teriyaki sauce. Soy sauce, including reduced sodium. Steak sauce. Fish sauce. Oyster sauce. Cocktail sauce. Horseradish. Ketchup and mustard. Meat flavorings and  tenderizers. Bouillon cubes. Hot sauce. Tabasco sauce. Marinades. Taco seasonings. Relishes. Fats and Oils Butter, stick margarine, lard, shortening, ghee, and bacon fat. Coconut, palm kernel, or palm oils. Regular salad dressings. Other Pickles and olives. Salted popcorn and pretzels. The items listed above may not be a complete list of foods and beverages to avoid. Contact your dietitian for more information. WHERE CAN I FIND MORE INFORMATION? National Heart, Lung, and Blood Institute: travelstabloid.com Document Released: 04/29/2011 Document Revised: 09/24/2013 Document Reviewed: 03/14/2013 [facility] Patient Information 2015 [place], [place]. This information is not intended to replace advice given to you by your health care provider. Make sure you discuss any questions you have with your health care provider.

## 2015-01-31 ENCOUNTER — Telehealth: Payer: Self-pay | Admitting: Family Medicine

## 2015-01-31 ENCOUNTER — Ambulatory Visit: Payer: Medicare Other | Admitting: Family Medicine

## 2015-01-31 LAB — HEMOGLOBIN A1C
Hgb A1c MFr Bld: 7.1 % — ABNORMAL HIGH (ref <5.7)
Mean Plasma Glucose: 157 mg/dL — ABNORMAL HIGH (ref <117)

## 2015-01-31 NOTE — Telephone Encounter (Signed)
Patient needs a prescription for a mastectomy bra.

## 2015-01-31 NOTE — Telephone Encounter (Signed)
Please advise. Thanks.  

## 2015-01-31 NOTE — Telephone Encounter (Signed)
Reviewed labs from 01/30/2015. Patient's hemoglobin a1C decreased from 7.7 to 7.1, patient will continue with current anti-diabetic regimen. BUN and creatinine are within a normal range. Blood pressure stable, will continue current anti-hypertensive regimen. No proteinuria present. Continue lowfat, low carbohydrate diet and exercise regimen.  Will follow-up in office in 3 months.   Dorena Dew, FNP   .

## 2015-01-31 NOTE — Telephone Encounter (Signed)
Advised patient of lab results and instructions to continue current regiment of meds and follow low fat/carb diet and to increase exercise. Patient verbalized understanding. Thanks!

## 2015-01-31 NOTE — Telephone Encounter (Signed)
Erin House has never been evaluated in our clinic for a mastectectomy bra. She will need to follow up with oncologist.   Dorena Dew, FNP

## 2015-02-06 ENCOUNTER — Telehealth: Payer: Self-pay | Admitting: Family Medicine

## 2015-02-06 DIAGNOSIS — Z9010 Acquired absence of unspecified breast and nipple: Secondary | ICD-10-CM

## 2015-02-06 NOTE — Telephone Encounter (Signed)
-----   Message from Adelina Mings, LPN sent at 3/84/5364  3:50 PM EDT ----- Regarding: Mastectomy bra  [place], Can you please write a rx for this? Per [place] with [facility] it only needs to say "Post mastectomy supplies" once she is seen with them they will fax a form for [place] to fill out. Please advise, Thanks!  ----- Message -----    From: Gae Dry, NT    Sent: 02/06/2015   8:06 AM      To: Adelina Mings, LPN  Ms Woolworth would like the prescription for the mastectomy bra to be sent  to [facility], phone [phone]. Her appointment is Tuesday. 02/11/15. Thanks.

## 2015-02-07 NOTE — Telephone Encounter (Signed)
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 Patient is requesting a post mastectomy bra to fit prosthesis. Order form sent to [facility] for 6 mastectomy bras with 3 refills.    Dorena Dew, FNP

## 2015-02-27 ENCOUNTER — Ambulatory Visit (INDEPENDENT_AMBULATORY_CARE_PROVIDER_SITE_OTHER): Payer: Medicare Other | Admitting: Family Medicine

## 2015-02-27 ENCOUNTER — Encounter: Payer: Self-pay | Admitting: Family Medicine

## 2015-02-27 VITALS — BP 156/59 | HR 75 | Temp 98.1°F | Resp 16 | Ht 60.25 in | Wt 248.0 lb

## 2015-02-27 DIAGNOSIS — R059 Cough, unspecified: Secondary | ICD-10-CM

## 2015-02-27 DIAGNOSIS — R05 Cough: Secondary | ICD-10-CM | POA: Diagnosis not present

## 2015-02-27 DIAGNOSIS — R0982 Postnasal drip: Secondary | ICD-10-CM | POA: Diagnosis not present

## 2015-02-27 DIAGNOSIS — R0989 Other specified symptoms and signs involving the circulatory and respiratory systems: Secondary | ICD-10-CM

## 2015-02-27 DIAGNOSIS — R0689 Other abnormalities of breathing: Secondary | ICD-10-CM

## 2015-02-27 DIAGNOSIS — R0609 Other forms of dyspnea: Secondary | ICD-10-CM | POA: Diagnosis not present

## 2015-02-27 DIAGNOSIS — R0600 Dyspnea, unspecified: Secondary | ICD-10-CM | POA: Diagnosis not present

## 2015-02-27 MED ORDER — CHLORPHEN-PE-ACETAMINOPHEN 4-10-325 MG PO TABS: 1.0000 | ORAL_TABLET | Freq: Four times a day (QID) | ORAL | Status: DC | PRN

## 2015-02-27 NOTE — Patient Instructions (Signed)
Upper Respiratory Infection, Adult Most upper respiratory infections (URIs) are a viral infection of the air passages leading to the lungs. A URI affects the nose, throat, and upper air passages. The most common type of URI is nasopharyngitis and is typically referred to as "the common cold." URIs run their course and usually go away on their own. Most of the time, a URI does not require medical attention, but sometimes a bacterial infection in the upper airways can follow a viral infection. This is called a secondary infection. Sinus and middle ear infections are common types of secondary upper respiratory infections. Bacterial pneumonia can also complicate a URI. A URI can worsen asthma and chronic obstructive pulmonary disease (COPD). Sometimes, these complications can require emergency medical care and may be life threatening.  CAUSES Almost all URIs are caused by viruses. A virus is a type of germ and can spread from one person to another.  RISKS FACTORS You may be at risk for a URI if:   You smoke.   You have chronic heart or lung disease.  You have a weakened defense (immune) system.   You are very young or very old.   You have nasal allergies or asthma.  You work in crowded or poorly ventilated areas.  You work in health care facilities or schools. SIGNS AND SYMPTOMS  Symptoms typically develop 2-3 days after you come in contact with a cold virus. Most viral URIs last 7-10 days. However, viral URIs from the influenza virus (flu virus) can last 14-18 days and are typically more severe. Symptoms may include:   Runny or stuffy (congested) nose.   Sneezing.   Cough.   Sore throat.   Headache.   Fatigue.   Fever.   Loss of appetite.   Pain in your forehead, behind your eyes, and over your cheekbones (sinus pain).  Muscle aches.  DIAGNOSIS  Your health care provider may diagnose a URI by:  Physical exam.  Tests to check that your symptoms are not due to  another condition such as:  Strep throat.  Sinusitis.  Pneumonia.  Asthma. TREATMENT  A URI goes away on its own with time. It cannot be cured with medicines, but medicines may be prescribed or recommended to relieve symptoms. Medicines may help:  Reduce your fever.  Reduce your cough.  Relieve nasal congestion. HOME CARE INSTRUCTIONS   Take medicines only as directed by your health care provider.   Gargle warm saltwater or take cough drops to comfort your throat as directed by your health care provider.  Use a warm mist humidifier or inhale steam from a shower to increase air moisture. This may make it easier to breathe.  Drink enough fluid to keep your urine clear or pale yellow.   Eat soups and other clear broths and maintain good nutrition.   Rest as needed.   Return to work when your temperature has returned to normal or as your health care provider advises. You may need to stay home longer to avoid infecting others. You can also use a face mask and careful hand washing to prevent spread of the virus.  Increase the usage of your inhaler if you have asthma.   Do not use any tobacco products, including cigarettes, chewing tobacco, or electronic cigarettes. If you need help quitting, ask your health care provider. PREVENTION  The best way to protect yourself from getting a cold is to practice good hygiene.   Avoid oral or hand contact with people with cold   symptoms.   Wash your hands often if contact occurs.  There is no clear evidence that vitamin C, vitamin E, echinacea, or exercise reduces the chance of developing a cold. However, it is always recommended to get plenty of rest, exercise, and practice good nutrition.  SEEK MEDICAL CARE IF:   You are getting worse rather than better.   Your symptoms are not controlled by medicine.   You have chills.  You have worsening shortness of breath.  You have brown or red mucus.  You have yellow or brown nasal  discharge.  You have pain in your face, especially when you bend forward.  You have a fever.  You have swollen neck glands.  You have pain while swallowing.  You have white areas in the back of your throat. SEEK IMMEDIATE MEDICAL CARE IF:   You have severe or persistent:  Headache.  Ear pain.  Sinus pain.  Chest pain.  You have chronic lung disease and any of the following:  Wheezing.  Prolonged cough.  Coughing up blood.  A change in your usual mucus.  You have a stiff neck.  You have changes in your:  Vision.  Hearing.  Thinking.  Mood. MAKE SURE YOU:   Understand these instructions.  Will watch your condition.  Will get help right away if you are not doing well or get worse.   This information is not intended to replace advice given to you by your health care provider. Make sure you discuss any questions you have with your health care provider.   Document Released: 11/03/2000 Document Revised: 09/24/2014 Document Reviewed: 08/15/2013 Elsevier Interactive Patient Education 2016 [employer] Inc[employer]  

## 2015-02-27 NOTE — Progress Notes (Signed)
Subjective:    Patient ID: Erin House, female    DOB: 10/18/1950, 64 y.o.   MRN: [id number]  URI  This is a new problem. The current episode started in the past 7 days. The problem has been gradually improving. There has been no fever. Associated symptoms include coughing, headaches and swollen glands. Pertinent negatives include no abdominal pain, chest pain, congestion, diarrhea, dysuria, ear pain, joint pain, joint swelling, nausea, neck pain, plugged ear sensation, rash, rhinorrhea, sinus pain, sneezing, sore throat, vomiting or wheezing. She has tried nothing for the symptoms. The treatment provided no relief.   Past Medical History  Diagnosis Date  . Diabetes mellitus without complication   . Hyperlipemia   . Hypertension   . Arthritis     knees  . Sleep apnea     has CPAP but does not wear every night  . 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 Marital Status: Married    Spouse Name: N/A  . Number of Children: N/A  . Years of Education: N/A   Occupational History  . Not on file.   Social History Main Topics  . Smoking status: Former Smoker    Quit date: 05/24/1993  . Smokeless tobacco: Never Used  . Alcohol Use: 0.0 oz/week    0 Standard drinks or equivalent per week     Comment: occasionlly  . Drug Use: No  . Sexual Activity: Not on file   Other Topics Concern  . Not on file   Social History Narrative   Review of Systems  Constitutional: Positive for fatigue. Negative for fever.  HENT: Positive for postnasal drip. Negative for congestion, ear pain, rhinorrhea, sneezing and sore throat.   Eyes: Negative.  Negative for photophobia, redness and visual disturbance.  Respiratory: Positive for cough. Negative for chest tightness, shortness of breath and wheezing.   Cardiovascular: Negative.  Negative for chest pain.  Gastrointestinal: Negative.  Negative for nausea, vomiting, abdominal pain and diarrhea.  Endocrine: Negative.   Negative for polydipsia, polyphagia and polyuria.  Genitourinary: Negative.  Negative for dysuria.  Musculoskeletal: Negative.  Negative for joint pain and neck pain.  Skin: Negative.  Negative for rash.  Allergic/Immunologic: Negative.  Negative for immunocompromised state.  Neurological: Positive for headaches.  Hematological: Negative.   Psychiatric/Behavioral: Negative.        Objective:   Physical Exam  Constitutional: She is oriented to person, place, and time. She appears well-developed and well-nourished.  HENT:  Head: Normocephalic and atraumatic.  Right Ear: Hearing, tympanic membrane, external ear and ear canal normal.  Left Ear: Hearing, tympanic membrane, external ear and ear canal normal.  Nose: Rhinorrhea present. No sinus tenderness. Right sinus exhibits no maxillary sinus tenderness and no frontal sinus tenderness. Left sinus exhibits no maxillary sinus tenderness and no frontal sinus tenderness.  Mouth/Throat: Uvula is midline and mucous membranes are normal. No oral lesions. Oropharyngeal exudate present.  Eyes: Conjunctivae and EOM are normal. Pupils are equal, round, and reactive to light.  Neck: Normal range of motion. Neck supple.  Cardiovascular: Normal rate, regular rhythm, normal heart sounds and intact distal pulses.   Pulmonary/Chest: Effort normal and breath sounds normal. She has no wheezes. She has no rhonchi. She has no rales.  Abdominal: Soft. Normal appearance and bowel sounds are normal.  Musculoskeletal: Normal range of motion.  Neurological: She is alert and oriented to person, place, and time. She has normal reflexes.  Skin:  Skin is warm and dry.  Psychiatric: She has a normal mood and affect. Her behavior is normal. Judgment and thought content normal.   BP 156/59 mmHg  Pulse 75  Temp(Src) 98.1 F (36.7 C) (Oral)  Resp 16  Ht 5' 0.25" (1.53 m)  Wt 248 lb (112.492 kg)  BMI 48.06 kg/m2 Assessment & Plan:  1. Symptoms of upper respiratory  infection (URI)  I suspect that symptoms are viral at this point. I recommend that Ms. Sarsfield increase vitamin C intake, rest, and handwashing.  - Chlorphen-PE-Acetaminophen 4-10-325 MG TABS; Take 1 tablet by mouth every 6 (six) hours as needed.  Dispense: 20 tablet; Refill: 0  2. Cough  - Chlorphen-PE-Acetaminophen 4-10-325 MG TABS; Take 1 tablet by mouth every 6 (six) hours as needed.  Dispense: 20 tablet; Refill: 0  3. Post-nasal drip  - Chlorphen-PE-Acetaminophen 4-10-325 MG TABS; Take 1 tablet by mouth every 6 (six) hours as needed.  Dispense: 20 tablet; Refill: 0  RTC: Return to clinic for follow up of chronic conditions as scheduled   Dorena Dew, FNP

## 2015-03-11 ENCOUNTER — Ambulatory Visit (HOSPITAL_BASED_OUTPATIENT_CLINIC_OR_DEPARTMENT_OTHER): Payer: Medicare Other | Attending: Family Medicine | Admitting: Radiology

## 2015-03-11 DIAGNOSIS — G4761 Periodic limb movement disorder: Secondary | ICD-10-CM | POA: Diagnosis not present

## 2015-03-11 DIAGNOSIS — G4733 Obstructive sleep apnea (adult) (pediatric): Secondary | ICD-10-CM | POA: Diagnosis not present

## 2015-03-11 DIAGNOSIS — Z7984 Long term (current) use of oral hypoglycemic drugs: Secondary | ICD-10-CM | POA: Insufficient documentation

## 2015-03-11 DIAGNOSIS — R5383 Other fatigue: Secondary | ICD-10-CM | POA: Insufficient documentation

## 2015-03-11 DIAGNOSIS — Z6841 Body Mass Index (BMI) 40.0 and over, adult: Secondary | ICD-10-CM | POA: Diagnosis not present

## 2015-03-11 DIAGNOSIS — R0683 Snoring: Secondary | ICD-10-CM | POA: Insufficient documentation

## 2015-03-11 DIAGNOSIS — E669 Obesity, unspecified: Secondary | ICD-10-CM | POA: Diagnosis not present

## 2015-03-11 DIAGNOSIS — E119 Type 2 diabetes mellitus without complications: Secondary | ICD-10-CM | POA: Diagnosis not present

## 2015-03-11 DIAGNOSIS — Z79899 Other long term (current) drug therapy: Secondary | ICD-10-CM | POA: Insufficient documentation

## 2015-03-16 DIAGNOSIS — G4733 Obstructive sleep apnea (adult) (pediatric): Secondary | ICD-10-CM | POA: Diagnosis not present

## 2015-03-16 NOTE — Progress Notes (Signed)
Patient Name: Erin House, Erin House Date: 03/11/2015 Gender: Female D.O.B: 01-22-1951 Age (years): 64 Referring Provider: Cammie Sickle Height (inches): 61 Interpreting Physician: Baird Lyons MD, ABSM Weight (lbs): 240 RPSGT: Carolin Coy BMI: 45 MRN: [id number] Neck Size: 13.50 CLINICAL INFORMATION Sleep Study Type: Split Night CPAP Indication for sleep study: Diabetes, Fatigue, Obesity, OSA, Snoring Epworth Sleepiness Score: 8  SLEEP STUDY TECHNIQUE As per the AASM Manual for the Scoring of Sleep and Associated Events v2.3 (April 2016) with a hypopnea requiring 4% desaturations. The channels recorded and monitored were frontal, central and occipital EEG, electrooculogram (EOG), submentalis EMG (chin), nasal and oral airflow, thoracic and abdominal wall motion, anterior tibialis EMG, snore microphone, electrocardiogram, and pulse oximetry. Continuous positive airway pressure (CPAP) was initiated when the patient met split night criteria and was titrated according to treat sleep-disordered breathing.  MEDICATIONS Medications taken by the patient : charted for review Medications administered by patient during sleep study : crestor, metformin, celebrex.  RESPIRATORY PARAMETERS Diagnostic Total AHI (/hr): 26.5 RDI (/hr): 36.2 OA Index (/hr): 3.4 CA Index (/hr): 0.4 REM AHI (/hr): 84.7 NREM AHI (/hr): 8.3 Supine AHI (/hr): N/A Non-supine AHI (/hr): 26.55 Min O2 Sat (%): 75.00 Mean O2 (%): 94.51 Time below 88% (min): 5.2     Titration Optimal Pressure (cm): 13 AHI at Optimal Pressure (/hr): 2.0 Min O2 at Optimal Pressure (%): 89.00 Supine % at Optimal (%): N/A Sleep % at Optimal (%): N/A      SLEEP ARCHITECTURE The recording time for the entire night was 413.0 minutes. During a baseline period of 170.4 minutes, the patient slept for 142.4 minutes in REM and nonREM, yielding a sleep efficiency of 83.6%. Sleep onset after lights out was 12.0 minutes with a REM latency of  107.5 minutes. The patient spent 10.89% of the night in stage N1 sleep, 65.23% in stage N2 sleep, 0.00% in stage N3 and 23.88% in REM. During the titration period of 240.5 minutes, the patient slept for 231.0 minutes in REM and nonREM, yielding a sleep efficiency of 96.1%. Sleep onset after CPAP initiation was 1.4 minutes with a REM latency of 107.5 minutes. The patient spent 3.68% of the night in stage N1 sleep, 72.29% in stage N2 sleep, 0.00% in stage N3 and 24.03% in REM.  CARDIAC DATA The 2 lead EKG demonstrated sinus rhythm. The mean heart rate was 75.39 beats per minute. Other EKG findings include: None.  LEG MOVEMENT DATA The total Periodic Limb Movements of Sleep (PLMS) were 170. The PLMS index was 27.16 .  IMPRESSIONS - Moderate obstructive sleep apnea occurred during the diagnostic portion of the study(AHI = 26.5/hour). An optimal PAP pressure could not be selected for this patient based on the available study data. - No significant central sleep apnea occurred during the diagnostic portion of the study (CAI = 0.4/hour). - Mild oxygen desaturation was noted during the diagnostic portion of the study (Min O2 = 75.00%). - The patient snored with Moderate snoring volume during the diagnostic portion of the study. - No cardiac abnormalities were noted during this study. - Severe periodic limb movements of sleep occurred during the study but with little associated sleep disturbance . DIAGNOSIS - Obstructive Sleep Apnea (327.23 [G47.33 ICD-10]) - Periodic Limb Movement Syndrome (327.51 [G47.61 ICD-10])  RECOMMENDATIONS - Recommend CPAP trial at 10 cwp. The patient used a medium Resmed AirFit 10 For Her mask with heated humidifier. - Avoid alcohol, sedatives and other CNS depressants that may worsen sleep apnea and disrupt normal sleep  architecture. - Sleep hygiene should be reviewed to assess factors that may improve sleep quality. - Weight management and regular exercise should be  initiated or continued.  Deneise Lever Diplomate, American Board of Sleep Medicine  ELECTRONICALLY SIGNED ON:  03/16/2015, 11:32 AM [place] PH: [phone]   FX: [phone] [place]

## 2015-03-17 ENCOUNTER — Telehealth: Payer: Self-pay | Admitting: Hematology

## 2015-03-17 ENCOUNTER — Encounter (HOSPITAL_COMMUNITY): Payer: Self-pay | Admitting: Cardiology

## 2015-03-17 ENCOUNTER — Emergency Department (HOSPITAL_COMMUNITY)
Admission: EM | Admit: 2015-03-17 | Discharge: 2015-03-17 | Disposition: A | Discharge status: Home / Self Care | Payer: Medicare Other | Attending: Emergency Medicine | Admitting: Emergency Medicine

## 2015-03-17 DIAGNOSIS — Y9289 Other specified places as the place of occurrence of the external cause: Secondary | ICD-10-CM | POA: Insufficient documentation

## 2015-03-17 DIAGNOSIS — Z9981 Dependence on supplemental oxygen: Secondary | ICD-10-CM | POA: Insufficient documentation

## 2015-03-17 DIAGNOSIS — Z87891 Personal history of nicotine dependence: Secondary | ICD-10-CM | POA: Diagnosis not present

## 2015-03-17 DIAGNOSIS — M1990 Unspecified osteoarthritis, unspecified site: Secondary | ICD-10-CM | POA: Insufficient documentation

## 2015-03-17 DIAGNOSIS — E119 Type 2 diabetes mellitus without complications: Secondary | ICD-10-CM | POA: Insufficient documentation

## 2015-03-17 DIAGNOSIS — Y998 Other external cause status: Secondary | ICD-10-CM | POA: Insufficient documentation

## 2015-03-17 DIAGNOSIS — Z853 Personal history of malignant neoplasm of breast: Secondary | ICD-10-CM | POA: Insufficient documentation

## 2015-03-17 DIAGNOSIS — G4730 Sleep apnea, unspecified: Secondary | ICD-10-CM | POA: Insufficient documentation

## 2015-03-17 DIAGNOSIS — Z79899 Other long term (current) drug therapy: Secondary | ICD-10-CM | POA: Diagnosis not present

## 2015-03-17 DIAGNOSIS — R51 Headache: Secondary | ICD-10-CM

## 2015-03-17 DIAGNOSIS — W01198A Fall on same level from slipping, tripping and stumbling with subsequent striking against other object, initial encounter: Secondary | ICD-10-CM | POA: Insufficient documentation

## 2015-03-17 DIAGNOSIS — S0990XA Unspecified injury of head, initial encounter: Secondary | ICD-10-CM | POA: Insufficient documentation

## 2015-03-17 DIAGNOSIS — Y9389 Activity, other specified: Secondary | ICD-10-CM | POA: Diagnosis not present

## 2015-03-17 DIAGNOSIS — S8992XA Unspecified injury of left lower leg, initial encounter: Secondary | ICD-10-CM | POA: Diagnosis not present

## 2015-03-17 DIAGNOSIS — M79605 Pain in left leg: Secondary | ICD-10-CM

## 2015-03-17 DIAGNOSIS — I10 Essential (primary) hypertension: Secondary | ICD-10-CM | POA: Insufficient documentation

## 2015-03-17 DIAGNOSIS — R519 Headache, unspecified: Secondary | ICD-10-CM

## 2015-03-17 DIAGNOSIS — E785 Hyperlipidemia, unspecified: Secondary | ICD-10-CM | POA: Diagnosis not present

## 2015-03-17 NOTE — Discharge Instructions (Signed)
Please use ibuprofen, Tylenol, ice as needed for pain. Please monitor for new or worsening signs or symptoms, follow-up immediately if any present. If symptoms persist beyond 1 week please follow-up your primary care provider for reevaluation.

## 2015-03-17 NOTE — ED Notes (Signed)
Reports a fall on Sunday where she slipped and fell. Reports head pain from hitting her head, left leg pain and buttock pain. No loc

## 2015-03-17 NOTE — ED Provider Notes (Signed)
CSN: [id number]     Arrival date & time 03/17/15  1237 History  By signing my name below, I, Soijett Blue, attest that this documentation has been prepared under the direction and in the presence of Lenn Sink, PA-C Electronically Signed: Soijett Blue, ED Scribe. 03/17/2015. 2:44 PM.   Chief Complaint  Patient presents with  . Fall  . Headache  . Leg Pain      The history is provided by the patient. No language interpreter was used.    Erin House is a 64 y.o. female with a medical hx of DM, HTN, who presents to the Emergency Department complaining of a fall onset 2 days ago. Pt notes that she slipped and fell while walking on a hardwood floor with stockings on. She notes that she fell sideways, her head hit the door frame, and she hit the mirror and felt to the floor. She notes that when she hit her head, she heard a "ding" but she didn't pass out. Pt is having associated symptoms of pain to left side of head, HA, buttock pain, posterior/lateral left leg pain with ambulation.  She reports that she has to limp now with ambulation after the fall with her pain being worse on the exterior side and back of the left knee. She notes that she has not tried any medications but she has used ice for the relief of her symptoms. She denies LOC, arm pain, memory loss, gait problem, n/v, and any other symptoms. Denies being on blood thinners at this time. She states that she walks on a normal basis.    Past Medical History  Diagnosis Date  . Diabetes mellitus without complication ([place])   . Hyperlipemia   . Hypertension   . Arthritis     knees  . Sleep apnea     has CPAP but does not wear every night  . Cancer [facility]) 2000    left breast   Past Surgical History  Procedure Laterality Date  . Mastectomy Left 05/1999  . Cholecystectomy    . Wisdom tooth extraction    . Rotator cuff repair      LEFT SHOULDER  . Cyst on wrist      LEFT WRIST-BENIGN  . Breast biopsy Right 04/29/2014  . Breast  lumpectomy with radioactive seed localization Right 06/11/2014    Procedure: BREAST LUMPECTOMY WITH RADIOACTIVE SEED LOCALIZATION;  Surgeon: Jackolyn Confer, MD;  Location: [facility];  Service: General;  Laterality: Right;   History reviewed. No pertinent family history. Social History  Substance Use Topics  . Smoking status: Former Smoker    Quit date: 05/24/1993  . Smokeless tobacco: Never Used  . Alcohol Use: 0.0 oz/week    0 Standard drinks or equivalent per week     Comment: occasionlly   OB History    No data available     Review of Systems  HENT:       Bump to left sided head  Gastrointestinal: Negative for nausea and vomiting.  Musculoskeletal: Positive for arthralgias (pain to buttock area and left leg ). Negative for gait problem.  Skin: Negative for color change and wound.  Neurological: Positive for headaches. Negative for syncope.      Allergies  Review of patient's allergies indicates no known allergies.  Home Medications   Prior to Admission medications   Medication Sig Start Date End Date Taking? Authorizing Provider  celecoxib (CELEBREX) 100 MG capsule Take 1 capsule (100 mg total) by mouth 2 (  two) times daily. 01/30/15   Dorena Dew, FNP  Chlorphen-PE-Acetaminophen 4-10-325 MG TABS Take 1 tablet by mouth every 6 (six) hours as needed. 02/27/15   Dorena Dew, FNP  gabapentin (NEURONTIN) 300 MG capsule 300 mg PO Daily x 1 day, then 300 mg PO BID x 1 day, then 300 mg PO TID. Patient not taking: Reported on 10/22/2014 07/16/14   Leana Gamer, MD  lisinopril-hydrochlorothiazide (ZESTORETIC) 20-12.5 MG per tablet Take 1 tablet by mouth daily. 07/16/14   Leana Gamer, MD  naproxen (NAPROSYN) 500 MG tablet Take 1 tablet (500 mg total) by mouth 2 (two) times daily as needed. 07/16/14   Leana Gamer, MD  oxyCODONE (OXY IR/ROXICODONE) 5 MG immediate release tablet Take 1-2 tablets (5-10 mg total) by mouth every 6 (six) hours as  needed for moderate pain, severe pain or breakthrough pain. Patient not taking: Reported on 10/22/2014 06/11/14   Jackolyn Confer, MD  rosuvastatin (CRESTOR) 10 MG tablet Take 1 tablet (10 mg total) by mouth daily. Patient not taking: Reported on 01/30/2015 10/22/14   Dorena Dew, FNP  silver sulfADIAZINE (SILVADENE) 1 % cream Apply topically daily. Patient not taking: Reported on 10/22/2014 08/24/14   Gregor Hams, MD  sitaGLIPtin-metformin (JANUMET) 50-1000 MG per tablet Take 1 tablet by mouth 2 (two) times daily with a meal. 10/23/14   Dorena Dew, FNP  Vitamin D, Ergocalciferol, (DRISDOL) 50000 UNITS CAPS capsule Take 1 capsule (50,000 Units total) by mouth every 7 (seven) days. 10/22/14   Dorena Dew, FNP   BP 148/75 mmHg  Pulse 64  Temp(Src) 97.9 F (36.6 C) (Oral)  Resp 20  SpO2 98%   Physical Exam  Constitutional: She is oriented to person, place, and time. She appears well-developed and well-nourished. No distress.  HENT:  Head: Normocephalic and atraumatic.  Head atraumatic. No sign of soft tissue, deformity or infection. No point TTP.   Eyes: EOM are normal.  Neck: Normal range of motion. Neck supple.  Full active ROM of neck and shoulders.  Cardiovascular: Normal rate.   Pulmonary/Chest: Effort normal. No respiratory distress.  Musculoskeletal: Normal range of motion.       Right shoulder: Normal.       Left shoulder: Normal.       Right elbow: Normal.      Left elbow: Normal.       Right wrist: Normal.       Left wrist: Normal.       Left hip: She exhibits normal range of motion and no tenderness.       Right upper arm: Normal.       Left upper arm: Normal.       Right forearm: Normal.       Left forearm: Normal.       Right hand: Normal.       Left hand: Normal.       Left upper leg: She exhibits tenderness.  Remainder of upper extremities are atraumatic. Lower extremities show tenderness to lateral soft tissue muscle of left thigh. No sign of trauma. Full  ROM . Ambulates without difficulty. No TTP of the left hip.   Neurological: She is alert and oriented to person, place, and time.  Skin: Skin is warm and dry.  Psychiatric: She has a normal mood and affect. Her behavior is normal.  Nursing note and vitals reviewed.   ED Course  Procedures (including critical care time) DIAGNOSTIC STUDIES: Oxygen Saturation is 99%  on RA, nl by my interpretation.    COORDINATION OF CARE: 2:38 PM Discussed treatment plan with pt at bedside which includes use tylenol/ibuprofen PRN, ice/heat/massage, and f/u with PCP in 1 week if the pain persist and pt agreed to plan.    Labs Review Labs Reviewed - No data to display  Imaging Review No results found.    EKG Interpretation None      MDM   Final diagnoses:  Headache, unspecified headache type  Pain of left lower extremity    Labs:   Imaging:   Consults:   Therapeutics:   Discharge Meds:   Assessment/Plan: Present presentation likely muscular pain. She has no signs of trauma on exam, no indication for CT of head based on the [place] head CT rule.  Use tylenol/ibuprofen PRN for pain, ice for the 1st 24 hours and heat and massage following, and f/u with PCP in 1 week if the pain persist. Patient verbalized understanding and agreement with today's plan and had no further questions or concerns at the time of discharge.  I personally performed the services described in this documentation, which was scribed in my presence. The recorded information has been reviewed and is accurate.    Okey Regal, PA-C 03/17/15 [place], MD 03/18/15 [phone]

## 2015-03-17 NOTE — Telephone Encounter (Signed)
Patient C/O fall on staurday.  Patient C/O pain to leg behind knee, and difficulty walking after sitting for long periods. After speaking with [place], NP, advised patient that she could go the ED for evaluation if she felt it was urgent, or she could be worked in for an office appointment one day this week.  Patient then stated she hit her head on the door and has had a H/A.  I explained to the patient that she would need to go to the ED to have this evaluated since she hit her head, however, patient declined.  Patient verbalized understanding.  Patient states she is going to continue her regimen at home and if need be she will call back tomorrow for an appointment.

## 2015-03-17 NOTE — ED Notes (Signed)
Declined W/C at D/C and was escorted to lobby by RN. 

## 2015-03-23 ENCOUNTER — Other Ambulatory Visit: Payer: Self-pay | Admitting: Family Medicine

## 2015-03-31 ENCOUNTER — Ambulatory Visit (HOSPITAL_COMMUNITY)
Admission: RE | Admit: 2015-03-31 | Discharge: 2015-03-31 | Disposition: A | Discharge status: Home / Self Care | Payer: Medicare Other | Source: Ambulatory Visit | Attending: Family Medicine | Admitting: Family Medicine

## 2015-03-31 ENCOUNTER — Ambulatory Visit (INDEPENDENT_AMBULATORY_CARE_PROVIDER_SITE_OTHER): Payer: Medicare Other | Admitting: Family Medicine

## 2015-03-31 ENCOUNTER — Encounter: Payer: Self-pay | Admitting: Family Medicine

## 2015-03-31 VITALS — BP 130/67 | HR 92 | Temp 97.9°F | Resp 16 | Ht 60.25 in | Wt 248.0 lb

## 2015-03-31 DIAGNOSIS — M79652 Pain in left thigh: Secondary | ICD-10-CM | POA: Diagnosis not present

## 2015-03-31 DIAGNOSIS — IMO0001 Reserved for inherently not codable concepts without codable children: Secondary | ICD-10-CM

## 2015-03-31 DIAGNOSIS — W19XXXD Unspecified fall, subsequent encounter: Secondary | ICD-10-CM | POA: Diagnosis not present

## 2015-03-31 DIAGNOSIS — S8992XD Unspecified injury of left lower leg, subsequent encounter: Secondary | ICD-10-CM | POA: Diagnosis not present

## 2015-03-31 DIAGNOSIS — W108XXS Fall (on) (from) other stairs and steps, sequela: Secondary | ICD-10-CM | POA: Diagnosis not present

## 2015-03-31 DIAGNOSIS — M25562 Pain in left knee: Secondary | ICD-10-CM

## 2015-03-31 DIAGNOSIS — E1165 Type 2 diabetes mellitus with hyperglycemia: Secondary | ICD-10-CM

## 2015-03-31 DIAGNOSIS — I10 Essential (primary) hypertension: Secondary | ICD-10-CM

## 2015-03-31 MED ORDER — TRAMADOL HCL 50 MG PO TABS: 50.0000 mg | ORAL_TABLET | Freq: Three times a day (TID) | ORAL | Status: DC | PRN

## 2015-03-31 MED ORDER — KETOROLAC TROMETHAMINE 30 MG/ML IJ SOLN
30.0000 mg | Freq: Once | INTRAMUSCULAR | Status: AC
  Administered 2015-03-31: 30 mg via INTRAMUSCULAR

## 2015-03-31 NOTE — Patient Instructions (Addendum)
Start Tramadol 50 mg ever 8 hours as needed for moderate to severe pain. Refrain from drinking, driving, or operating machinery while taking Tramadol Will follow up with Ms. Azad by phone on 04/01/2015 to discuss xray results.  Knee Pain Knee pain is a very common symptom and can have many causes. Knee pain often goes away when you follow your health care provider's instructions for relieving pain and discomfort at home. However, knee pain can develop into a condition that needs treatment. Some conditions may include:  Arthritis caused by wear and tear (osteoarthritis).  Arthritis caused by swelling and irritation (rheumatoid arthritis or gout).  A cyst or growth in your knee.  An infection in your knee joint.  An injury that will not heal.  Damage, swelling, or irritation of the tissues that support your knee (torn ligaments or tendinitis). If your knee pain continues, additional tests may be ordered to diagnose your condition. Tests may include X-rays or other imaging studies of your knee. You may also need to have fluid removed from your knee. Treatment for ongoing knee pain depends on the cause, but treatment may include:  Medicines to relieve pain or swelling.  Steroid injections in your knee.  Physical therapy.  Surgery. HOME CARE INSTRUCTIONS  Take medicines only as directed by your health care provider.  Rest your knee and keep it raised (elevated) while you are resting.  Do not do things that cause or worsen pain.  Avoid high-impact activities or exercises, such as running, jumping rope, or doing jumping jacks.  Apply ice to the knee area:  Put ice in a plastic bag.  Place a towel between your skin and the bag.  Leave the ice on for 20 minutes, 2-3 times a day.  Ask your health care provider if you should wear an elastic knee support.  Keep a pillow under your knee when you sleep.  Lose weight if you are overweight. Extra weight can put pressure on your  knee.  Do not use any tobacco products, including cigarettes, chewing tobacco, or electronic cigarettes. If you need help quitting, ask your health care provider. Smoking may slow the healing of any bone and joint problems that you may have. SEEK MEDICAL CARE IF:  Your knee pain continues, changes, or gets worse.  You have a fever along with knee pain.  Your knee buckles or locks up.  Your knee becomes more swollen. SEEK IMMEDIATE MEDICAL CARE IF:   Your knee joint feels hot to the touch.  You have chest pain or trouble breathing.   This information is not intended to replace advice given to you by your health care provider. Make sure you discuss any questions you have with your health care provider.   Document Released: 03/07/2007 Document Revised: 05/31/2014 Document Reviewed: 12/24/2013 Elsevier Interactive Patient Education [employer].

## 2015-03-31 NOTE — Progress Notes (Signed)
Subjective:    Patient ID: Erin House, female    DOB: 01-03-1951, 64 y.o.   MRN: [id number] [name], a 64 year old female with a history of type 2 diabetes and hypertension presents for left lower extremity pain. She maintains that she sustained a fall at home while descending the base of stairs on hardwood floor without shoes on 03/15/2015. She reports that she fell on left side. She reports that she had injury to head and left lower extremity. She states that she was evaluated in the emergency department. She maintains that her head was evaluated primarily. She states that left lower extremity has been in constant pain since fall. Her current pain intensity is 8/10, unrelieved by Naprosyn 500 mg last taken last night around 9 pm. She states that left lowe extremity is aggravated by descending stairs, bearing weight, transitioning from sitting to standing, and increased activity.  Leg Pain  The incident occurred more than 1 week ago. The incident occurred at home. The pain is present in the left hip, left leg and left knee. The pain is at a severity of 8/10. The pain is moderate. The pain has been constant since onset. Pertinent negatives include no inability to bear weight, loss of motion, loss of sensation, muscle weakness, numbness or tingling. She reports no foreign bodies present. The symptoms are aggravated by movement and weight bearing. She has tried elevation, ice, immobilization, heat and NSAIDs for the symptoms. The treatment provided mild relief.    Past Medical History  Diagnosis Date  . Diabetes mellitus without complication ([place])   . Hyperlipemia   . Hypertension   . Arthritis     knees  . Sleep apnea     has CPAP but does not wear every night  . 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 Influenza,inj,Quad PF,36+ Mos 02/18/2014, 01/30/2015  . Pneumococcal Polysaccharide-23 04/11/2014  . Tdap 07/15/2014   . Zoster 04/11/2014   Social History   Social History  . Marital Status: Married    Spouse Name: N/A  . Number of Children: N/A  . Years of Education: N/A   Occupational History  . Not on file.   Social History Main Topics  . Smoking status: Former Smoker    Quit date: 05/24/1993  . Smokeless tobacco: Never Used  . Alcohol Use: 0.0 oz/week    0 Standard drinks or equivalent per week     Comment: occasionlly  . Drug Use: No  . Sexual Activity: Not on file   Other Topics Concern  . Not on file   Social History Narrative   Review of Systems  Constitutional: Negative.  Negative for fever and fatigue.  HENT: Negative.   Eyes: Negative.   Respiratory: Positive for apnea.   Cardiovascular: Negative.   Gastrointestinal: Negative.   Endocrine: Negative.   Genitourinary: Negative.   Musculoskeletal: Positive for myalgias (Left leg pain) and arthralgias. Negative for back pain.  Skin: Negative.   Neurological: Negative for dizziness, tingling, light-headedness and numbness.  Hematological: Negative.   Psychiatric/Behavioral: Negative.        Objective:   Physical Exam  Constitutional: She is oriented to person, place, and time. She appears well-developed and well-nourished.  HENT:  Head: Normocephalic and atraumatic.  Right Ear: External ear normal.  Left Ear: External ear normal.  Mouth/Throat: Oropharynx is clear and moist.  Eyes: Conjunctivae and EOM are normal. Pupils are equal, round,  and reactive to light.  Neck: Normal range of motion. Neck supple.  Cardiovascular: Normal rate, regular rhythm, normal heart sounds and intact distal pulses.   Pulmonary/Chest: Effort normal and breath sounds normal.  Abdominal: Soft. Bowel sounds are normal.  Musculoskeletal:       Left knee: She exhibits decreased range of motion, swelling and abnormal patellar mobility. She exhibits no ecchymosis, no erythema and normal alignment. Tenderness found.       Left upper leg: She  exhibits tenderness and swelling. She exhibits no bony tenderness.       Left lower leg: She exhibits tenderness.  Neurological: She is alert and oriented to person, place, and time. She has normal reflexes.  Skin: Skin is warm and dry.  Psychiatric: She has a normal mood and affect. Her behavior is normal. Judgment and thought content normal.        BP 130/67 mmHg  Pulse 92  Temp(Src) 97.9 F (36.6 C) (Oral)  Resp 16  Ht 5' 0.25" (1.53 m)  Wt 248 lb (112.492 kg)  BMI 48.06 kg/m2  Assessment & Plan:  1. Left knee injury, subsequent encounter Patient sustained injury on 03/15/2015 after a fall. Patient is currently walking with a 'limp" and is having difficulty bearing weight. Pain is primarily to lateral aspect of knee. I suspect that patient has a soft tissue injury. I will send for plain films to rule out a fracture.  - DG Knee Complete 4 Views Left; Future - Sedimentation Rate  2. Fall (on) (from) other stairs and steps, sequela Refer to #1 3. Left knee pain - ketorolac (TORADOL) 30 MG/ML injection 30 mg; Inject 1 mL (30 mg total) into the muscle once.  4. Acute pain of left thigh Recommend Tramadol 50 mg for moderate to severe pain.   - traMADol (ULTRAM) 50 MG tablet; Take 1 tablet (50 mg total) by mouth every 8 (eight) hours as needed.  Dispense: 30 tablet; Refill: 0 - ketorolac (TORADOL) 30 MG/ML injection 30 mg; Inject 1 mL (30 mg total) into the muscle once.  5. Essential hypertension Blood pressure is at goal on current medication regimen  6. Uncontrolled type 2 diabetes mellitus without complication, without long-term current use of insulin ([place]) Continue current medication regimen. Recommend a lowfat, low carbohydrate diet divided over 5-6 small meals and increase water intake to 6-8 glasses.      RTC: Will follow up with patient by phone on 04/01/2015 to discuss xray results, otherwise follow up as previously scheduled for DMII and hypertension.   Erin Dew, FNP

## 2015-04-01 LAB — SEDIMENTATION RATE: Sed Rate: 8 mm/hr (ref 0–30)

## 2015-04-02 ENCOUNTER — Other Ambulatory Visit: Payer: Self-pay

## 2015-04-02 ENCOUNTER — Telehealth: Payer: Self-pay

## 2015-04-02 DIAGNOSIS — M1990 Unspecified osteoarthritis, unspecified site: Secondary | ICD-10-CM

## 2015-04-02 MED ORDER — CELECOXIB 100 MG PO CAPS: 100.0000 mg | ORAL_CAPSULE | Freq: Two times a day (BID) | ORAL | Status: DC

## 2015-04-02 NOTE — Telephone Encounter (Signed)
Called, no answer. Left message for patient to call back regarding results. Thanks!

## 2015-04-02 NOTE — Telephone Encounter (Signed)
Refill for celebrex sent into pharmacy. Thanks!

## 2015-04-02 NOTE — Telephone Encounter (Signed)
Patient returned call, I advised of x ray findings and to re-start celebrex and discontinue naproxen. Patient verbalized understanding. Thanks!

## 2015-04-02 NOTE — Telephone Encounter (Signed)
-----   Message from Dorena Dew, [place] sent at 04/01/2015  4:00 PM EST -----  Please inform Erin House that her leg xray did not have any acute findings. She has mild degenerative changes that are consistent with arthritis. She also has some soft tissue swelling that will take 4-5 weeks to discipate.  We will restart Celibrex for arthritis and discontinue naproxen. If pain continues she may warrant a referral to the orthopedic doctor. Please follow up in office as scheduled. Also, inform her that we have the forms for the cpap.    ----- Message -----    From: Rad Results In Interface    Sent: 04/01/2015   8:10 AM      To: Dorena Dew, FNP

## 2015-04-24 ENCOUNTER — Emergency Department (HOSPITAL_COMMUNITY): Payer: Medicare Other

## 2015-04-24 ENCOUNTER — Emergency Department (HOSPITAL_COMMUNITY)
Admission: EM | Admit: 2015-04-24 | Discharge: 2015-04-24 | Disposition: A | Discharge status: Home / Self Care | Payer: Medicare Other | Attending: Emergency Medicine | Admitting: Emergency Medicine

## 2015-04-24 ENCOUNTER — Encounter (HOSPITAL_COMMUNITY): Payer: Self-pay | Admitting: Emergency Medicine

## 2015-04-24 DIAGNOSIS — E119 Type 2 diabetes mellitus without complications: Secondary | ICD-10-CM | POA: Insufficient documentation

## 2015-04-24 DIAGNOSIS — Z87891 Personal history of nicotine dependence: Secondary | ICD-10-CM | POA: Diagnosis not present

## 2015-04-24 DIAGNOSIS — Z79899 Other long term (current) drug therapy: Secondary | ICD-10-CM | POA: Insufficient documentation

## 2015-04-24 DIAGNOSIS — Z791 Long term (current) use of non-steroidal anti-inflammatories (NSAID): Secondary | ICD-10-CM | POA: Insufficient documentation

## 2015-04-24 DIAGNOSIS — Z853 Personal history of malignant neoplasm of breast: Secondary | ICD-10-CM | POA: Diagnosis not present

## 2015-04-24 DIAGNOSIS — R079 Chest pain, unspecified: Secondary | ICD-10-CM | POA: Diagnosis present

## 2015-04-24 DIAGNOSIS — E785 Hyperlipidemia, unspecified: Secondary | ICD-10-CM | POA: Insufficient documentation

## 2015-04-24 DIAGNOSIS — M1990 Unspecified osteoarthritis, unspecified site: Secondary | ICD-10-CM | POA: Diagnosis not present

## 2015-04-24 DIAGNOSIS — R0789 Other chest pain: Secondary | ICD-10-CM | POA: Diagnosis not present

## 2015-04-24 DIAGNOSIS — I10 Essential (primary) hypertension: Secondary | ICD-10-CM | POA: Diagnosis not present

## 2015-04-24 DIAGNOSIS — Z7984 Long term (current) use of oral hypoglycemic drugs: Secondary | ICD-10-CM | POA: Diagnosis not present

## 2015-04-24 LAB — BASIC METABOLIC PANEL
Anion gap: 9 (ref 5–15)
BUN: 17 mg/dL (ref 6–20)
CO2: 26 mmol/L (ref 22–32)
Calcium: 9.8 mg/dL (ref 8.9–10.3)
Chloride: 103 mmol/L (ref 101–111)
Creatinine, Ser: 0.64 mg/dL (ref 0.44–1.00)
GFR calc Af Amer: >60 mL/min (ref >60)
GFR calc non Af Amer: >60 mL/min (ref >60)
Glucose, Bld: 160 mg/dL — ABNORMAL HIGH (ref 65–99)
Potassium: 3.9 mmol/L (ref 3.5–5.1)
Sodium: 138 mmol/L (ref 135–145)

## 2015-04-24 LAB — CBC
HCT: 37.8 % (ref 36.0–46.0)
Hemoglobin: 11.6 g/dL — ABNORMAL LOW (ref 12.0–15.0)
MCH: 23.9 pg — ABNORMAL LOW (ref 26.0–34.0)
MCHC: 30.7 g/dL (ref 30.0–36.0)
MCV: 77.8 fL — ABNORMAL LOW (ref 78.0–100.0)
Platelets: 158 10*3/uL (ref 150–400)
RBC: 4.86 MIL/uL (ref 3.87–5.11)
RDW: 16.1 % — ABNORMAL HIGH (ref 11.5–15.5)
WBC: 8.4 10*3/uL (ref 4.0–10.5)

## 2015-04-24 LAB — I-STAT TROPONIN, ED: TROPONIN I, POC: 0 ng/mL (ref 0.00–0.08)

## 2015-04-24 NOTE — ED Notes (Signed)
Pt left with all belongings and ambulated out of the treatment area.  

## 2015-04-24 NOTE — ED Provider Notes (Signed)
CSN: [id number]     Arrival date & time 04/24/15  1332 History   First MD Initiated Contact with Erin House 04/24/15 1912     Chief Complaint  Erin House presents with  . Chest Pain     (Consider location/radiation/quality/duration/timing/severity/associated sxs/prior Treatment) HPI Complains of anterior chest pain nonradiating pleuritic in quality onset 4:30 AM today pain is nonexertional worse with lying in right lateral decubitus position improved with remaining still no shortness of breath no nausea no sweatiness no other associated symptoms. Discomfort is minimal at present, does not have exertional component has been constant since 4:30 AM no treatment prior to coming here Past Medical History  Diagnosis Date  . Diabetes mellitus without complication ([place])   . Hyperlipemia   . Hypertension   . Arthritis     knees  . Sleep apnea     has CPAP but does not wear every night  . 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 Mastectomy Left 05/1999  . Cholecystectomy    . Wisdom tooth extraction    . Rotator cuff repair      LEFT SHOULDER  . Cyst on wrist      LEFT WRIST-BENIGN  . Breast biopsy Right 04/29/2014  . Breast lumpectomy with radioactive seed localization Right 06/11/2014    Procedure: BREAST LUMPECTOMY WITH RADIOACTIVE SEED LOCALIZATION;  Surgeon: Jackolyn Confer, MD;  Location: [place];  Service: General;  Laterality: Right;   No family history on file. Social History  Substance Use Topics  . Smoking status: Former Smoker    Quit date: 05/24/1993  . Smokeless tobacco: Never Used  . Alcohol Use: 0.0 oz/week    0 Standard drinks or equivalent per week     Comment: occasionlly   OB History    No data available     Review of Systems  Constitutional: Negative.   HENT: Negative.   Respiratory: Negative.   Cardiovascular: Positive for chest pain.  Gastrointestinal: Negative.   Musculoskeletal: Negative.    Skin: Negative.   Allergic/Immunologic: Positive for immunocompromised state.       Diabetic  Neurological: Negative.   Psychiatric/Behavioral: Negative.   All other systems reviewed and are negative.     Allergies  Review of Erin House's allergies indicates no known allergies.  Home Medications   Prior to Admission medications   Medication Sig Start Date End Date Taking? Authorizing Provider  celecoxib (CELEBREX) 100 MG capsule Take 1 capsule (100 mg total) by mouth 2 (two) times daily. 04/02/15   Dorena Dew, FNP  Chlorphen-PE-Acetaminophen 4-10-325 MG TABS Take 1 tablet by mouth every 6 (six) hours as needed. 02/27/15   Dorena Dew, FNP  gabapentin (NEURONTIN) 300 MG capsule 300 mg PO Daily x 1 day, then 300 mg PO BID x 1 day, then 300 mg PO TID. 07/16/14   Leana Gamer, MD  lisinopril-hydrochlorothiazide (ZESTORETIC) 20-12.5 MG per tablet Take 1 tablet by mouth daily. 07/16/14   Leana Gamer, MD  naproxen (NAPROSYN) 500 MG tablet Take 1 tablet (500 mg total) by mouth 2 (two) times daily as needed. 07/16/14   Leana Gamer, MD  rosuvastatin (CRESTOR) 10 MG tablet Take 1 tablet (10 mg total) by mouth daily. Erin House not taking: Reported on 01/30/2015 10/22/14   Dorena Dew, FNP  silver sulfADIAZINE (SILVADENE) 1 % cream Apply topically daily. Erin House not taking: Reported on 10/22/2014 08/24/14   Gregor Hams,  MD  sitaGLIPtin-metformin (JANUMET) 50-1000 MG per tablet Take 1 tablet by mouth 2 (two) times daily with a meal. 10/23/14   Dorena Dew, FNP  traMADol (ULTRAM) 50 MG tablet Take 1 tablet (50 mg total) by mouth every 8 (eight) hours as needed. 03/31/15   Dorena Dew, FNP  Vitamin D, Ergocalciferol, (DRISDOL) 50000 UNITS CAPS capsule TAKE ONE CAPSULE BY MOUTH EVERY 7 DAYS 03/24/15   Dorena Dew, FNP   BP 161/91 mmHg  Pulse 76  Temp(Src) 97.8 F (36.6 C) (Oral)  Resp 18  SpO2 97% Physical Exam  Constitutional: She appears well-developed and  well-nourished.  HENT:  Head: Normocephalic and atraumatic.  Eyes: Conjunctivae are normal. Pupils are equal, round, and reactive to light.  Neck: Neck supple. No tracheal deviation present. No thyromegaly present.  Cardiovascular: Normal rate and regular rhythm.   No murmur heard. Pulmonary/Chest: Effort normal and breath sounds normal.  Abdominal: Soft. Bowel sounds are normal. She exhibits no distension. There is no tenderness.  Obese  Musculoskeletal: Normal range of motion. She exhibits no edema or tenderness.  Neurological: She is alert. Coordination normal.  Skin: Skin is warm and dry. No rash noted.  Psychiatric: She has a normal mood and affect.  Nursing note and vitals reviewed.   ED Course  Procedures (including critical care time) Labs Review Labs Reviewed  BASIC METABOLIC PANEL - Abnormal; Notable for the following:    Glucose, Bld 160 (*)    All other components within normal limits  CBC - Abnormal; Notable for the following:    Hemoglobin 11.6 (*)    MCV 77.8 (*)    MCH 23.9 (*)    RDW 16.1 (*)    All other components within normal limits  I-STAT TROPOININ, ED    Imaging Review Dg Chest 2 View  04/24/2015  CLINICAL DATA:  Shortness of breath and chest pain EXAM: CHEST  2 VIEW COMPARISON:  None. FINDINGS: Lungs are clear. Heart is upper normal in size with pulmonary vascularity within normal limits. No adenopathy. There is degenerative change in the thoracic spine. The Erin House is status post left mastectomy with surgical clips in left axillary region. IMPRESSION: No edema or consolidation.  Status post left mastectomy. Electronically Signed   By: Lowella Grip III M.D.   On: 04/24/2015 14:33   I have personally reviewed and evaluated these images and lab results as part of my medical decision-making.   EKG Interpretation   Date/Time:  Thursday April 24 2015 13:38:54 EST Ventricular Rate:  75 PR Interval:  194 QRS Duration: 94 QT Interval:  384 QTC  Calculation: 428 R Axis:   -21 Text Interpretation:  Normal sinus rhythm Left ventricular hypertrophy  Abnormal ECG No significant change since last tracing Confirmed by  Winfred Leeds  MD, Tresha Muzio [phone]) on 04/24/2015 7:13:40 PM     Chest x-ray viewed by me Results for orders placed or performed during the hospital encounter of [id number]  Basic metabolic panel  Result Value Ref Range   Sodium 138 135 - 145 mmol/L   Potassium 3.9 3.5 - 5.1 mmol/L   Chloride 103 101 - 111 mmol/L   CO2 26 22 - 32 mmol/L   Glucose, Bld 160 (H) 65 - 99 mg/dL   BUN 17 6 - 20 mg/dL   Creatinine, Ser 0.64 0.44 - 1.00 mg/dL   Calcium 9.8 8.9 - 10.3 mg/dL   GFR calc non Af Amer >60 >60 mL/min   GFR calc Af Amer >60 >  60 mL/min   Anion gap 9 5 - 15  CBC  Result Value Ref Range   WBC 8.4 4.0 - 10.5 K/uL   RBC 4.86 3.87 - 5.11 MIL/uL   Hemoglobin 11.6 (L) 12.0 - 15.0 g/dL   HCT 37.8 36.0 - 46.0 %   MCV 77.8 (L) 78.0 - 100.0 fL   MCH 23.9 (L) 26.0 - 34.0 pg   MCHC 30.7 30.0 - 36.0 g/dL   RDW 16.1 (H) 11.5 - 15.5 %   Platelets 158 150 - 400 K/uL  I-stat troponin, ED (not at [facility])  Result Value Ref Range   Troponin i, poc 0.00 0.00 - 0.08 ng/mL   Comment 3           Dg Chest 2 View  04/24/2015  CLINICAL DATA:  Shortness of breath and chest pain EXAM: CHEST  2 VIEW COMPARISON:  None. FINDINGS: Lungs are clear. Heart is upper normal in size with pulmonary vascularity within normal limits. No adenopathy. There is degenerative change in the thoracic spine. The Erin House is status post left mastectomy with surgical clips in left axillary region. IMPRESSION: No edema or consolidation.  Status post left mastectomy. Electronically Signed   By: Lowella Grip III M.D.   On: 04/24/2015 14:33   Dg Knee Complete 4 Views Left  04/01/2015  CLINICAL DATA:  Fall in the last 2 weeks. Left knee injury. Pain at the lateral and posterior aspects of the distal femur and proximal tibia/fibula. Initial encounter. EXAM: LEFT KNEE -  COMPLETE 4+ VIEW COMPARISON:  None. FINDINGS: No acute fracture or dislocation is seen. No sizable knee joint effusion is identified. There is mild-to-moderate degenerative patellar spurring. Mild medial compartment spurring is also noted with at most minimal joint space narrowing. No lytic or blastic osseous lesion is identified. There is a 1 mm punctate radiodensity within the superficial soft tissues posterior to the knee, nonspecific. IMPRESSION: No acute osseous abnormality identified. Electronically Signed   By: Logan Bores M.D.   On: 04/01/2015 08:07    MDM  Strongly doubt coronary syndrome. Negative troponin after greater than 12 hours of symptoms, normal EKG, pleuritic quality. Doubt pulmonary embolism. No shortness of breath. Symptoms gradual onset. Final diagnoses:  None   heart score equals 4. I feel the Erin House can be discharged home Pericarditis remote possibility however no EKG evidence no murmurs or rubs PLANAdvil as directed for pain. Keep scheduled appointment with her primary care physician [facility] 1 week from today dX #1 ATYPICAL CHEST PAIN #2 hypegylcemia     Orlie Dakin, MD 04/24/15 1931

## 2015-04-24 NOTE — ED Notes (Signed)
Pt reports non-radiating sub-sterna CP which started prior to travel today at 430. Pt denies cardiac hx. Pt alert x4. NAD at this time.

## 2015-04-24 NOTE — Discharge Instructions (Signed)
Nonspecific Chest Pain  Take Advil as directed for pain. Keep your scheduled appointment at the [facility] 1 week from today. Return if you feel worse or our concerned for any reason Chest pain can be caused by many different conditions. There is always a chance that your pain could be related to something serious, such as a heart attack or a blood clot in your lungs. Chest pain can also be caused by conditions that are not life-threatening. If you have chest pain, it is very important to follow up with your health care provider. CAUSES  Chest pain can be caused by:  Heartburn.  Pneumonia or bronchitis.  Anxiety or stress.  Inflammation around your heart (pericarditis) or lung (pleuritis or pleurisy).  A blood clot in your lung.  A collapsed lung (pneumothorax). It can develop suddenly on its own (spontaneous pneumothorax) or from trauma to the chest.  Shingles infection (varicella-zoster virus).  Heart attack.  Damage to the bones, muscles, and cartilage that make up your chest wall. This can include:  Bruised bones due to injury.  Strained muscles or cartilage due to frequent or repeated coughing or overwork.  Fracture to one or more ribs.  Sore cartilage due to inflammation (costochondritis). RISK FACTORS  Risk factors for chest pain may include:  Activities that increase your risk for trauma or injury to your chest.  Respiratory infections or conditions that cause frequent coughing.  Medical conditions or overeating that can cause heartburn.  Heart disease or family history of heart disease.  Conditions or health behaviors that increase your risk of developing a blood clot.  Having had chicken pox (varicella zoster). SIGNS AND SYMPTOMS Chest pain can feel like:  Burning or tingling on the surface of your chest or deep in your chest.  Crushing, pressure, aching, or squeezing pain.  Dull or sharp pain that is worse when you move, cough, or take a deep  breath.  Pain that is also felt in your back, neck, shoulder, or arm, or pain that spreads to any of these areas. Your chest pain may come and go, or it may stay constant. DIAGNOSIS Lab tests or other studies may be needed to find the cause of your pain. Your health care provider may have you take a test called an ambulatory ECG (electrocardiogram). An ECG records your heartbeat patterns at the time the test is performed. You may also have other tests, such as:  Transthoracic echocardiogram (TTE). During echocardiography, sound waves are used to create a picture of all of the heart structures and to look at how blood flows through your heart.  Transesophageal echocardiogram (TEE).This is a more advanced imaging test that obtains images from inside your body. It allows your health care provider to see your heart in finer detail.  Cardiac monitoring. This allows your health care provider to monitor your heart rate and rhythm in real time.  Holter monitor. This is a portable device that records your heartbeat and can help to diagnose abnormal heartbeats. It allows your health care provider to track your heart activity for several days, if needed.  Stress tests. These can be done through exercise or by taking medicine that makes your heart beat more quickly.  Blood tests.  Imaging tests. TREATMENT  Your treatment depends on what is causing your chest pain. Treatment may include:  Medicines. These may include:  Acid blockers for heartburn.  Anti-inflammatory medicine.  Pain medicine for inflammatory conditions.  Antibiotic medicine, if an infection is present.  Medicines to dissolve blood clots.  Medicines to treat coronary artery disease.  Supportive care for conditions that do not require medicines. This may include:  Resting.  Applying heat or cold packs to injured areas.  Limiting activities until pain decreases. HOME CARE INSTRUCTIONS  If you were prescribed an  antibiotic medicine, finish it all even if you start to feel better.  Avoid any activities that bring on chest pain.  Do not use any tobacco products, including cigarettes, chewing tobacco, or electronic cigarettes. If you need help quitting, ask your health care provider.  Do not drink alcohol.  Take medicines only as directed by your health care provider.  Keep all follow-up visits as directed by your health care provider. This is important. This includes any further testing if your chest pain does not go away.  If heartburn is the cause for your chest pain, you may be told to keep your head raised (elevated) while sleeping. This reduces the chance that acid will go from your stomach into your esophagus.  Make lifestyle changes as directed by your health care provider. These may include:  Getting regular exercise. Ask your health care provider to suggest some activities that are safe for you.  Eating a heart-healthy diet. A registered dietitian can help you to learn healthy eating options.  Maintaining a healthy weight.  Managing diabetes, if necessary.  Reducing stress. SEEK MEDICAL CARE IF:  Your chest pain does not go away after treatment.  You have a rash with blisters on your chest.  You have a fever. SEEK IMMEDIATE MEDICAL CARE IF:   Your chest pain is worse.  You have an increasing cough, or you cough up blood.  You have severe abdominal pain.  You have severe weakness.  You faint.  You have chills.  You have sudden, unexplained chest discomfort.  You have sudden, unexplained discomfort in your arms, back, neck, or jaw.  You have shortness of breath at any time.  You suddenly start to sweat, or your skin gets clammy.  You feel nauseous or you vomit.  You suddenly feel light-headed or dizzy.  Your heart begins to beat quickly, or it feels like it is skipping beats. These symptoms may represent a serious problem that is an emergency. Do not wait to  see if the symptoms will go away. Get medical help right away. Call your local emergency services (911 in the [place] Do not drive yourself to the hospital.   This information is not intended to replace advice given to you by your health care provider. Make sure you discuss any questions you have with your health care provider.   Document Released: 02/17/2005 Document Revised: 05/31/2014 Document Reviewed: 12/14/2013 Elsevier Interactive Patient Education [employer].

## 2015-05-01 ENCOUNTER — Encounter: Payer: Self-pay | Admitting: Family Medicine

## 2015-05-01 ENCOUNTER — Ambulatory Visit (INDEPENDENT_AMBULATORY_CARE_PROVIDER_SITE_OTHER): Payer: Medicare Other | Admitting: Family Medicine

## 2015-05-01 VITALS — BP 142/60 | HR 59 | Temp 97.8°F | Resp 16 | Ht 60.25 in | Wt 250.0 lb

## 2015-05-01 DIAGNOSIS — E1165 Type 2 diabetes mellitus with hyperglycemia: Secondary | ICD-10-CM

## 2015-05-01 DIAGNOSIS — I10 Essential (primary) hypertension: Secondary | ICD-10-CM

## 2015-05-01 DIAGNOSIS — D649 Anemia, unspecified: Secondary | ICD-10-CM | POA: Diagnosis not present

## 2015-05-01 DIAGNOSIS — IMO0001 Reserved for inherently not codable concepts without codable children: Secondary | ICD-10-CM

## 2015-05-01 DIAGNOSIS — M25562 Pain in left knee: Secondary | ICD-10-CM | POA: Diagnosis not present

## 2015-05-01 LAB — COMPLETE METABOLIC PANEL WITH GFR
ALT: 37 U/L — AB (ref 6–29)
AST: 23 U/L (ref 10–35)
Albumin: 3.7 g/dL (ref 3.6–5.1)
Alkaline Phosphatase: 69 U/L (ref 33–130)
BUN: 19 mg/dL (ref 7–25)
CHLORIDE: 102 mmol/L (ref 98–110)
CO2: 31 mmol/L (ref 20–31)
CREATININE: 0.61 mg/dL (ref 0.50–0.99)
Calcium: 9.4 mg/dL (ref 8.6–10.4)
GFR, Est Non African American: >89 mL/min (ref >=60)
Glucose, Bld: 138 mg/dL — ABNORMAL HIGH (ref 65–99)
Potassium: 4.6 mmol/L (ref 3.5–5.3)
Sodium: 141 mmol/L (ref 135–146)
Total Bilirubin: 0.6 mg/dL (ref 0.2–1.2)
Total Protein: 6.7 g/dL (ref 6.1–8.1)

## 2015-05-01 LAB — POCT URINALYSIS DIP (DEVICE)
Bilirubin Urine: NEGATIVE
Glucose, UA: NEGATIVE mg/dL
HGB URINE DIPSTICK: NEGATIVE
Ketones, ur: NEGATIVE mg/dL
LEUKOCYTES UA: NEGATIVE
Nitrite: NEGATIVE
Protein, ur: NEGATIVE mg/dL
SPECIFIC GRAVITY, URINE: 1.025 (ref 1.005–1.030)
UROBILINOGEN UA: 0.2 mg/dL (ref 0.0–1.0)
pH: 6.5 (ref 5.0–8.0)

## 2015-05-01 LAB — CBC WITH DIFFERENTIAL/PLATELET
BASOS PCT: 0 % (ref 0–1)
Basophils Absolute: 0 10*3/uL (ref 0.0–0.1)
EOS ABS: 0.2 10*3/uL (ref 0.0–0.7)
EOS PCT: 3 % (ref 0–5)
HCT: 37 % (ref 36.0–46.0)
Hemoglobin: 11.3 g/dL — ABNORMAL LOW (ref 12.0–15.0)
Lymphocytes Relative: 37 % (ref 12–46)
Lymphs Abs: 2 10*3/uL (ref 0.7–4.0)
MCH: 23.3 pg — ABNORMAL LOW (ref 26.0–34.0)
MCHC: 30.5 g/dL (ref 30.0–36.0)
MCV: 76.4 fL — AB (ref 78.0–100.0)
MPV: 10.2 fL (ref 8.6–12.4)
Monocytes Absolute: 0.3 10*3/uL (ref 0.1–1.0)
Monocytes Relative: 6 % (ref 3–12)
Neutro Abs: 2.9 10*3/uL (ref 1.7–7.7)
Neutrophils Relative %: 54 % (ref 43–77)
PLATELETS: 192 10*3/uL (ref 150–400)
RBC: 4.84 MIL/uL (ref 3.87–5.11)
RDW: 16.8 % — AB (ref 11.5–15.5)
WBC: 5.4 10*3/uL (ref 4.0–10.5)

## 2015-05-01 LAB — GLUCOSE, CAPILLARY: Glucose-Capillary: 155 mg/dL — ABNORMAL HIGH (ref 65–99)

## 2015-05-01 MED ORDER — IBUPROFEN 600 MG PO TABS: 600.0000 mg | ORAL_TABLET | Freq: Three times a day (TID) | ORAL | Status: DC | PRN

## 2015-05-01 MED ORDER — KETOROLAC TROMETHAMINE 60 MG/2ML IM SOLN
60.0000 mg | Freq: Once | INTRAMUSCULAR | Status: AC
  Administered 2015-05-01: 60 mg via INTRAMUSCULAR

## 2015-05-01 NOTE — Progress Notes (Signed)
Subjective:    Patient ID: [name], female    DOB: 05/15/1951, 64 y.o.   MRN: [id number]  HPI Pertinent negatives for diabetes include no fatigue, no polydipsia, no polyphagia and no polyuria.   Ms. Bahn, a 64 year old, presents for a 3 month follow up of type II diabetes mellitus and hypertension. She states that she has been checking her blood sugars daily and average daily glucose has ranged between 125-140 since last visit. She states that she has decreased exercise regimen due to increased travel. Ms. Fouty denies headache, weakness, fatigue, polydipsia, polyuria, polyphagia, nausea, vomiting, or diarrhea.   Ms. Tinner is also follow up for hypertension. Her blood pressure has improved since adding medications and modifying diet. She is exercising and is adherent to low salt diet. Blood pressure is well controlled at home. Patient denies chest pain, dyspnea, fatigue, orthopnea, palpitations, syncope and tachypnea. Cardiovascular risk factors: diabetes mellitus, dyslipidemia and obesity (BMI >= 30 kg/m2).   Past Medical History  Diagnosis Date  . Diabetes mellitus without complication ([place])   . Hyperlipemia   . Hypertension   . Arthritis     knees  . Sleep apnea     has CPAP but does not wear every night  . 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 Marital Status: Married    Spouse Name: N/A  . Number of Children: N/A  . Years of Education: N/A   Occupational History  . Not on file.   Social History Main Topics  . Smoking status: Former Smoker    Quit date: 05/24/1993  . Smokeless tobacco: Never Used  . Alcohol Use: 0.0 oz/week    0 Standard drinks or equivalent per week     Comment: occasionlly  . Drug Use: No  . Sexual Activity: Not on file   Other Topics Concern  . Not on file   Social History Narrative  No Known Allergies  Immunization History  Administered Date(s) Administered  . Influenza,inj,Quad PF,36+  Mos 02/18/2014, 01/30/2015  . Pneumococcal Polysaccharide-23 04/11/2014  . Tdap 07/15/2014  . Zoster 04/11/2014   Review of Systems  Constitutional: Negative.   HENT: Negative.   Eyes: Negative.   Respiratory: Negative.   Cardiovascular: Negative.   Gastrointestinal: Negative.   Endocrine: Negative.   Genitourinary: Negative.  Negative for dysuria and frequency.  Musculoskeletal: Positive for arthralgias (primarily to left shoulder, lower back, and left knee).  Skin: Negative.   Allergic/Immunologic: Negative.   Neurological: Negative.  Negative for light-headedness.  Hematological: Negative.   Psychiatric/Behavioral: Positive for sleep disturbance.       Objective:   Physical Exam  Constitutional: She is oriented to person, place, and time. She appears well-developed and well-nourished.  HENT:  Head: Normocephalic and atraumatic.  Right Ear: Hearing, tympanic membrane, external ear and ear canal normal. No swelling or tenderness. No decreased hearing is noted.  Left Ear: Hearing, tympanic membrane, external ear and ear canal normal. No swelling or tenderness.  Eyes: Conjunctivae and EOM are normal. Pupils are equal, round, and reactive to light. Lids are everted and swept, no foreign bodies found.  Neck: Normal range of motion. Neck supple.  Cardiovascular: Normal rate, regular rhythm, normal heart sounds and intact distal pulses.   Pulmonary/Chest: Effort normal and breath sounds normal.  Abdominal: Soft. Normal appearance and bowel sounds are normal.  Musculoskeletal:       Left shoulder: She exhibits decreased range  of motion, tenderness and pain.       Left knee: She exhibits decreased range of motion and swelling. Tenderness found.  Neurological: She is alert and oriented to person, place, and time. She has normal reflexes.  Skin: Skin is warm and dry.  Psychiatric: She has a normal mood and affect. Her behavior is normal. Judgment and thought content normal.      BP  142/60 mmHg  Pulse 59  Temp(Src) 97.8 F (36.6 C) (Oral)  Resp 16  Ht 5' 0.25" (1.53 m)  Wt 250 lb (113.399 kg)  BMI 48.44 kg/m2 Assessment & Plan:   1. Uncontrolled type 2 diabetes mellitus without complication, without long-term current use of insulin ([place]) Reviewed previous hemoglobin a1C, 7.7, will repeat today. Also, patient has gained 10 pounds since October, discussed diet at length and given written information. Recommend a lowfat, low carbohydrate diet divided over 5-6 small meals, increase water intake to 6-8 glasses, and 150 minutes per week of cardiovascular exercise (water aerobics).    - Glucose (CBG) - POCT urinalysis dipstick - COMPLETE METABOLIC PANEL WITH GFR - EKG 12-Lead - Hemoglobin A1c  2. Essential hypertension Blood pressure is at goal on current medication regimen. The patient is asked to make an attempt to improve diet and exercise patterns to aid in medical management of this problem. - POCT urinalysis dipstick  3. Anemia, unspecified anemia type  - CBC with Differential  4. Left knee pain Recommend that she warrants an orthopedic evaluation of left knee. She states that she does not want to go to a specialist at this point. She has been given interventions.  - ketorolac (TORADOL) injection 60 mg; Inject 2 mLs (60 mg total) into the muscle once. - ibuprofen (ADVIL,MOTRIN) 600 MG tablet; Take 1 tablet (600 mg total) by mouth every 8 (eight) hours as needed.  Dispense: 30 tablet; Refill: 0   RTC: 3 months for DMII and hypertension Sharmila Wrobleski M, FNP

## 2015-05-01 NOTE — Patient Instructions (Signed)
Basic Carbohydrate Counting for Diabetes Mellitus Carbohydrate counting is a method for keeping track of the amount of carbohydrates you eat. Eating carbohydrates naturally increases the level of sugar (glucose) in your blood, so it is important for you to know the amount that is okay for you to have in every meal. Carbohydrate counting helps keep the level of glucose in your blood within normal limits. The amount of carbohydrates allowed is different for every person. A dietitian can help you calculate the amount that is right for you. Once you know the amount of carbohydrates you can have, you can count the carbohydrates in the foods you want to eat. Carbohydrates are found in the following foods:  Grains, such as breads and cereals.  Dried beans and soy products.  Starchy vegetables, such as potatoes, peas, and corn.  Fruit and fruit juices.  Milk and yogurt.  Sweets and snack foods, such as cake, cookies, candy, chips, soft drinks, and fruit drinks. CARBOHYDRATE COUNTING There are two ways to count the carbohydrates in your food. You can use either of the methods or a combination of both. Reading the "Nutrition Facts" on Packaged Food The "Nutrition Facts" is an area that is included on the labels of almost all packaged food and beverages in the [place]. It includes the serving size of that food or beverage and information about the nutrients in each serving of the food, including the grams (g) of carbohydrate per serving.  Decide the number of servings of this food or beverage that you will be able to eat or drink. Multiply that number of servings by the number of grams of carbohydrate that is listed on the label for that serving. The total will be the amount of carbohydrates you will be having when you eat or drink this food or beverage. Learning Standard Serving Sizes of Food When you eat food that is not packaged or does not include "Nutrition Facts" on the label, you need to  measure the servings in order to count the amount of carbohydrates.A serving of most carbohydrate-rich foods contains about 15 g of carbohydrates. The following list includes serving sizes of carbohydrate-rich foods that provide 15 g ofcarbohydrate per serving:   1 slice of bread (1 oz) or 1 six-inch tortilla.    of a hamburger bun or English muffin.  4-6 crackers.   cup unsweetened dry cereal.    cup hot cereal.   cup rice or pasta.    cup mashed potatoes or  of a large baked potato.  1 cup fresh fruit or one small piece of fruit.    cup canned or frozen fruit or fruit juice.  1 cup milk.   cup plain fat-free yogurt or yogurt sweetened with artificial sweeteners.   cup cooked dried beans or starchy vegetable, such as peas, corn, or potatoes.  Decide the number of standard-size servings that you will eat. Multiply that number of servings by 15 (the grams of carbohydrates in that serving). For example, if you eat 2 cups of strawberries, you will have eaten 2 servings and 30 g of carbohydrates (2 servings x 15 g = 30 g). For foods such as soups and casseroles, in which more than one food is mixed in, you will need to count the carbohydrates in each food that is included. EXAMPLE OF CARBOHYDRATE COUNTING Sample Dinner  3 oz chicken breast.   cup of brown rice.   cup of corn.  1 cup milk.   1 cup strawberries with   sugar-free whipped topping.  Carbohydrate Calculation Step 1: Identify the foods that contain carbohydrates:   Rice.   Corn.   Milk.   Strawberries. Step 2:Calculate the number of servings eaten of each:   2 servings of rice.   1 serving of corn.   1 serving of milk.   1 serving of strawberries. Step 3: Multiply each of those number of servings by 15 g:   2 servings of rice x 15 g = 30 g.   1 serving of corn x 15 g = 15 g.   1 serving of milk x 15 g = 15 g.   1 serving of strawberries x 15 g = 15 g. Step 4: Add  together all of the amounts to find the total grams of carbohydrates eaten: 30 g + 15 g + 15 g + 15 g = 75 g.   This information is not intended to replace advice given to you by your health care provider. Make sure you discuss any questions you have with your health care provider.   Document Released: 05/10/2005 Document Revised: 05/31/2014 Document Reviewed: 04/06/2013 Elsevier Interactive Patient Education 2016 [place]. Diabetes and Foot Care Diabetes may cause you to have problems because of poor blood supply (circulation) to your feet and legs. This may cause the skin on your feet to become thinner, break easier, and heal more slowly. Your skin may become dry, and the skin may peel and crack. You may also have nerve damage in your legs and feet causing decreased feeling in them. You may not notice minor injuries to your feet that could lead to infections or more serious problems. Taking care of your feet is one of the most important things you can do for yourself.  HOME CARE INSTRUCTIONS  Wear shoes at all times, even in the house. Do not go barefoot. Bare feet are easily injured.  Check your feet daily for blisters, cuts, and redness. If you cannot see the bottom of your feet, use a mirror or ask someone for help.  Wash your feet with warm water (do not use hot water) and mild soap. Then pat your feet and the areas between your toes until they are completely dry. Do not soak your feet as this can dry your skin.  Apply a moisturizing lotion or petroleum jelly (that does not contain alcohol and is unscented) to the skin on your feet and to dry, brittle toenails. Do not apply lotion between your toes.  Trim your toenails straight across. Do not dig under them or around the cuticle. File the edges of your nails with an emery board or nail file.  Do not cut corns or calluses or try to remove them with medicine.  Wear clean socks or stockings every day. Make sure they are not too tight.  Do not wear knee-high stockings since they may decrease blood flow to your legs.  Wear shoes that fit properly and have enough cushioning. To break in new shoes, wear them for just a few hours a day. This prevents you from injuring your feet. Always look in your shoes before you put them on to be sure there are no objects inside.  Do not cross your legs. This may decrease the blood flow to your feet.  If you find a minor scrape, cut, or break in the skin on your feet, keep it and the skin around it clean and dry. These areas may be cleansed with mild soap and water. Do not  cleanse the area with peroxide, alcohol, or iodine.  When you remove an adhesive bandage, be sure not to damage the skin around it.  If you have a wound, look at it several times a day to make sure it is healing.  Do not use heating pads or hot water bottles. They may burn your skin. If you have lost feeling in your feet or legs, you may not know it is happening until it is too late.  Make sure your health care provider performs a complete foot exam at least annually or more often if you have foot problems. Report any cuts, sores, or bruises to your health care provider immediately. SEEK MEDICAL CARE IF:   You have an injury that is not healing.  You have cuts or breaks in the skin.  You have an ingrown nail.  You notice redness on your legs or feet.  You feel burning or tingling in your legs or feet.  You have pain or cramps in your legs and feet.  Your legs or feet are numb.  Your feet always feel cold. SEEK IMMEDIATE MEDICAL CARE IF:   There is increasing redness, swelling, or pain in or around a wound.  There is a red line that goes up your leg.  Pus is coming from a wound.  You develop a fever or as directed by your health care provider.  You notice a bad smell coming from an ulcer or wound.   This information is not intended to replace advice given to you by your health care provider. Make sure you  discuss any questions you have with your health care provider.   Document Released: 05/07/2000 Document Revised: 01/10/2013 Document Reviewed: 10/17/2012 Elsevier Interactive Patient Education 2016 [place] for Eating Away From Home If You Have Diabetes Controlling your level of blood glucose, also known as blood sugar, can be challenging. It can be even more difficult when you do not prepare your own meals. The following tips can help you manage your diabetes when you eat away from home. PLANNING AHEAD Plan ahead if you know you will be eating away from home:  Ask your health care provider how to time meals and medicine if you are taking insulin.  Make a list of restaurants near you that offer healthy choices. If they have a carry-out menu, take it home and plan what you will order ahead of time.  Look up the restaurant you want to eat at online. Many chain and fast-food restaurants list nutritional information online. Use this information to choose the healthiest options and to calculate how many carbohydrates will be in your meal.  Use a carbohydrate-counting book or mobile app to look up the carbohydrate content and serving size of the foods you want to eat.  Become familiar with serving sizes and learn to recognize how many servings are in a portion. This will allow you to estimate how many carbohydrates you can eat. FREE FOODS A "free food" is any food or drink that has less than 5 g of carbohydrates per serving. Free foods include:  Many vegetables.  Hard boiled eggs.  Nuts or seeds.  Olives.  Cheeses.  Meats. These types of foods make good appetizer choices and are often available at salad bars. Lemon juice, vinegar, or a low-calorie salad dressing of fewer than 20 calories per serving can be used as a "free" salad dressing.  CHOICES TO REDUCE CARBOHYDRATES  Substitute nonfat sweetened yogurt with a sugar-free yogurt. Yogurt made  from soy milk may also be used,  but you will still want a sugar-free or plain option to choose a lower carbohydrate amount.  Ask your server to take away the bread basket or chips from your table.  Order fresh fruit. A salad bar often offers fresh fruit choices. Avoid canned fruit because it is usually packed in sugar or syrup.  Order a salad, and eat it without dressing. Or, create a "free" salad dressing.  Ask for substitutions. For example, instead of [place] fries, request an order of a vegetable such as salad, green beans, or broccoli. OTHER TIPS   If you take insulin, take the insulin once your food arrives to your table. This will ensure your insulin and food are timed correctly.  Ask your server about the portion size before your order, and ask for a take-out box if the portion has more servings than you should have. When your food comes, leave the amount you should have on the plate, and put the rest in the take-out box.  Consider splitting an entree with someone and ordering a side salad.   This information is not intended to replace advice given to you by your health care provider. Make sure you discuss any questions you have with your health care provider.   Document Released: 05/10/2005 Document Revised: 01/29/2015 Document Reviewed: 08/07/2013 Elsevier Interactive Patient Education [employer].

## 2015-05-02 LAB — HEMOGLOBIN A1C
Hgb A1c MFr Bld: 7.8 % — ABNORMAL HIGH (ref <5.7)
MEAN PLASMA GLUCOSE: 177 mg/dL — AB (ref <117)

## 2015-05-05 ENCOUNTER — Telehealth: Payer: Self-pay

## 2015-05-05 NOTE — Telephone Encounter (Signed)
Patient  Called back, I advised of labs and to start low fat/ low carb diet. Appointment for fasting labs has been scheduled for 07/25/2015. Thanks!

## 2015-05-05 NOTE — Telephone Encounter (Signed)
-----   Message from Dorena Dew, [place] sent at 05/02/2015  7:51 AM EST ----- Regarding: lab results Please inform patient that her hemoglobin A1C has increased from 7.1 to 7.8. She is to start a low fat, low carbohydrate diet and exercise plan as discussed on 05/01/2015. We will not change medication regimen at this time. Will follow up in 3 months for a repeat hemoglobin A1c and fasting cholesterol panel.   Thanks ----- Message -----    From: [facility] [place] [facility]    Sent: 05/01/2015  11:18 AM      To: Dorena Dew, FNP

## 2015-05-05 NOTE — Telephone Encounter (Signed)
Called and left message asking patient to call back regarding lab work. Thanks!

## 2015-06-11 ENCOUNTER — Other Ambulatory Visit: Payer: Self-pay | Admitting: Family Medicine

## 2015-06-11 DIAGNOSIS — M25562 Pain in left knee: Secondary | ICD-10-CM

## 2015-06-11 MED ORDER — IBUPROFEN 600 MG PO TABS: 600.0000 mg | ORAL_TABLET | Freq: Three times a day (TID) | ORAL | Status: DC | PRN

## 2015-06-11 NOTE — Telephone Encounter (Signed)
Patient would like a referral for orthopaedist. Patient would like like to know if she can come in for an injection for pain in leg.

## 2015-06-11 NOTE — Telephone Encounter (Signed)
[place] patient is requesting a referral for orho, Can this be done? I have refilled ibuprofen as requested. Thanks!

## 2015-07-04 ENCOUNTER — Ambulatory Visit (INDEPENDENT_AMBULATORY_CARE_PROVIDER_SITE_OTHER): Payer: Medicare Other | Admitting: Family Medicine

## 2015-07-04 VITALS — BP 157/86 | HR 84 | Temp 97.8°F | Resp 16 | Ht 60.25 in | Wt 251.0 lb

## 2015-07-04 DIAGNOSIS — M25561 Pain in right knee: Secondary | ICD-10-CM | POA: Diagnosis not present

## 2015-07-04 MED ORDER — KETOROLAC TROMETHAMINE 30 MG/ML IJ SOLN
30.0000 mg | Freq: Once | INTRAMUSCULAR | Status: AC
  Administered 2015-07-04: 30 mg via INTRAMUSCULAR

## 2015-07-04 MED ORDER — NAPROXEN 500 MG PO TABS: 500.0000 mg | ORAL_TABLET | Freq: Two times a day (BID) | ORAL | Status: DC

## 2015-07-04 NOTE — Progress Notes (Signed)
Patient ID: [name], female   DOB: Mar 17, 1951, 65 y.o.   MRN: [id number]   Erin House, is a 65 y.o. female  [id number]  [id number]  DOB - 1951-04-13  CC:  Chief Complaint  Patient presents with  . Knee Pain    right knee pain x 1 week        HPI: Erin House is a 65 y.o. female here with a complaint of right knee pain. She has a history of chronic knee pain on the left. She is not aware of any injury to her right knee. The pain started 6 days ago. She describes the pain as dull to sharp and 7/10. It is in the medial aspect of the knee, without radiation. It is painful to flex the knee but more painful to fully straighten. It is disturbing her sleep at night. She request a Toredol injection. She has tried ibuprofen which is not helpful.  No Known Allergies Past Medical History  Diagnosis Date  . Diabetes mellitus without complication ([place])   . Hyperlipemia   . Hypertension   . Arthritis     knees  . Sleep apnea     has CPAP but does not wear every night  . 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 Chlorphen-PE-Acetaminophen 4-10-325 MG TABS Take 1 tablet by mouth every 6 (six) hours as needed. 20 tablet 0  . gabapentin (NEURONTIN) 300 MG capsule 300 mg PO Daily x 1 day, then 300 mg PO BID x 1 day, then 300 mg PO TID. 90 capsule 3  . ibuprofen (ADVIL,MOTRIN) 600 MG tablet Take 1 tablet (600 mg total) by mouth every 8 (eight) hours as needed. 30 tablet 0  . lisinopril-hydrochlorothiazide (ZESTORETIC) 20-12.5 MG per tablet Take 1 tablet by mouth daily. 30 tablet 3  . rosuvastatin (CRESTOR) 10 MG tablet Take 1 tablet (10 mg total) by mouth daily. 90 tablet 1  . sitaGLIPtin-metformin (JANUMET) 50-1000 MG per tablet Take 1 tablet by mouth 2 (two) times daily with a meal. 180 tablet 1  . Vitamin D, Ergocalciferol, (DRISDOL) 50000 UNITS CAPS capsule TAKE ONE CAPSULE BY MOUTH EVERY  7 DAYS 4 capsule 0  . silver sulfADIAZINE (SILVADENE) 1 % cream Apply topically daily. (Patient not taking: Reported on 10/22/2014) 50 g 1   No current facility-administered medications on file prior to visit.   No family history on file. Social History   Social History  . Marital Status: Married    Spouse Name: N/A  . Number of Children: N/A  . Years of Education: N/A   Occupational History  . Not on file.   Social History Main Topics  . Smoking status: Former Smoker    Quit date: 05/24/1993  . Smokeless tobacco: Never Used  . Alcohol Use: 0.0 oz/week    0 Standard drinks or equivalent per week     Comment: occasionlly  . Drug Use: No  . Sexual Activity: Not on file   Other Topics Concern  . Not on file   Social History Narrative    Review of Systems: Constitutional: Negative for fever, chills, appetite change, weight loss,  Fatigue. Skin: Negative for rashes or lesions of concern. HENT: Negative for ear pain, ear discharge.nose bleeds Eyes: Negative for pain, discharge, redness, itching and visual disturbance. Neck: Negative for pain, stiffness Respiratory: Negative for cough, shortness of breath,   Cardiovascular: Negative for chest  pain, palpitations and leg swelling. Gastrointestinal: Negative for abdominal pain, nausea, vomiting, diarrhea, constipations Genitourinary: Negative for dysuria, urgency, frequency, hematuria,  Musculoskeletal: Negative for back pain, joint  swelling, and gait problem.Negative for weakness. Positive for medial right knee pain Neurological: Negative for dizziness, tremors, seizures, syncope,   light-headedness, numbness and headaches.  Hematological: Negative for easy bruising or bleeding Psychiatric/Behavioral: Negative for depression, anxiety, decreased concentration, confusion   Objective:   Filed Vitals:   07/04/15 0822  BP: 157/86  Pulse: 84  Temp: 97.8 F (36.6 C)  Resp: 16    Physical Exam: Constitutional: Patient  appears well-developed and well-nourished. No distress. HENT: Normocephalic, atraumatic, External right and left ear normal. Oropharynx is clear and moist.  Eyes: Conjunctivae and EOM are normal. PERRLA, no scleral icterus. Neck: Normal ROM. Neck supple. No lymphadenopathy, No thyromegaly. CVS: RRR, S1/S2 +, no murmurs, no gallops, no rubs Pulmonary: Effort and breath sounds normal, no stridor, rhonchi, wheezes, rales.  Abdominal: Soft. Normoactive BS,, no distension, tenderness, rebound or guarding.  Musculoskeletal: There is slightly decreased ROM of the right knee, with pain on flexion and extension. She does have difficulty trying to get on to exam table. Neuro: Alert.Normal muscle tone coordination. Non-focal Skin: Skin is warm and dry. No rash noted. Not diaphoretic. No erythema. No pallor. Psychiatric: Normal mood and affect. Behavior, judgment, thought content normal.  Lab Results  Component Value Date   WBC 5.4 05/01/2015   HGB 11.3* 05/01/2015   HCT 37.0 05/01/2015   MCV 76.4* 05/01/2015   PLT 192 05/01/2015   Lab Results  Component Value Date   CREATININE 0.61 05/01/2015   BUN 19 05/01/2015   NA 141 05/01/2015   K 4.6 05/01/2015   CL 102 05/01/2015   CO2 31 05/01/2015    Lab Results  Component Value Date   HGBA1C 7.8* 05/01/2015   Lipid Panel     Component Value Date/Time   CHOL 169 01/15/2014 0907   TRIG 140 01/15/2014 0907   HDL 45 01/15/2014 0907   CHOLHDL 3.8 01/15/2014 0907   VLDL 28 01/15/2014 0907   LDLCALC 96 01/15/2014 0907       Assessment and plan:   1. Knee pain, acute, right  - ketorolac (TORADOL) 30 MG/ML injection 30 mg; Inject 1 mL (30 mg total) into the muscle once. - naproxen (NAPROSYN) 500 MG tablet; Take 1 tablet (500 mg total) by mouth 2 (two) times daily with a meal.  Dispense: 30 tablet; Refill: 1 -Use ice and/or heat several times a day. Sleep with small pillow between knees. Keep knee slightly flexed if sleeping on  back -Follow-up as needed and as planned   Return in about 6 months (around 01/01/2016), or if symptoms worsen or fail to improve.  The patient was given clear instructions to go to ER or return to medical center if symptoms don't improve, worsen or new problems develop. The patient verbalized understanding.    Micheline Chapman FNP  07/07/2015, 7:39 AM

## 2015-07-04 NOTE — Patient Instructions (Signed)
[facility] [phone]. Take naproxen twice a day with food for 2 weeks. Use ice and/or heat to knee several times a day.

## 2015-07-07 ENCOUNTER — Encounter: Payer: Self-pay | Admitting: Family Medicine

## 2015-07-08 ENCOUNTER — Telehealth: Payer: Self-pay | Admitting: Internal Medicine

## 2015-07-08 ENCOUNTER — Other Ambulatory Visit: Payer: Self-pay | Admitting: Family Medicine

## 2015-07-08 DIAGNOSIS — M25561 Pain in right knee: Principal | ICD-10-CM

## 2015-07-08 DIAGNOSIS — G8929 Other chronic pain: Secondary | ICD-10-CM

## 2015-07-08 NOTE — Telephone Encounter (Signed)
Patient states shot for pain did not work and would like a referral to an English as a second language teacher.

## 2015-07-08 NOTE — Telephone Encounter (Signed)
[place], Patient came in last week for pain in knee, she states it did not help. Can she be referred to orthopedic? Please advise. Thanks!

## 2015-07-08 NOTE — Telephone Encounter (Signed)
Please see message below.   Thanks

## 2015-07-15 ENCOUNTER — Encounter: Payer: Self-pay | Admitting: Sports Medicine

## 2015-07-15 ENCOUNTER — Ambulatory Visit (INDEPENDENT_AMBULATORY_CARE_PROVIDER_SITE_OTHER): Payer: Medicare Other | Admitting: Sports Medicine

## 2015-07-15 VITALS — BP 117/62 | HR 67 | Ht 61.0 in | Wt 248.0 lb

## 2015-07-15 DIAGNOSIS — M25561 Pain in right knee: Secondary | ICD-10-CM

## 2015-07-15 MED ORDER — METHYLPREDNISOLONE ACETATE 40 MG/ML IJ SUSP
40.0000 mg | Freq: Once | INTRAMUSCULAR | Status: AC
  Administered 2015-07-15: 40 mg via INTRA_ARTICULAR

## 2015-07-15 NOTE — Progress Notes (Signed)
  Erin House - 65 y.o. female MRN [id number]  Date of birth: Mar 15, 1951  SUBJECTIVE:  Including CC & ROS.  No chief complaint on file. CC: R knee pain  HPI: Patient presents today with R knee pain that has worsened over the past 3 weeks. She had a fall back in October where she injured her L knee. Since that time, her L knee has improved, but she often felt her self over-compensating with the R leg while walking. The past 3 weeks she has had a sharp pain that continually hurts along the medial aspect of the R knee and radiates down her medial shin. Pain is worse at night after she has been walking or standing on her feet throughout the day. Notes decreased ROM, particularly with flexion. Has tried heating pad, ibuprofen, and Tylenol without relief. Had toradol injection with PCP on 07/04/15 without relief. No history of trauma or previous surgery on the R knee. Denies popping, catching, locking or instability of the knee.   HISTORY: Past Medical, Surgical, Social, and Family History Reviewed & Updated per EMR.   Pertinent Historical Findings include: Is a diabetic and a non-smoker. No prior imaging of R knee. Son is an internal medicine physician in [place].  DATA REVIEWED: X-ray L knee from 03/31/15  PHYSICAL EXAM:  VS: BP:117/62 mmHg  HR:67bpm  TEMP: ( )  RESP:   HT:5\' 1"  (154.9 cm)   WT:248 lb (112.492 kg)  BMI:47 PHYSICAL EXAM: Gen: Well-appearing African American female in NAD HEENT: normocephalic, atraumatic, EOMI Pulm: non-labored respirations  R knee: No visible joint effusion, erythema, or bruising. Tender to palpation over the medial joint line and medial tibia. No TTP over the patella, lateral joint line, quad tendon, or patellar tendon.  60-70 degrees of flexion - limited by pain. Full extension. No crepitance. 5/5 strength with flexion and extension. No increased laxity with varus or valgus stress. Negative anterior and posterior drawer. Neurovascularly  intact.  ASSESSMENT & PLAN: See problem based charting & AVS for pt instructions. 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 No prior x-rays of R knee. No prior steroid injections. Discussed with patient risks and benefits of steroid injection including steroid flare and infection. Patient decided to proceed with injection today - notes below. - Steroid injection with medial approach  - If pain is persistent, patient will call and we will obtain x-rays to evaluate severity of OA or other causes. - Patient will follow up PRN if her pain is not improved.  Consent obtained and verified. Time-out conducted. Noted no overlying erythema, induration, or other signs of local infection. Skin prepped in a sterile fashion. Topical analgesic spray: Ethyl chloride. Joint: R knee Needle: 25 gauge 1.5 inch Completed without difficulty. Meds: 3 cc of 1% xylocaine, 1 cc depo-medrol (40 mg)  Advised to call if fevers/chills, erythema, induration, drainage, or persistent bleeding.

## 2015-07-25 ENCOUNTER — Other Ambulatory Visit: Payer: Self-pay | Admitting: Family Medicine

## 2015-07-25 ENCOUNTER — Other Ambulatory Visit: Payer: Medicare Other

## 2015-07-25 ENCOUNTER — Other Ambulatory Visit: Payer: Self-pay | Admitting: *Deleted

## 2015-07-25 DIAGNOSIS — E785 Hyperlipidemia, unspecified: Secondary | ICD-10-CM

## 2015-07-25 DIAGNOSIS — IMO0001 Reserved for inherently not codable concepts without codable children: Secondary | ICD-10-CM

## 2015-07-25 DIAGNOSIS — E1165 Type 2 diabetes mellitus with hyperglycemia: Secondary | ICD-10-CM

## 2015-07-25 DIAGNOSIS — I10 Essential (primary) hypertension: Secondary | ICD-10-CM

## 2015-07-25 LAB — COMPLETE METABOLIC PANEL WITH GFR
ALT: 25 U/L (ref 6–29)
AST: 18 U/L (ref 10–35)
Albumin: 4.1 g/dL (ref 3.6–5.1)
Alkaline Phosphatase: 66 U/L (ref 33–130)
BUN: 13 mg/dL (ref 7–25)
CHLORIDE: 99 mmol/L (ref 98–110)
CO2: 30 mmol/L (ref 20–31)
CREATININE: 0.62 mg/dL (ref 0.50–0.99)
Calcium: 9.8 mg/dL (ref 8.6–10.4)
GFR, Est Non African American: >89 mL/min (ref >=60)
GLUCOSE: 142 mg/dL — AB (ref 65–99)
Potassium: 4.4 mmol/L (ref 3.5–5.3)
SODIUM: 138 mmol/L (ref 135–146)
Total Bilirubin: 0.5 mg/dL (ref 0.2–1.2)
Total Protein: 6.8 g/dL (ref 6.1–8.1)

## 2015-07-28 ENCOUNTER — Encounter: Payer: Self-pay | Admitting: Sports Medicine

## 2015-07-28 ENCOUNTER — Ambulatory Visit
Admission: RE | Admit: 2015-07-28 | Discharge: 2015-07-28 | Disposition: A | Discharge status: Home / Self Care | Payer: Medicare Other | Source: Ambulatory Visit | Attending: Sports Medicine | Admitting: Sports Medicine

## 2015-07-28 ENCOUNTER — Ambulatory Visit (INDEPENDENT_AMBULATORY_CARE_PROVIDER_SITE_OTHER): Payer: Medicare Other | Admitting: Sports Medicine

## 2015-07-28 VITALS — BP 125/70

## 2015-07-28 DIAGNOSIS — M25561 Pain in right knee: Secondary | ICD-10-CM | POA: Diagnosis not present

## 2015-07-28 NOTE — Patient Instructions (Signed)
I'm going to get some x-rays of your right knee today  I will call you either later today or tomorrow with those results  I'm going to go ahead and schedule an MRI to be done, but I will cancel it if you have a lot of arthritis in your right knee. If that is the case, then I may want to try a second cortisone injection.  If you do proceed with the MRI, I will call you with those results once available and we will discuss further treatment.

## 2015-07-28 NOTE — Progress Notes (Signed)
   Subjective:    Patient ID: Erin House, female    DOB: 12-15-1950, 65 y.o.   MRN: [id number]  HPI   Patient comes in today for follow-up on right knee pain. She is still struggling with pain despite a recent cortisone injection. Pain is present with any sort of prolonged standing or walking. She gets pain with knee flexion as well. Pain with going up and down stairs. She denies any swelling. No stiffness. No instability. No locking or catching. She is walking with a limp.    Review of Systems    as above Objective:   Physical Exam   Obese. No acute distress.  Right knee: Range of motion from 0-120. Trace effusion. She is tender to palpation along the medial joint line with pain but no popping with McMurray's. No tenderness laterally. Good joint stability. Neurovascularly intact distally. Walking with a slight limp.     Assessment & Plan:   Persistent right knee pain secondary to DJD versus degenerative meniscal tear  I will start with x-rays of the right knee. If minimal degenerative changes are seen, then I'll proceed with an MRI specifically to rule out a medial meniscal tear. I will call her with the results of these studies once available and we will delineate more definitive treatment based on those findings.

## 2015-07-29 ENCOUNTER — Telehealth: Payer: Self-pay | Admitting: Sports Medicine

## 2015-07-29 NOTE — Telephone Encounter (Signed)
I spoke with the patient on the phone today after reviewing x-rays of her right knee. She has some mild medial compartmental degenerative changes but certainly nothing severe. I've recommended that she go ahead and proceed with the MRI as scheduled to rule out a degenerative meniscal tear. I will follow-up with her via telephone after I review that study and we will delineate more definitive treatment at that point in time.

## 2015-07-30 ENCOUNTER — Encounter: Payer: Self-pay | Admitting: Podiatry

## 2015-07-30 ENCOUNTER — Ambulatory Visit (INDEPENDENT_AMBULATORY_CARE_PROVIDER_SITE_OTHER): Payer: Medicare Other | Admitting: Podiatry

## 2015-07-30 VITALS — BP 139/70 | HR 69 | Resp 12

## 2015-07-30 DIAGNOSIS — E119 Type 2 diabetes mellitus without complications: Secondary | ICD-10-CM

## 2015-07-30 DIAGNOSIS — L609 Nail disorder, unspecified: Secondary | ICD-10-CM | POA: Diagnosis not present

## 2015-07-30 DIAGNOSIS — L608 Other nail disorders: Secondary | ICD-10-CM

## 2015-07-30 NOTE — Progress Notes (Deleted)
   Subjective:    Patient ID: Erin House, female    DOB: 1951/01/27, 65 y.o.   MRN: [id number]  HPI    Review of Systems  All other systems reviewed and are negative.      Objective:   Physical Exam        Assessment & Plan:

## 2015-07-30 NOTE — Patient Instructions (Signed)
Today your diabetic foot examination revealed adequate pulsation and feelings in your right and left feet. There was some mention of itching in the cuticles and I suggested she will apply Vaseline to the cuticle areas and right and left feet. Also, your request I did trim the incurvated toenails right and left feet Return as needed or yearly for diabetic foot examination  Diabetes and Foot Care Diabetes may cause you to have problems because of poor blood supply (circulation) to your feet and legs. This may cause the skin on your feet to become thinner, break easier, and heal more slowly. Your skin may become dry, and the skin may peel and crack. You may also have nerve damage in your legs and feet causing decreased feeling in them. You may not notice minor injuries to your feet that could lead to infections or more serious problems. Taking care of your feet is one of the most important things you can do for yourself.  HOME CARE INSTRUCTIONS  Wear shoes at all times, even in the house. Do not go barefoot. Bare feet are easily injured.  Check your feet daily for blisters, cuts, and redness. If you cannot see the bottom of your feet, use a mirror or ask someone for help.  Wash your feet with warm water (do not use hot water) and mild soap. Then pat your feet and the areas between your toes until they are completely dry. Do not soak your feet as this can dry your skin.  Apply a moisturizing lotion or petroleum jelly (that does not contain alcohol and is unscented) to the skin on your feet and to dry, brittle toenails. Do not apply lotion between your toes.  Trim your toenails straight across. Do not dig under them or around the cuticle. File the edges of your nails with an emery board or nail file.  Do not cut corns or calluses or try to remove them with medicine.  Wear clean socks or stockings every day. Make sure they are not too tight. Do not wear knee-high stockings since they may decrease blood  flow to your legs.  Wear shoes that fit properly and have enough cushioning. To break in new shoes, wear them for just a few hours a day. This prevents you from injuring your feet. Always look in your shoes before you put them on to be sure there are no objects inside.  Do not cross your legs. This may decrease the blood flow to your feet.  If you find a minor scrape, cut, or break in the skin on your feet, keep it and the skin around it clean and dry. These areas may be cleansed with mild soap and water. Do not cleanse the area with peroxide, alcohol, or iodine.  When you remove an adhesive bandage, be sure not to damage the skin around it.  If you have a wound, look at it several times a day to make sure it is healing.  Do not use heating pads or hot water bottles. They may burn your skin. If you have lost feeling in your feet or legs, you may not know it is happening until it is too late.  Make sure your health care provider performs a complete foot exam at least annually or more often if you have foot problems. Report any cuts, sores, or bruises to your health care provider immediately. SEEK MEDICAL CARE IF:   You have an injury that is not healing.  You have cuts or breaks  in the skin.  You have an ingrown nail.  You notice redness on your legs or feet.  You feel burning or tingling in your legs or feet.  You have pain or cramps in your legs and feet.  Your legs or feet are numb.  Your feet always feel cold. SEEK IMMEDIATE MEDICAL CARE IF:   There is increasing redness, swelling, or pain in or around a wound.  There is a red line that goes up your leg.  Pus is coming from a wound.  You develop a fever or as directed by your health care provider.  You notice a bad smell coming from an ulcer or wound.   This information is not intended to replace advice given to you by your health care provider. Make sure you discuss any questions you have with your health care  provider.   Document Released: 05/07/2000 Document Revised: 01/10/2013 Document Reviewed: 10/17/2012 Elsevier Interactive Patient Education [employer].

## 2015-07-31 ENCOUNTER — Encounter: Payer: Self-pay | Admitting: Family Medicine

## 2015-07-31 ENCOUNTER — Ambulatory Visit: Payer: Medicare Other | Admitting: Family Medicine

## 2015-07-31 ENCOUNTER — Telehealth: Payer: Self-pay | Admitting: *Deleted

## 2015-07-31 DIAGNOSIS — E785 Hyperlipidemia, unspecified: Secondary | ICD-10-CM

## 2015-07-31 MED ORDER — ROSUVASTATIN CALCIUM 10 MG PO TABS: 10.0000 mg | ORAL_TABLET | Freq: Every day | ORAL | Status: DC

## 2015-07-31 NOTE — Progress Notes (Signed)
Patient ID: Erin House, female   DOB: 22-Aug-1950, 65 y.o.   MRN: [id number]  Subjective: This patient presents today requesting yearly diabetic foot examination as well as trimming of her toenails. She denies any history of foot ulceration, claudication, or amputation. She mentions occasional itching around the cuticle areas over toenails She was last evaluated 07/31/2014   Review of systems All systems reviewed are negative    Orientated 3  Vascular: DP pulses 2/4 bilaterally PT pulses 2/4 bilaterally Capillary reflex immediate bilaterally  Neurological: Sensation to 10 g monofilament wire intact 5/5 bilaterally Vibratory sensation intact bilaterally Ankle reflex equal and reactive bilaterally  Dermatological: Texture and turgor within normal limits bilaterally The toenail 6-10 are normal trophic texture with mild incurvated nail margins 6-10  Musculoskeletal: Pes planus bilaterally Mallet toe second bilaterally This is no restriction ankle, subtalar, midtarsal joints bilaterally       Assessment & Plan:   Assessment: Diabetic without foot complications Satisfactory neurovascular status Mildly incurvated toenails that are asymptomatic   Plan: Discuss results of diabetic foot examination with patient today I debrided the toenails 6-10 asa non-dystrophic I told patient that she could go to pedicurist if she wanted to with these exceptions: No soaking No manipulation of the cuticle Debrided toenails and smooth only Apply Vaseline to the cuticle areas of toenails as needed  Reappoint yearly or at patient's request

## 2015-07-31 NOTE — Telephone Encounter (Signed)
Pt stated that she needs a refill on her medication, Crestor. Please advise provider

## 2015-07-31 NOTE — Telephone Encounter (Signed)
Refill for crestor sent into pharmacy. Thanks!

## 2015-08-05 ENCOUNTER — Ambulatory Visit
Admission: RE | Admit: 2015-08-05 | Discharge: 2015-08-05 | Disposition: A | Discharge status: Home / Self Care | Payer: Medicare Other | Source: Ambulatory Visit | Attending: Sports Medicine | Admitting: Sports Medicine

## 2015-08-05 DIAGNOSIS — M25561 Pain in right knee: Secondary | ICD-10-CM

## 2015-08-06 ENCOUNTER — Telehealth: Payer: Self-pay | Admitting: Sports Medicine

## 2015-08-06 ENCOUNTER — Other Ambulatory Visit: Payer: Self-pay | Admitting: *Deleted

## 2015-08-06 ENCOUNTER — Encounter: Payer: Self-pay | Admitting: *Deleted

## 2015-08-06 MED ORDER — ACETAMINOPHEN-CODEINE #3 300-30 MG PO TABS: ORAL_TABLET | ORAL | Status: DC

## 2015-08-06 NOTE — Patient Instructions (Signed)
[facility] Stefani Dama [address] [place]  [phone] Wednesday 08/13/15 at 315p

## 2015-08-06 NOTE — Telephone Encounter (Signed)
  I spoke with the patient on the phone today after reviewing the MRI of her right knee. Patient does have small tears of both the medial and lateral menisci. She also has moderately advanced osteoarthritis. She continues to have pain despite a recent cortisone injection. Therefore, I would like to refer her to Dr. Rhona Raider to discuss the possibility of arthroscopy versus Visco supplementation. In the meantime, I will prescribe her some Tylenol 3 to take as needed for pain. Follow-up with me prn.

## 2015-08-15 ENCOUNTER — Ambulatory Visit (INDEPENDENT_AMBULATORY_CARE_PROVIDER_SITE_OTHER): Payer: Medicare Other | Admitting: Family Medicine

## 2015-08-15 ENCOUNTER — Encounter: Payer: Self-pay | Admitting: Family Medicine

## 2015-08-15 VITALS — BP 141/70 | HR 72 | Temp 97.9°F | Wt 249.0 lb

## 2015-08-15 DIAGNOSIS — E1165 Type 2 diabetes mellitus with hyperglycemia: Secondary | ICD-10-CM

## 2015-08-15 DIAGNOSIS — I10 Essential (primary) hypertension: Secondary | ICD-10-CM | POA: Diagnosis not present

## 2015-08-15 DIAGNOSIS — IMO0001 Reserved for inherently not codable concepts without codable children: Secondary | ICD-10-CM

## 2015-08-15 DIAGNOSIS — Z23 Encounter for immunization: Secondary | ICD-10-CM

## 2015-08-15 LAB — HEMOGLOBIN A1C
Hgb A1c MFr Bld: 7.7 % — ABNORMAL HIGH (ref <5.7)
Mean Plasma Glucose: 174 mg/dL — ABNORMAL HIGH (ref <117)

## 2015-08-15 LAB — POCT URINALYSIS DIP (DEVICE)
BILIRUBIN URINE: NEGATIVE
Glucose, UA: NEGATIVE mg/dL
HGB URINE DIPSTICK: NEGATIVE
KETONES UR: NEGATIVE mg/dL
LEUKOCYTES UA: NEGATIVE
NITRITE: NEGATIVE
Protein, ur: NEGATIVE mg/dL
Urobilinogen, UA: 0.2 mg/dL (ref 0.0–1.0)
pH: 5.5 (ref 5.0–8.0)

## 2015-08-15 LAB — GLUCOSE, CAPILLARY: Glucose-Capillary: 164 mg/dL — ABNORMAL HIGH (ref 65–99)

## 2015-08-15 NOTE — Progress Notes (Signed)
Patient tolerated pneumonia injection well today.

## 2015-08-15 NOTE — Progress Notes (Signed)
Subjective:    Patient ID: Erin House, female    DOB: [birth year], 65 y.o.   MRN: [id number]  HPI Pertinent negatives for diabetes include no fatigue, no polydipsia, no polyphagia and no polyuria.   Erin House, a 65 year old, presents for a 3 month follow up of type II diabetes mellitus and hypertension. She states that she has been checking her blood sugars daily and  glucose has ranged between 125-160 since last visit. She states that she has not been exercising consistently due to right knee pain.  Erin House denies headache, weakness, fatigue, polydipsia, polyuria, polyphagia, nausea, vomiting, or diarrhea.   Erin House is also follow up for hypertension. She is exercising and is adherent to low salt diet. Blood pressure is well controlled at home. Patient denies chest pain, dyspnea, fatigue, orthopnea, palpitations, syncope and tachypnea. Cardiovascular risk factors: diabetes mellitus, dyslipidemia and obesity (BMI >= 30 kg/m2).   Past Medical History  Diagnosis Date  . Diabetes mellitus without complication ([place])   . Hyperlipemia   . Hypertension   . Arthritis     knees  . Sleep apnea     has CPAP but does not wear every night  . 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 Marital Status: Married    Spouse Name: N/A  . Number of Children: N/A  . Years of Education: N/A   Occupational History  . Not on file.   Social History Main Topics  . Smoking status: Former Smoker    Quit date: 05/24/1993  . Smokeless tobacco: Never Used  . Alcohol Use: 0.0 oz/week    0 Standard drinks or equivalent per week     Comment: occasionlly  . Drug Use: No  . Sexual Activity: Not on file   Other Topics Concern  . Not on file   Social History Narrative  No Known Allergies  Immunization History  Administered Date(s) Administered  . Influenza,inj,Quad PF,36+ Mos 02/18/2014, 01/30/2015  . Pneumococcal Polysaccharide-23 04/11/2014  . Tdap  07/15/2014  . Zoster 04/11/2014   Review of Systems  Constitutional: Negative.   HENT: Negative.   Eyes: Negative.  Negative for visual disturbance.  Respiratory: Negative.   Cardiovascular: Negative.   Gastrointestinal: Negative.   Endocrine: Negative.  Negative for cold intolerance, heat intolerance, polydipsia, polyphagia and polyuria.  Genitourinary: Negative.  Negative for dysuria and frequency.  Musculoskeletal: Negative.   Skin: Negative.   Allergic/Immunologic: Negative.   Neurological: Negative.  Negative for light-headedness.  Hematological: Negative.   Psychiatric/Behavioral: Negative.        Objective:   Physical Exam  Constitutional: She is oriented to person, place, and time. She appears well-developed and well-nourished.  HENT:  Head: Normocephalic and atraumatic.  Right Ear: Hearing, tympanic membrane, external ear and ear canal normal. No swelling or tenderness. No decreased hearing is noted.  Left Ear: Hearing, tympanic membrane, external ear and ear canal normal. No swelling or tenderness.  Nose: Nose normal.  Eyes: Conjunctivae and EOM are normal. Pupils are equal, round, and reactive to light. Lids are everted and swept, no foreign bodies found.  Neck: Normal range of motion. Neck supple.  Cardiovascular: Normal rate, regular rhythm, S1 normal, S2 normal, normal heart sounds and intact distal pulses.   Pulmonary/Chest: Effort normal and breath sounds normal.  Abdominal: Soft. Normal appearance and bowel sounds are normal. There is no tenderness.  Neurological: She is alert and oriented to person,  place, and time. She has normal reflexes.  Skin: Skin is warm and dry.  Psychiatric: She has a normal mood and affect. Her behavior is normal. Judgment and thought content normal. Cognition and memory are normal.      BP 141/70 mmHg  Pulse 72  Temp(Src) 97.9 F (36.6 C) (Oral)  SpO2 96% Assessment & Plan:   1. Uncontrolled type 2 diabetes mellitus without  complication, without long-term current use of insulin ([place]) Reviewed previous hemoglobin a1C, 7.8, will repeat today.  Recommend a lowfat, low carbohydrate diet divided over 5-6 small meals, increase water intake to 6-8 glasses, and 150 minutes per week of cardiovascular exercise (water aerobics).   - Hemoglobin A1c  2. Essential hypertension Blood pressure is at goal on current medication. Will continue medications. The patient is asked to make an attempt to improve diet and exercise patterns to aid in medical management of this problem.  3. Immunization due - Pneumococcal conjugate vaccine 13-valent   Routine Health Maintenance:  Scheduled patient appointment at [facility] on 09/01/2015 9:30 am Recommend scheduling mammogram Patient will need an annual wellness visit   RTC: 3 months for DMII and hypertension Paizleigh Wilds M, FNP

## 2015-08-18 ENCOUNTER — Other Ambulatory Visit: Payer: Self-pay

## 2015-08-18 ENCOUNTER — Other Ambulatory Visit: Payer: Self-pay | Admitting: Family Medicine

## 2015-08-18 ENCOUNTER — Telehealth: Payer: Self-pay

## 2015-08-18 DIAGNOSIS — I10 Essential (primary) hypertension: Secondary | ICD-10-CM

## 2015-08-18 DIAGNOSIS — Z1239 Encounter for other screening for malignant neoplasm of breast: Secondary | ICD-10-CM

## 2015-08-18 DIAGNOSIS — Z1231 Encounter for screening mammogram for malignant neoplasm of breast: Secondary | ICD-10-CM

## 2015-08-18 MED ORDER — VITAMIN D (ERGOCALCIFEROL) 1.25 MG (50000 UNIT) PO CAPS
ORAL_CAPSULE | ORAL | Status: DC
Start: 2015-08-18 — End: 2015-12-09

## 2015-08-18 MED ORDER — LISINOPRIL-HYDROCHLOROTHIAZIDE 20-12.5 MG PO TABS: 1.0000 | ORAL_TABLET | Freq: Every day | ORAL | Status: DC

## 2015-08-18 NOTE — Telephone Encounter (Signed)
Pt called requesting that the refills on her medications be changed to 90 day refills on the following: Lisinipril and vitamin D. She also wanted to know if she can get her eye doctor referral changed to a doctor where she can get her glasses and her eyes checked at the same place.

## 2015-08-26 ENCOUNTER — Ambulatory Visit
Admission: RE | Admit: 2015-08-26 | Discharge: 2015-08-26 | Disposition: A | Discharge status: Home / Self Care | Payer: Medicare Other | Source: Ambulatory Visit

## 2015-08-26 DIAGNOSIS — Z1231 Encounter for screening mammogram for malignant neoplasm of breast: Secondary | ICD-10-CM

## 2015-09-12 ENCOUNTER — Encounter: Payer: Self-pay | Admitting: Family Medicine

## 2015-09-12 ENCOUNTER — Ambulatory Visit (INDEPENDENT_AMBULATORY_CARE_PROVIDER_SITE_OTHER): Payer: Medicare Other | Admitting: Family Medicine

## 2015-09-12 VITALS — BP 145/78 | HR 59 | Temp 97.9°F | Ht 61.0 in | Wt 249.0 lb

## 2015-09-12 DIAGNOSIS — G4733 Obstructive sleep apnea (adult) (pediatric): Secondary | ICD-10-CM

## 2015-09-12 NOTE — Patient Instructions (Signed)
Sleep Apnea  Sleep apnea is a sleep disorder characterized by abnormal pauses in breathing while you sleep. When your breathing pauses, the level of oxygen in your blood decreases. This causes you to move out of deep sleep and into light sleep. As a result, your quality of sleep is poor, and the system that carries your blood throughout your body (cardiovascular system) experiences stress. If sleep apnea remains untreated, the following conditions can develop:  High blood pressure (hypertension).  Coronary artery disease.  Inability to achieve or maintain an erection (impotence).  Impairment of your thought process (cognitive dysfunction). There are three types of sleep apnea: 1. Obstructive sleep apnea--Pauses in breathing during sleep because of a blocked airway. 2. Central sleep apnea--Pauses in breathing during sleep because the area of the brain that controls your breathing does not send the correct signals to the muscles that control breathing. 3. Mixed sleep apnea--A combination of both obstructive and central sleep apnea. RISK FACTORS The following risk factors can increase your risk of developing sleep apnea:  Being overweight.  Smoking.  Having narrow passages in your nose and throat.  Being of older age.  Being female.  Alcohol use.  Sedative and tranquilizer use.  Ethnicity. Among individuals younger than 35 years, African Americans are at increased risk of sleep apnea. SYMPTOMS   Difficulty staying asleep.  Daytime sleepiness and fatigue.  Loss of energy.  Irritability.  Loud, heavy snoring.  Morning headaches.  Trouble concentrating.  Forgetfulness.  Decreased interest in sex.  Unexplained sleepiness. DIAGNOSIS  In order to diagnose sleep apnea, your caregiver will perform a physical examination. A sleep study done in the comfort of your own home may be appropriate if you are otherwise healthy. Your caregiver may also recommend that you spend the  night in a sleep lab. In the sleep lab, several monitors record information about your heart, lungs, and brain while you sleep. Your leg and arm movements and blood oxygen level are also recorded. TREATMENT The following actions may help to resolve mild sleep apnea:  Sleeping on your side.   Using a decongestant if you have nasal congestion.   Avoiding the use of depressants, including alcohol, sedatives, and narcotics.   Losing weight and modifying your diet if you are overweight. There also are devices and treatments to help open your airway:  Oral appliances. These are custom-made mouthpieces that shift your lower jaw forward and slightly open your bite. This opens your airway.  Devices that create positive airway pressure. This positive pressure "splints" your airway open to help you breathe better during sleep. The following devices create positive airway pressure:  Continuous positive airway pressure (CPAP) device. The CPAP device creates a continuous level of air pressure with an air pump. The air is delivered to your airway through a mask while you sleep. This continuous pressure keeps your airway open.  Nasal expiratory positive airway pressure (EPAP) device. The EPAP device creates positive air pressure as you exhale. The device consists of single-use valves, which are inserted into each nostril and held in place by adhesive. The valves create very little resistance when you inhale but create much more resistance when you exhale. That increased resistance creates the positive airway pressure. This positive pressure while you exhale keeps your airway open, making it easier to breath when you inhale again.  Bilevel positive airway pressure (BPAP) device. The BPAP device is used mainly in patients with central sleep apnea. This device is similar to the CPAP device because   it also uses an air pump to deliver continuous air pressure through a mask. However, with the BPAP machine, the  pressure is set at two different levels. The pressure when you exhale is lower than the pressure when you inhale.  Surgery. Typically, surgery is only done if you cannot comply with less invasive treatments or if the less invasive treatments do not improve your condition. Surgery involves removing excess tissue in your airway to create a wider passage way.   This information is not intended to replace advice given to you by your health care provider. Make sure you discuss any questions you have with your health care provider.   Document Released: 04/30/2002 Document Revised: 05/31/2014 Document Reviewed: 09/16/2011 Elsevier Interactive Patient Education 2016 Elsevier Inc.   

## 2015-09-12 NOTE — Progress Notes (Signed)
Subjective:    Patient ID: Erin House, female    DOB: 10-17-1950, 65 y.o.   MRN: [id number]  HPI  Erin House, a 65 year old female with a history of diabetes mellitus type 2, essential hypertension, and obesity presents for a follow up of obstructive sleep apnea. Patient was started on a CPAP several months ago. She states that she has been wearing device consistently. I have also reviewed her 90 day therapy reports which shows that she has been wearing device consistently.   Patient generally gets 7 or 8 hours of sleep per night, and states that sleep quality has improved since starting CPAP therapy. Snoring of moderate severity is  present. She denies periods of daytime sleepiness. Apneic episodes have improved. Nasal obstruction is not present.  Patient has not had a tonsillectomy.  Past Medical History  Diagnosis Date  . Diabetes mellitus without complication ([place])   . Hyperlipemia   . Hypertension   . Arthritis     knees  . Sleep apnea     has CPAP but does not wear every night  . 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 Mastectomy Left 05/1999  . Cholecystectomy    . Wisdom tooth extraction    . Rotator cuff repair      LEFT SHOULDER  . Cyst on wrist      LEFT WRIST-BENIGN  . Breast biopsy Right 04/29/2014  . Breast lumpectomy with radioactive seed localization Right 06/11/2014    Procedure: BREAST LUMPECTOMY WITH RADIOACTIVE SEED LOCALIZATION;  Surgeon: Jackolyn Confer, MD;  Location: [facility];  Service: General;  Laterality: Right;  No Known Allergies    Review of Systems  Constitutional: Negative.   HENT: Negative.   Eyes: Negative.   Respiratory: Negative for cough, choking, chest tightness, shortness of breath and wheezing.   Cardiovascular: Negative.  Negative for palpitations and leg swelling.  Gastrointestinal: Negative.   Endocrine: Negative.  Negative for polydipsia, polyphagia and  polyuria.  Genitourinary: Negative.   Musculoskeletal: Negative.   Skin: Negative.   Allergic/Immunologic: Negative.   Neurological: Negative.  Negative for dizziness, tremors and weakness.  Hematological: Negative.   Psychiatric/Behavioral: Negative.        Objective:   Physical Exam  Constitutional: She is oriented to person, place, and time. She appears well-developed and well-nourished. She is not intubated.  HENT:  Head: Normocephalic and atraumatic.  Right Ear: External ear normal.  Left Ear: External ear normal.  Nose: Nose normal.  Mouth/Throat: Oropharynx is clear and moist.  Eyes: Conjunctivae and EOM are normal. Pupils are equal, round, and reactive to light.  Neck: Normal range of motion. Neck supple.  Cardiovascular: Normal rate, regular rhythm, normal heart sounds and intact distal pulses.   Pulmonary/Chest: Effort normal and breath sounds normal. No accessory muscle usage. No apnea, no tachypnea and no bradypnea. She is not intubated. No respiratory distress. She has no decreased breath sounds. She has no wheezes. She has no rales. She exhibits no mass and no tenderness.  Abdominal: Soft. Bowel sounds are normal.  Abdominal obesity  Musculoskeletal: Normal range of motion.  Neurological: She is alert and oriented to person, place, and time. She has normal reflexes.  Skin: Skin is warm and dry.  Psychiatric: She has a normal mood and affect. Her behavior is normal. Judgment and thought content normal.      BP 145/78 mmHg  Pulse 59  Temp(Src) 97.9 F (36.6 C) (Oral)  Ht 5\' 1"  (1.549 m)  Wt 249 lb (112.946 kg)  BMI 47.07 kg/m2  SpO2 98% Assessment & Plan:  Obstructive sleep apnea Reviewed previous sleep study and AIR view therapy reports. [name] has been wearing device consistently. The overall quality of sleep has improved. She denies daytime sleepiness. Patient is to continue wearing CPAP at specified settings.  Recommend that patient continue to follow  a low fat diet and exercise regimen to decrease weight, which will also improve symptoms of sleep apnea.    Routine Health Maintenance:  Patient is up with date with vaccinations Recently sent referral for mammogram Patient is to follow up as scheduled for chronic conditions Recommend a lowfat, low carbohydrate diet divided over 5-6 small meals, increase water intake to 6-8 glasses, and 150 minutes per week of cardiovascular exercise.     RTC: As previously scheduled for DMII and hypertension   [name] M, FNP

## 2015-09-15 ENCOUNTER — Other Ambulatory Visit: Payer: Self-pay | Admitting: Family Medicine

## 2015-09-22 ENCOUNTER — Other Ambulatory Visit: Payer: Self-pay

## 2015-09-22 MED ORDER — SITAGLIPTIN PHOS-METFORMIN HCL 50-1000 MG PO TABS: 1.0000 | ORAL_TABLET | Freq: Two times a day (BID) | ORAL | Status: DC

## 2015-09-22 NOTE — Telephone Encounter (Signed)
Refill for Janumet sent into pharmacy for 90 day rx. Thanks!

## 2015-11-14 ENCOUNTER — Other Ambulatory Visit: Payer: Medicare Other

## 2015-11-19 ENCOUNTER — Ambulatory Visit: Payer: Medicare Other | Admitting: Family Medicine

## 2015-12-09 ENCOUNTER — Other Ambulatory Visit: Payer: Medicare Other

## 2015-12-09 ENCOUNTER — Ambulatory Visit (INDEPENDENT_AMBULATORY_CARE_PROVIDER_SITE_OTHER): Payer: Medicare Other | Admitting: Family Medicine

## 2015-12-09 ENCOUNTER — Encounter: Payer: Self-pay | Admitting: Family Medicine

## 2015-12-09 VITALS — BP 151/62 | HR 78 | Temp 98.7°F | Resp 16 | Ht 60.25 in | Wt 245.0 lb

## 2015-12-09 DIAGNOSIS — E785 Hyperlipidemia, unspecified: Secondary | ICD-10-CM

## 2015-12-09 DIAGNOSIS — I10 Essential (primary) hypertension: Secondary | ICD-10-CM | POA: Diagnosis not present

## 2015-12-09 DIAGNOSIS — Z13820 Encounter for screening for osteoporosis: Secondary | ICD-10-CM | POA: Diagnosis not present

## 2015-12-09 DIAGNOSIS — E138 Other specified diabetes mellitus with unspecified complications: Secondary | ICD-10-CM

## 2015-12-09 LAB — LIPID PANEL
CHOLESTEROL: 141 mg/dL (ref 125–200)
HDL: 46 mg/dL (ref >=46)
LDL Cholesterol: 50 mg/dL (ref <130)
TRIGLYCERIDES: 225 mg/dL — AB (ref <150)
Total CHOL/HDL Ratio: 3.1 Ratio (ref <=5.0)
VLDL: 45 mg/dL — AB (ref <30)

## 2015-12-09 LAB — COMPLETE METABOLIC PANEL WITH GFR
ALT: 27 U/L (ref 6–29)
AST: 18 U/L (ref 10–35)
Albumin: 4.2 g/dL (ref 3.6–5.1)
Alkaline Phosphatase: 60 U/L (ref 33–130)
BUN: 18 mg/dL (ref 7–25)
CALCIUM: 9.8 mg/dL (ref 8.6–10.4)
CHLORIDE: 99 mmol/L (ref 98–110)
CO2: 28 mmol/L (ref 20–31)
CREATININE: 0.75 mg/dL (ref 0.50–0.99)
GFR, EST NON AFRICAN AMERICAN: 83 mL/min (ref >=60)
Glucose, Bld: 135 mg/dL — ABNORMAL HIGH (ref 65–99)
POTASSIUM: 4.2 mmol/L (ref 3.5–5.3)
Sodium: 136 mmol/L (ref 135–146)
Total Bilirubin: 0.7 mg/dL (ref 0.2–1.2)
Total Protein: 6.9 g/dL (ref 6.1–8.1)

## 2015-12-09 LAB — CBC WITH DIFFERENTIAL/PLATELET
BASOS ABS: 0 {cells}/uL (ref 0–200)
Basophils Relative: 0 %
EOS ABS: 118 {cells}/uL (ref 15–500)
Eosinophils Relative: 2 %
HEMATOCRIT: 38.7 % (ref 35.0–45.0)
Hemoglobin: 12.1 g/dL (ref 11.7–15.5)
LYMPHS ABS: 2183 {cells}/uL (ref 850–3900)
LYMPHS PCT: 37 %
MCH: 23.8 pg — AB (ref 27.0–33.0)
MCHC: 31.3 g/dL — AB (ref 32.0–36.0)
MCV: 76 fL — AB (ref 80.0–100.0)
MPV: 10.7 fL (ref 7.5–12.5)
Monocytes Absolute: 354 cells/uL (ref 200–950)
Monocytes Relative: 6 %
NEUTROS ABS: 3245 {cells}/uL (ref 1500–7800)
NEUTROS PCT: 55 %
Platelets: 204 10*3/uL (ref 140–400)
RBC: 5.09 MIL/uL (ref 3.80–5.10)
RDW: 16.7 % — AB (ref 11.0–15.0)
WBC: 5.9 10*3/uL (ref 3.8–10.8)

## 2015-12-09 LAB — TSH: TSH: 0.68 mIU/L

## 2015-12-09 MED ORDER — ROSUVASTATIN CALCIUM 10 MG PO TABS: 10.0000 mg | ORAL_TABLET | Freq: Every day | ORAL | Status: DC

## 2015-12-09 MED ORDER — LISINOPRIL-HYDROCHLOROTHIAZIDE 20-12.5 MG PO TABS: 1.0000 | ORAL_TABLET | Freq: Every day | ORAL | Status: DC

## 2015-12-09 MED ORDER — VITAMIN D (ERGOCALCIFEROL) 1.25 MG (50000 UNIT) PO CAPS: ORAL_CAPSULE | ORAL | Status: DC

## 2015-12-09 MED ORDER — SITAGLIPTIN PHOS-METFORMIN HCL 50-1000 MG PO TABS: 1.0000 | ORAL_TABLET | Freq: Two times a day (BID) | ORAL | Status: DC

## 2015-12-09 NOTE — Patient Instructions (Signed)
Continue current medications. Call orthopedist about pain medication. Follow-up in 6 months unless we notify you otherwise.

## 2015-12-09 NOTE — Progress Notes (Signed)
Patient ID: Erin House, female   DOB: December 12, 1950, 65 y.o.   MRN: [id number]   Retal Erin House, is a 65 y.o. female  [id number]  [id number]  DOB - Jan 11, 1951  CC:  Chief Complaint  Patient presents with  . Hypertension  . Diabetes  . Medication Refill       HPI: Erin House is a 65 y.o. female here to follow up on diabetes, hypertension, hyperlipedemia, and Vitamin D deficiency. She reports doing well except for knee pain for which she sees orthopedist.  She is currently on Janumet, sisinopril/hctz, crestor and Vitamin D. She has sleep apnea with CPAP. She does not follow a strict diabetic diet and does not exercise regularly. She denies tobacco, alcohol or drug use. She is in need of urine micral and bone density scan. Repeat blood pressure 0000000 systolic. She does report following a low salt diet.  No Known Allergies Past Medical History  Diagnosis Date  . Diabetes mellitus without complication (Erin House)   . Hyperlipemia   . Hypertension   . Arthritis     knees  . Sleep apnea     has CPAP but does not wear every night  . Cancer Erin House) 2000    left breast   Current Outpatient Prescriptions on File Prior to Visit  Medication Sig Dispense Refill  . acetaminophen-codeine (TYLENOL #3) 300-30 MG tablet take 1-2 pills twice daily as needed for pain 60 tablet 0  . naproxen (NAPROSYN) 500 MG tablet Take 1 tablet (500 mg total) by mouth 2 (two) times daily with a meal. (Patient not taking: Reported on 12/09/2015) 30 tablet 1   No current facility-administered medications on file prior to visit.   No family history on file. Social History   Social History  . Marital Status: Married    Spouse Name: N/A  . Number of Children: N/A  . Years of Education: N/A   Occupational History  . Not on file.   Social History Main Topics  . Smoking status: Former Smoker    Quit date: 05/24/1993  . Smokeless tobacco: Never Used  . Alcohol Use: 0.0 oz/week    0 Standard drinks or  equivalent per week     Comment: occasionlly  . Drug Use: No  . Sexual Activity: Not on file   Other Topics Concern  . Not on file   Social History Narrative    Review of Systems: Constitutional: Negative for fever, chills, appetite change, weight loss,  Fatigue. Skin: Negative for rashes or lesions of concern. HENT: Negative for ear pain, ear discharge.nose bleeds Eyes: Negative for pain, discharge, redness, itching and visual disturbance. Neck: Negative for pain, stiffness Respiratory: Negative for cough, shortness of breath,   Cardiovascular: Negative for chest pain, palpitations. Positive for some pedal edema with prolonged standing.  Gastrointestinal: Negative for abdominal pain, nausea, vomiting, diarrhea, constipations Genitourinary: Negative for dysuria, urgency, frequency, hematuria,  Musculoskeletal: Negative for back pain, joint  swelling, and gait problem.Negative for weakness.Positive for bilateral knee pain Neurological: Negative for dizziness, tremors, seizures, syncope,   light-headedness, numbness and headaches.  Hematological: Negative for easy bruising or bleeding Psychiatric/Behavioral: Negative for depression, anxiety, decreased concentration, confusion   Objective:   Filed Vitals:   12/09/15 0823  BP: 151/62  Pulse: 78  Temp: 98.7 F (37.1 C)  Resp: 16    Physical Exam: Constitutional: Patient appears well-developed and well-nourished. No distress. HENT: Normocephalic, atraumatic, External right and left ear normal. Oropharynx is clear and moist.  Eyes: Conjunctivae and  EOM are normal. PERRLA, no scleral icterus. Neck: Normal ROM. Neck supple. No lymphadenopathy, No thyromegaly. CVS: RRR, S1/S2 +, no murmurs, no gallops, no rubs Pulmonary: Effort and breath sounds normal, no stridor, rhonchi, wheezes, rales.  Abdominal: Soft. Normoactive BS,, no distension, tenderness, rebound or guarding.  Musculoskeletal: Normal range of motion. No edema and no  tenderness.  Neuro: Alert.Normal muscle tone coordination. Non-focal Skin: Skin is warm and dry. No rash noted. Not diaphoretic. No erythema. No pallor. Psychiatric: Normal mood and affect. Behavior, judgment, thought content normal.  Lab Results  Component Value Date   WBC 5.4 05/01/2015   HGB 11.3* 05/01/2015   HCT 37.0 05/01/2015   MCV 76.4* 05/01/2015   PLT 192 05/01/2015   Lab Results  Component Value Date   CREATININE 0.62 07/25/2015   BUN 13 07/25/2015   NA 138 07/25/2015   K 4.4 07/25/2015   CL 99 07/25/2015   CO2 30 07/25/2015    Lab Results  Component Value Date   HGBA1C 7.7* 08/15/2015   Lipid Panel     Component Value Date/Time   CHOL 169 01/15/2014 0907   TRIG 140 01/15/2014 0907   HDL 45 01/15/2014 0907   CHOLHDL 3.8 01/15/2014 0907   VLDL 28 01/15/2014 0907   LDLCALC 96 01/15/2014 0907       Assessment and plan:   1. Essential hypertension  - CBC with Differential - COMPLETE METABOLIC PANEL WITH GFR - Lipid panel - TSH - - lisinopril-hydrochlorothiazide (ZESTORETIC) 20-12.5 MG tablet; Take 1 tablet by mouth daily.  Dispense: 90 tablet; Refill: 1  2. Hyperlipidemia  - rosuvastatin (CRESTOR) 10 MG tablet; Take 1 tablet (10 mg total) by mouth daily.  Dispense: 90 tablet; Refill: 1  3. Screening for osteoporosis  - DG Bone Density; Future  4. Diabetes mellitus of other type with complication (Erin House)  - Hemoglobin A1c  Microalbumin, urine  No Follow-up on file.  The patient was given clear instructions to go to ER or return to medical center if symptoms don't improve, worsen or new problems develop. The patient verbalized understanding.    Micheline Chapman FNP  12/09/2015, 9:23 AM

## 2015-12-10 LAB — MICROALBUMIN, URINE: MICROALB UR: 1.4 mg/dL

## 2015-12-10 LAB — HEMOGLOBIN A1C
HEMOGLOBIN A1C: 7.4 % — AB (ref <5.7)
MEAN PLASMA GLUCOSE: 166 mg/dL

## 2016-02-20 ENCOUNTER — Ambulatory Visit (INDEPENDENT_AMBULATORY_CARE_PROVIDER_SITE_OTHER): Payer: Medicare Other

## 2016-02-20 DIAGNOSIS — Z23 Encounter for immunization: Secondary | ICD-10-CM

## 2016-04-28 ENCOUNTER — Other Ambulatory Visit: Payer: Self-pay | Admitting: Family Medicine

## 2016-04-28 DIAGNOSIS — R52 Pain, unspecified: Principal | ICD-10-CM

## 2016-04-28 DIAGNOSIS — N644 Mastodynia: Secondary | ICD-10-CM

## 2016-04-28 NOTE — Progress Notes (Signed)
[name], a 65 year old female with a history of left breast cancer. She is currently complaining of left breast pain over the past several weeks. Will order a mammogram. Next appt scheduled for 06/12/2015.   Dorena Dew, FNP

## 2016-05-04 ENCOUNTER — Other Ambulatory Visit: Payer: Self-pay | Admitting: Family Medicine

## 2016-05-04 DIAGNOSIS — N644 Mastodynia: Secondary | ICD-10-CM

## 2016-05-04 DIAGNOSIS — Z853 Personal history of malignant neoplasm of breast: Secondary | ICD-10-CM

## 2016-05-05 ENCOUNTER — Other Ambulatory Visit: Payer: Self-pay | Admitting: Family Medicine

## 2016-05-05 DIAGNOSIS — N644 Mastodynia: Secondary | ICD-10-CM

## 2016-05-05 DIAGNOSIS — Z853 Personal history of malignant neoplasm of breast: Secondary | ICD-10-CM

## 2016-05-19 ENCOUNTER — Other Ambulatory Visit: Payer: Medicare Other

## 2016-05-25 ENCOUNTER — Ambulatory Visit
Admission: RE | Admit: 2016-05-25 | Discharge: 2016-05-25 | Disposition: A | Discharge status: Home / Self Care | Payer: Medicare Other | Source: Ambulatory Visit | Attending: Family Medicine | Admitting: Family Medicine

## 2016-05-25 DIAGNOSIS — N644 Mastodynia: Secondary | ICD-10-CM

## 2016-05-25 DIAGNOSIS — Z853 Personal history of malignant neoplasm of breast: Secondary | ICD-10-CM

## 2016-06-10 ENCOUNTER — Ambulatory Visit: Payer: Medicare Other | Admitting: Family Medicine

## 2016-06-29 ENCOUNTER — Encounter: Payer: Self-pay | Admitting: Family Medicine

## 2016-06-29 ENCOUNTER — Ambulatory Visit (INDEPENDENT_AMBULATORY_CARE_PROVIDER_SITE_OTHER): Payer: Medicare Other | Admitting: Family Medicine

## 2016-06-29 ENCOUNTER — Ambulatory Visit (HOSPITAL_COMMUNITY)
Admission: RE | Admit: 2016-06-29 | Discharge: 2016-06-29 | Disposition: A | Discharge status: Home / Self Care | Payer: Medicare Other | Source: Ambulatory Visit | Attending: Family Medicine | Admitting: Family Medicine

## 2016-06-29 VITALS — BP 136/63 | HR 63 | Temp 98.1°F | Resp 18 | Ht 60.25 in | Wt 249.0 lb

## 2016-06-29 DIAGNOSIS — M79672 Pain in left foot: Secondary | ICD-10-CM | POA: Diagnosis present

## 2016-06-29 DIAGNOSIS — IMO0001 Reserved for inherently not codable concepts without codable children: Secondary | ICD-10-CM

## 2016-06-29 DIAGNOSIS — E785 Hyperlipidemia, unspecified: Secondary | ICD-10-CM

## 2016-06-29 DIAGNOSIS — G8929 Other chronic pain: Secondary | ICD-10-CM

## 2016-06-29 DIAGNOSIS — I10 Essential (primary) hypertension: Secondary | ICD-10-CM | POA: Diagnosis not present

## 2016-06-29 DIAGNOSIS — R600 Localized edema: Secondary | ICD-10-CM

## 2016-06-29 DIAGNOSIS — E1165 Type 2 diabetes mellitus with hyperglycemia: Secondary | ICD-10-CM

## 2016-06-29 DIAGNOSIS — M7732 Calcaneal spur, left foot: Secondary | ICD-10-CM | POA: Insufficient documentation

## 2016-06-29 LAB — POCT URINALYSIS DIP (DEVICE)
Bilirubin Urine: NEGATIVE
GLUCOSE, UA: NEGATIVE mg/dL
Hgb urine dipstick: NEGATIVE
KETONES UR: NEGATIVE mg/dL
Leukocytes, UA: NEGATIVE
NITRITE: NEGATIVE
PROTEIN: NEGATIVE mg/dL
Specific Gravity, Urine: 1.025 (ref 1.005–1.030)
UROBILINOGEN UA: 0.2 mg/dL (ref 0.0–1.0)
pH: 5.5 (ref 5.0–8.0)

## 2016-06-29 LAB — COMPLETE METABOLIC PANEL WITH GFR
ALT: 32 U/L — AB (ref 6–29)
AST: 24 U/L (ref 10–35)
Albumin: 4 g/dL (ref 3.6–5.1)
Alkaline Phosphatase: 57 U/L (ref 33–130)
BILIRUBIN TOTAL: 0.5 mg/dL (ref 0.2–1.2)
BUN: 19 mg/dL (ref 7–25)
CHLORIDE: 101 mmol/L (ref 98–110)
CO2: 30 mmol/L (ref 20–31)
CREATININE: 0.66 mg/dL (ref 0.50–0.99)
Calcium: 9.4 mg/dL (ref 8.6–10.4)
GFR, Est African American: >89 mL/min (ref >=60)
GFR, Est Non African American: >89 mL/min (ref >=60)
Glucose, Bld: 135 mg/dL — ABNORMAL HIGH (ref 65–99)
Potassium: 4.3 mmol/L (ref 3.5–5.3)
Sodium: 139 mmol/L (ref 135–146)
TOTAL PROTEIN: 6.6 g/dL (ref 6.1–8.1)

## 2016-06-29 LAB — CBC WITH DIFFERENTIAL/PLATELET
BASOS ABS: 0 {cells}/uL (ref 0–200)
Basophils Relative: 0 %
EOS ABS: 110 {cells}/uL (ref 15–500)
Eosinophils Relative: 2 %
HEMATOCRIT: 37.6 % (ref 35.0–45.0)
Hemoglobin: 11.4 g/dL — ABNORMAL LOW (ref 11.7–15.5)
LYMPHS PCT: 40 %
Lymphs Abs: 2200 cells/uL (ref 850–3900)
MCH: 23.7 pg — AB (ref 27.0–33.0)
MCHC: 30.3 g/dL — AB (ref 32.0–36.0)
MCV: 78 fL — AB (ref 80.0–100.0)
MONO ABS: 330 {cells}/uL (ref 200–950)
MPV: 10.3 fL (ref 7.5–12.5)
Monocytes Relative: 6 %
NEUTROS PCT: 52 %
Neutro Abs: 2860 cells/uL (ref 1500–7800)
Platelets: 170 10*3/uL (ref 140–400)
RBC: 4.82 MIL/uL (ref 3.80–5.10)
RDW: 16.3 % — AB (ref 11.0–15.0)
WBC: 5.5 10*3/uL (ref 3.8–10.8)

## 2016-06-29 LAB — POCT GLYCOSYLATED HEMOGLOBIN (HGB A1C): HEMOGLOBIN A1C: 7.5

## 2016-06-29 MED ORDER — LISINOPRIL-HYDROCHLOROTHIAZIDE 20-25 MG PO TABS: 1.0000 | ORAL_TABLET | Freq: Every day | ORAL | 3 refills | Status: DC

## 2016-06-29 NOTE — Patient Instructions (Signed)
Diabetes and Foot Care Diabetes may cause you to have problems because of poor blood supply (circulation) to your feet and legs. This may cause the skin on your feet to become thinner, break easier, and heal more slowly. Your skin may become dry, and the skin may peel and crack. You may also have nerve damage in your legs and feet causing decreased feeling in them. You may not notice minor injuries to your feet that could lead to infections or more serious problems. Taking care of your feet is one of the most important things you can do for yourself. Follow these instructions at home:  Wear shoes at all times, even in the house. Do not go barefoot. Bare feet are easily injured.  Check your feet daily for blisters, cuts, and redness. If you cannot see the bottom of your feet, use a mirror or ask someone for help.  Wash your feet with warm water (do not use hot water) and mild soap. Then pat your feet and the areas between your toes until they are completely dry. Do not soak your feet as this can dry your skin.  Apply a moisturizing lotion or petroleum jelly (that does not contain alcohol and is unscented) to the skin on your feet and to dry, brittle toenails. Do not apply lotion between your toes.  Trim your toenails straight across. Do not dig under them or around the cuticle. File the edges of your nails with an emery board or nail file.  Do not cut corns or calluses or try to remove them with medicine.  Wear clean socks or stockings every day. Make sure they are not too tight. Do not wear knee-high stockings since they may decrease blood flow to your legs.  Wear shoes that fit properly and have enough cushioning. To break in new shoes, wear them for just a few hours a day. This prevents you from injuring your feet. Always look in your shoes before you put them on to be sure there are no objects inside.  Do not cross your legs. This may decrease the blood flow to your feet.  If you find a  minor scrape, cut, or break in the skin on your feet, keep it and the skin around it clean and dry. These areas may be cleansed with mild soap and water. Do not cleanse the area with peroxide, alcohol, or iodine.  When you remove an adhesive bandage, be sure not to damage the skin around it.  If you have a wound, look at it several times a day to make sure it is healing.  Do not use heating pads or hot water bottles. They may burn your skin. If you have lost feeling in your feet or legs, you may not know it is happening until it is too late.  Make sure your health care provider performs a complete foot exam at least annually or more often if you have foot problems. Report any cuts, sores, or bruises to your health care provider immediately. Contact a health care provider if:  You have an injury that is not healing.  You have cuts or breaks in the skin.  You have an ingrown nail.  You notice redness on your legs or feet.  You feel burning or tingling in your legs or feet.  You have pain or cramps in your legs and feet.  Your legs or feet are numb.  Your feet always feel cold. Get help right away if:  There is increasing   redness, swelling, or pain in or around a wound.  There is a red line that goes up your leg.  Pus is coming from a wound.  You develop a fever or as directed by your health care provider.  You notice a bad smell coming from an ulcer or wound. This information is not intended to replace advice given to you by your health care provider. Make sure you discuss any questions you have with your health care provider. Document Released: 05/07/2000 Document Revised: 10/16/2015 Document Reviewed: 10/17/2012 Elsevier Interactive Patient Education  2017 [employer]. Diabetes Mellitus and Food It is important for you to manage your blood sugar (glucose) level. Your blood glucose level can be greatly affected by what you eat. Eating healthier foods in the appropriate  amounts throughout the day at about the same time each day will help you control your blood glucose level. It can also help slow or prevent worsening of your diabetes mellitus. Healthy eating may even help you improve the level of your blood pressure and reach or maintain a healthy weight. General recommendations for healthful eating and cooking habits include:  Eating meals and snacks regularly. Avoid going long periods of time without eating to lose weight.  Eating a diet that consists mainly of plant-based foods, such as fruits, vegetables, nuts, legumes, and whole grains.  Using low-heat cooking methods, such as baking, instead of high-heat cooking methods, such as deep frying. Work with your dietitian to make sure you understand how to use the Nutrition Facts information on food labels. How can food affect me? Carbohydrates  Carbohydrates affect your blood glucose level more than any other type of food. Your dietitian will help you determine how many carbohydrates to eat at each meal and teach you how to count carbohydrates. Counting carbohydrates is important to keep your blood glucose at a healthy level, especially if you are using insulin or taking certain medicines for diabetes mellitus. Alcohol  Alcohol can cause sudden decreases in blood glucose (hypoglycemia), especially if you use insulin or take certain medicines for diabetes mellitus. Hypoglycemia can be a life-threatening condition. Symptoms of hypoglycemia (sleepiness, dizziness, and disorientation) are similar to symptoms of having too much alcohol. If your health care provider has given you approval to drink alcohol, do so in moderation and use the following guidelines:  Women should not have more than one drink per day, and men should not have more than two drinks per day. One drink is equal to:  12 oz of beer.  5 oz of wine.  1 oz of hard liquor.  Do not drink on an empty stomach.  Keep yourself hydrated. Have water,  diet soda, or unsweetened iced tea.  Regular soda, juice, and other mixers might contain a lot of carbohydrates and should be counted. What foods are not recommended? As you make food choices, it is important to remember that all foods are not the same. Some foods have fewer nutrients per serving than other foods, even though they might have the same number of calories or carbohydrates. It is difficult to get your body what it needs when you eat foods with fewer nutrients. Examples of foods that you should avoid that are high in calories and carbohydrates but low in nutrients include:  Trans fats (most processed foods list trans fats on the Nutrition Facts label).  Regular soda.  Juice.  Candy.  Sweets, such as cake, pie, doughnuts, and cookies.  Fried foods. What foods can I eat? Eat nutrient-rich foods,  which will nourish your body and keep you healthy. The food you should eat also will depend on several factors, including:  The calories you need.  The medicines you take.  Your weight.  Your blood glucose level.  Your blood pressure level.  Your cholesterol level. You should eat a variety of foods, including:  Protein.  Lean cuts of meat.  Proteins low in saturated fats, such as fish, egg whites, and beans. Avoid processed meats.  Fruits and vegetables.  Fruits and vegetables that may help control blood glucose levels, such as apples, mangoes, and yams.  Dairy products.  Choose fat-free or low-fat dairy products, such as milk, yogurt, and cheese.  Grains, bread, pasta, and rice.  Choose whole grain products, such as multigrain bread, whole oats, and brown rice. These foods may help control blood pressure.  Fats.  Foods containing healthful fats, such as nuts, avocado, olive oil, canola oil, and fish. Does everyone with diabetes mellitus have the same meal plan? Because every person with diabetes mellitus is different, there is not one meal plan that works for  everyone. It is very important that you meet with a dietitian who will help you create a meal plan that is just right for you. This information is not intended to replace advice given to you by your health care provider. Make sure you discuss any questions you have with your health care provider. Document Released: 02/04/2005 Document Revised: 10/16/2015 Document Reviewed: 04/06/2013 Elsevier Interactive Patient Education  2017 [employer].

## 2016-06-29 NOTE — Progress Notes (Signed)
Subjective:    Patient ID: Erin House, female    DOB: 02-Aug-1950, 66 y.o.   MRN: [id number] [name], a 66 year old female with a history of diabetes mellitus type 2, essential hypertension, and obesity presents for a follow up of chronic conditions. . Patient was started on a CPAP several months ago Diabetes  She presents for her follow-up diabetic visit. She has type 2 diabetes mellitus. Pertinent negatives for hypoglycemia include no confusion, dizziness, headaches, hunger, mood changes, nervousness/anxiousness, pallor, seizures, sleepiness, speech difficulty, sweats or tremors. Pertinent negatives for diabetes include no blurred vision, no chest pain, no fatigue, no foot paresthesias, no foot ulcerations, no polydipsia, no polyphagia, no polyuria, no visual change and no weakness. Pertinent negatives for hypoglycemia complications include no blackouts, no hospitalization, no required assistance and no required glucagon injection. Symptoms are stable. Pertinent negatives for diabetic complications include no CVA, PVD or retinopathy. Risk factors for coronary artery disease include dyslipidemia, hypertension, sedentary lifestyle and post-menopausal. Current diabetic treatment includes oral agent (dual therapy). She is compliant with treatment most of the time. She is following a high fat/cholesterol (She says that she has not been following a carbohydrate modified diet. ) diet. She has had a previous visit with a dietitian. She participates in exercise intermittently. An ACE inhibitor/angiotensin II receptor blocker is being taken. She sees a podiatrist.Eye exam is current.  Hypertension  This is a chronic problem. The current episode started more than 1 year ago. The problem is controlled. Associated symptoms include peripheral edema (primarily ankles). Pertinent negatives include no blurred vision, chest pain, headaches, palpitations, shortness of breath or sweats. Risk factors for  coronary artery disease include dyslipidemia, sedentary lifestyle and obesity. Past treatments include ACE inhibitors and diuretics. There are no compliance problems.  There is no history of angina, kidney disease, CAD/MI, CVA, heart failure, left ventricular hypertrophy, PVD or retinopathy. Identifiable causes of hypertension include sleep apnea. There is no history of a thyroid problem.     Past Medical History:  Diagnosis Date  . Arthritis    knees  . 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 Diabetes mellitus without complication ([place])   . Hyperlipemia   . Hypertension   . Sleep apnea    has CPAP but does not wear every night   Past Surgical History:  Procedure Laterality Date  . BREAST BIOPSY Right 04/29/2014  . BREAST LUMPECTOMY WITH RADIOACTIVE SEED LOCALIZATION Right 06/11/2014   Procedure: BREAST LUMPECTOMY WITH RADIOACTIVE SEED LOCALIZATION;  Surgeon: Jackolyn Confer, MD;  Location: [facility];  Service: General;  Laterality: Right;  . CHOLECYSTECTOMY    . CYST ON WRIST     LEFT WRIST-BENIGN  . MASTECTOMY Left 05/1999  . ROTATOR CUFF REPAIR     LEFT SHOULDER  . WISDOM TOOTH EXTRACTION    No Known Allergies    Review of Systems  Constitutional: Negative.  Negative for fatigue.  HENT: Negative.   Eyes: Negative.  Negative for blurred vision.  Respiratory: Positive for apnea. Negative for cough, choking, chest tightness, shortness of breath and wheezing.   Cardiovascular: Positive for leg swelling. Negative for chest pain and palpitations.  Gastrointestinal: Negative.   Endocrine: Negative.  Negative for polydipsia, polyphagia and polyuria.  Genitourinary: Negative.   Musculoskeletal: Positive for myalgias.       Left heel pain for greater than 1 year  Skin: Negative.  Negative for pallor.  Allergic/Immunologic: Negative.   Neurological: Negative.  Negative for  dizziness, tremors, seizures, speech difficulty, weakness and headaches.  Hematological: Negative.     Psychiatric/Behavioral: Negative.  Negative for confusion. The patient is not nervous/anxious.        Objective:   Physical Exam  Constitutional: She is oriented to person, place, and time. She appears well-developed and well-nourished. She is not intubated.  HENT:  Head: Normocephalic and atraumatic.  Right Ear: External ear normal.  Left Ear: External ear normal.  Nose: Nose normal.  Mouth/Throat: Oropharynx is clear and moist.  Eyes: Conjunctivae and EOM are normal. Pupils are equal, round, and reactive to light.  Neck: Normal range of motion. Neck supple.  Cardiovascular: Normal rate, regular rhythm, normal heart sounds and intact distal pulses.   2+ bilateral lower extremity edema  Pulmonary/Chest: Effort normal and breath sounds normal. No accessory muscle usage. No apnea, no tachypnea and no bradypnea. She is not intubated. No respiratory distress. She has no decreased breath sounds. She has no wheezes. She has no rales. She exhibits no mass and no tenderness.  Abdominal: Soft. Bowel sounds are normal.  Abdominal obesity  Musculoskeletal:       Left foot: There is decreased range of motion and tenderness.       Feet:  Neurological: She is alert and oriented to person, place, and time. She has normal reflexes.  Skin: Skin is warm and dry.  Psychiatric: She has a normal mood and affect. Her behavior is normal. Judgment and thought content normal.      BP 136/63 (BP Location: Right Arm, Patient Position: Sitting, Cuff Size: Large)   Pulse 63   Temp 98.1 F (36.7 C) (Oral)   Resp 18   Ht 5' 0.25" (1.53 m)   Wt 249 lb (112.9 kg)   SpO2 99%   BMI 48.23 kg/m  Assessment & Plan:  1. Uncontrolled type 2 diabetes mellitus without complication, without long-term current use of insulin (HCC) Recommend a lowfat, low carbohydrate diet divided over 5-6 small meals, increase water intake to 6-8 glasses, and 150 minutes per week of cardiovascular exercise.   - COMPLETE METABOLIC  PANEL WITH GFR - CBC with Differential - HgB A1c  2. Essential hypertension Blood pressure is at goal on current medication regimen.  - lisinopril-hydrochlorothiazide (PRINZIDE,ZESTORETIC) 20-25 MG tablet; Take 1 tablet by mouth daily.  Dispense: 90 tablet; Refill: 3 - COMPLETE METABOLIC PANEL WITH GFR  3. Hyperlipidemia, unspecified hyperlipidemia type The patient is asked to make an attempt to improve diet and exercise patterns to aid in medical management of this problem.  4. Bilateral lower extremity edema Elevate lower extremities to heart level .  - Brain natriuretic peptide  5. Heel pain, chronic, left Apply ice therapy as discussed.  - DG Foot Complete Left; Future     Routine Health Maintenance:  Patient is up with date with vaccinations Recently sent referral for mammogram Patient is to follow up as scheduled for chronic conditions Recommend a lowfat, low carbohydrate diet divided over 5-6 small meals, increase water intake to 6-8 glasses, and 150 minutes per week of cardiovascular exercise.     RTC: 3 months for  DMII and hypertension   Aloha Bartok M, FNP

## 2016-06-30 ENCOUNTER — Other Ambulatory Visit: Payer: Self-pay | Admitting: Family Medicine

## 2016-06-30 ENCOUNTER — Telehealth: Payer: Self-pay

## 2016-06-30 DIAGNOSIS — M8580 Other specified disorders of bone density and structure, unspecified site: Secondary | ICD-10-CM

## 2016-06-30 LAB — BRAIN NATRIURETIC PEPTIDE: Brain Natriuretic Peptide: <4 pg/mL (ref <100)

## 2016-06-30 NOTE — Telephone Encounter (Signed)
Patient called back. I advised of xray results and the need to wear shoes with support inserts and practice stretching exercises. Advised if pain continues we will sent to ortho. Patient verbalized understanding. Thanks!

## 2016-06-30 NOTE — Telephone Encounter (Signed)
-----   Message from Dorena Dew, [place] sent at 06/29/2016  4:00 PM EST ----- Regarding: image resutls Please inform patient that xray showed heel spurs. Recommend wearing shoes with inserts and  Gentle  Stretching exercises. She can make appointment with orthopedics for further treatment and evaluation of heel spur if pain increases further.  ----- Message ----- From: Interface, Rad Results In Sent: 06/29/2016   1:37 PM To: Dorena Dew, FNP

## 2016-06-30 NOTE — Telephone Encounter (Signed)
-----   Message from Dorena Dew, [place] sent at 06/29/2016  4:00 PM EST ----- Regarding: image resutls Please inform patient that xray showed heel spurs. Recommend wearing shoes with inserts and  Gentle  Stretching exercises. She can make appointment with orthopedics for further treatment and evaluation of heel spur if pain increases further.  ----- Message ----- From: Interface, Rad Results In Sent: 06/29/2016   1:37 PM To: Dorena Dew, FNP

## 2016-06-30 NOTE — Telephone Encounter (Signed)
Called and left message.

## 2016-07-20 ENCOUNTER — Telehealth: Payer: Self-pay

## 2016-07-20 NOTE — Telephone Encounter (Signed)
Called, and spoke with patient. This has been updated and advised patient to call and schedule an appointment. Thanks!

## 2016-07-28 ENCOUNTER — Ambulatory Visit (INDEPENDENT_AMBULATORY_CARE_PROVIDER_SITE_OTHER): Payer: Medicare Other | Admitting: Podiatry

## 2016-07-28 DIAGNOSIS — L84 Corns and callosities: Secondary | ICD-10-CM

## 2016-07-28 DIAGNOSIS — Q828 Other specified congenital malformations of skin: Secondary | ICD-10-CM | POA: Diagnosis not present

## 2016-07-28 DIAGNOSIS — B351 Tinea unguium: Secondary | ICD-10-CM | POA: Diagnosis not present

## 2016-07-28 DIAGNOSIS — L608 Other nail disorders: Secondary | ICD-10-CM | POA: Diagnosis not present

## 2016-07-28 DIAGNOSIS — L603 Nail dystrophy: Secondary | ICD-10-CM | POA: Diagnosis not present

## 2016-07-28 DIAGNOSIS — M7662 Achilles tendinitis, left leg: Secondary | ICD-10-CM | POA: Diagnosis not present

## 2016-07-28 DIAGNOSIS — E0843 Diabetes mellitus due to underlying condition with diabetic autonomic (poly)neuropathy: Secondary | ICD-10-CM

## 2016-07-28 DIAGNOSIS — M7732 Calcaneal spur, left foot: Secondary | ICD-10-CM | POA: Diagnosis not present

## 2016-07-28 DIAGNOSIS — M79609 Pain in unspecified limb: Secondary | ICD-10-CM

## 2016-08-03 MED ORDER — BETAMETHASONE SOD PHOS & ACET 6 (3-3) MG/ML IJ SUSP
3.0000 mg | Freq: Once | INTRAMUSCULAR | Status: DC
Start: 2016-08-03 — End: 2017-12-26

## 2016-08-03 NOTE — Progress Notes (Signed)
   Subjective:  Patient presents today as a referral from Dr. Amalia Hailey for evaluation of left foot pain. Patient was recently seen at the primary care physician at which time x-rays were taken and he was diagnosed with a heel spur to the left foot. Patient also has a history of diabetes mellitus presents for her routine annual checkup. Patient complains of elongated, thickened, hyperkeratotic toenails 1-5 bilateral. She also complains of painful callus lesion to the fifth digit right foot.    Objective/Physical Exam General: The patient is alert and oriented x3 in no acute distress.  Dermatology: Hyperkeratotic, discolored, thickened nails noted 1-5 bilateral. Hyperkeratotic callus lesion also noted overlying the fifth digit of the right foot. Skin is warm, dry and supple bilateral lower extremities. Negative for open lesions or macerations.  Vascular: Palpable pedal pulses bilaterally. No edema or erythema noted. Capillary refill within normal limits.  Neurological: Epicritic and protective threshold grossly intact bilaterally.   Musculoskeletal Exam: Pain on direct palpation to the posterior aspect of the left heel consistent with a posterior heel spur. Pain on palpation also noted to the plantar medial calcaneal tubercle at the insertion of the plantar fascial left foot. Radiographic exam taken by primary care physician is consistent with a posterior heel spur to the left lower extremity.  Assessment: #1 posterior heel spur left lower extremity #2 plantar fasciitis left #3 onychomycosis #4 diabetes mellitus #5 pre-ulcerative callus lesion fifth digit right foot   Plan of Care:  #1 Patient was evaluated. #2 mechanical debridement of nails 1-5 was performed bilateral lower extremities using a nail nipper without incident or bleeding #3 callus lesion was sharply debrided using a chisel blade without incident or bleeding. #4 injection of 0.5 mL Celestone Soluspan injected into the  posterior heel of the left lower extremity. Care was taken to to avoid direct injection into the Achilles tendon. #5 recommend accommodative shoe gear with a heel lift offload the Achilles tendon #6 return to clinic in 4 weeks   Edrick Kins, DPM [facility]  Dr. Edrick Kins, [address]                                        [place], [place] [address]                Office [phone]  Fax [phone]

## 2016-08-18 ENCOUNTER — Ambulatory Visit (INDEPENDENT_AMBULATORY_CARE_PROVIDER_SITE_OTHER): Payer: Medicare Other | Admitting: Family Medicine

## 2016-08-18 ENCOUNTER — Encounter: Payer: Self-pay | Admitting: Family Medicine

## 2016-08-18 VITALS — BP 154/68 | HR 71 | Temp 99.2°F | Ht 60.25 in | Wt 244.0 lb

## 2016-08-18 DIAGNOSIS — R509 Fever, unspecified: Secondary | ICD-10-CM

## 2016-08-18 DIAGNOSIS — R05 Cough: Secondary | ICD-10-CM | POA: Diagnosis not present

## 2016-08-18 DIAGNOSIS — J069 Acute upper respiratory infection, unspecified: Secondary | ICD-10-CM | POA: Diagnosis not present

## 2016-08-18 DIAGNOSIS — R059 Cough, unspecified: Secondary | ICD-10-CM

## 2016-08-18 LAB — POCT INFLUENZA A/B
INFLUENZA B, POC: NEGATIVE
Influenza A, POC: NEGATIVE

## 2016-08-18 MED ORDER — CHLORPHEN-PE-ACETAMINOPHEN 4-10-325 MG PO TABS: 1.0000 | ORAL_TABLET | Freq: Four times a day (QID) | ORAL | 1 refills | Status: DC | PRN

## 2016-08-18 MED ORDER — BENZONATATE 100 MG PO CAPS: 100.0000 mg | ORAL_CAPSULE | Freq: Two times a day (BID) | ORAL | 0 refills | Status: DC | PRN

## 2016-08-18 MED ORDER — AZITHROMYCIN 250 MG PO TABS: ORAL_TABLET | ORAL | 0 refills | Status: DC

## 2016-08-18 NOTE — Patient Instructions (Addendum)
Upper Respiratory Infection:  Start Azithromycin 500 mg day 1, days 2-5 take 250 mg.  Norel AD every 6 hours as needed Tessalon Perles three times per day as needed for persistent cough Increase fluid intake, rest and handwashing   Upper Respiratory Infection, Adult Most upper respiratory infections (URIs) are caused by a virus. A URI affects the nose, throat, and upper air passages. The most common type of URI is often called "the common cold." Follow these instructions at home:  Take medicines only as told by your doctor.  Gargle warm saltwater or take cough drops to comfort your throat as told by your doctor.  Use a warm mist humidifier or inhale steam from a shower to increase air moisture. This may make it easier to breathe.  Drink enough fluid to keep your pee (urine) clear or pale yellow.  Eat soups and other clear broths.  Have a healthy diet.  Rest as needed.  Go back to work when your fever is gone or your doctor says it is okay.  You may need to stay home longer to avoid giving your URI to others.  You can also wear a face mask and wash your hands often to prevent spread of the virus.  Use your inhaler more if you have asthma.  Do not use any tobacco products, including cigarettes, chewing tobacco, or electronic cigarettes. If you need help quitting, ask your doctor. Contact a doctor if:  You are getting worse, not better.  Your symptoms are not helped by medicine.  You have chills.  You are getting more short of breath.  You have brown or red mucus.  You have yellow or brown discharge from your nose.  You have pain in your face, especially when you bend forward.  You have a fever.  You have puffy (swollen) neck glands.  You have pain while swallowing.  You have white areas in the back of your throat. Get help right away if:  You have very bad or constant:  Headache.  Ear pain.  Pain in your forehead, behind your eyes, and over your  cheekbones (sinus pain).  Chest pain.  You have long-lasting (chronic) lung disease and any of the following:  Wheezing.  Long-lasting cough.  Coughing up blood.  A change in your usual mucus.  You have a stiff neck.  You have changes in your:  Vision.  Hearing.  Thinking.  Mood. This information is not intended to replace advice given to you by your health care provider. Make sure you discuss any questions you have with your health care provider. Document Released: 10/27/2007 Document Revised: 01/11/2016 Document Reviewed: 08/15/2013 Elsevier Interactive Patient Education  2017 [employer].  Upper Respiratory Infection, Adult Most upper respiratory infections (URIs) are caused by a virus. A URI affects the nose, throat, and upper air passages. The most common type of URI is often called "the common cold." Follow these instructions at home:  Take medicines only as told by your doctor.  Gargle warm saltwater or take cough drops to comfort your throat as told by your doctor.  Use a warm mist humidifier or inhale steam from a shower to increase air moisture. This may make it easier to breathe.  Drink enough fluid to keep your pee (urine) clear or pale yellow.  Eat soups and other clear broths.  Have a healthy diet.  Rest as needed.  Go back to work when your fever is gone or your doctor says it is okay.  You  may need to stay home longer to avoid giving your URI to others.  You can also wear a face mask and wash your hands often to prevent spread of the virus.  Use your inhaler more if you have asthma.  Do not use any tobacco products, including cigarettes, chewing tobacco, or electronic cigarettes. If you need help quitting, ask your doctor. Contact a doctor if:  You are getting worse, not better.  Your symptoms are not helped by medicine.  You have chills.  You are getting more short of breath.  You have brown or red mucus.  You have yellow or brown  discharge from your nose.  You have pain in your face, especially when you bend forward.  You have a fever.  You have puffy (swollen) neck glands.  You have pain while swallowing.  You have white areas in the back of your throat. Get help right away if:  You have very bad or constant:  Headache.  Ear pain.  Pain in your forehead, behind your eyes, and over your cheekbones (sinus pain).  Chest pain.  You have long-lasting (chronic) lung disease and any of the following:  Wheezing.  Long-lasting cough.  Coughing up blood.  A change in your usual mucus.  You have a stiff neck.  You have changes in your:  Vision.  Hearing.  Thinking.  Mood. This information is not intended to replace advice given to you by your health care provider. Make sure you discuss any questions you have with your health care provider. Document Released: 10/27/2007 Document Revised: 01/11/2016 Document Reviewed: 08/15/2013 Elsevier Interactive Patient Education  2017 [employer].

## 2016-08-18 NOTE — Progress Notes (Signed)
Subjective:    Patient ID: Erin House, female    DOB: 09-11-1950, 66 y.o.   MRN: [id number]  URI   This is a new problem. The current episode started 1 to 4 weeks ago. Maximum temperature: Low grade 99.6. The fever has been present for less than 1 day. Associated symptoms include congestion, coughing, headaches, rhinorrhea, sinus pain and sneezing. Pertinent negatives include no abdominal pain, diarrhea, dysuria, ear pain, joint swelling, nausea, neck pain, plugged ear sensation, rash, sore throat, swollen glands, vomiting or wheezing. She has tried nothing for the symptoms.   Past Medical History:  Diagnosis Date  . Arthritis    knees  . 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 Diabetes mellitus without complication ([place])   . Hyperlipemia   . Hypertension   . Sleep apnea    has CPAP but does not wear every night   Social History   Social History  . Marital status: Married    Spouse name: N/A  . Number of children: N/A  . Years of education: N/A   Occupational History  . Not on file.   Social History Main Topics  . Smoking status: Former Smoker    Quit date: 05/24/1993  . Smokeless tobacco: Never Used  . Alcohol use 0.0 oz/week     Comment: occasionlly  . Drug use: No  . Sexual activity: Not on file   Other Topics Concern  . Not on file   Social History Narrative  . No narrative on file   Immunization History  Administered Date(s) Administered  . Influenza,inj,Quad PF,36+ Mos 02/18/2014, 01/30/2015, 02/20/2016  . Pneumococcal Conjugate-13 08/15/2015  . Pneumococcal Polysaccharide-23 04/11/2014  . Tdap 07/15/2014  . Zoster 04/11/2014     Review of Systems  Constitutional: Positive for fatigue.  HENT: Positive for congestion, rhinorrhea, sinus pain and sneezing. Negative for ear pain and sore throat.   Eyes: Negative for visual disturbance.  Respiratory: Positive for cough. Negative for wheezing.   Gastrointestinal: Negative.  Negative for abdominal pain,  diarrhea, nausea and vomiting.  Endocrine: Negative.  Negative for polydipsia, polyphagia and polyuria.  Genitourinary: Negative.  Negative for dysuria.  Musculoskeletal: Negative for neck pain.  Skin: Negative for rash.  Neurological: Positive for headaches.  Hematological: Negative.   Psychiatric/Behavioral: Negative.        Objective:   Physical Exam  Constitutional: She is oriented to person, place, and time. She appears well-developed and well-nourished.  HENT:  Head: Normocephalic and atraumatic.  Right Ear: External ear normal. Tympanic membrane is erythematous.  Left Ear: Tympanic membrane is erythematous.  Nose: Mucosal edema present.  Mouth/Throat: Posterior oropharyngeal edema present.  Eyes: Conjunctivae and EOM are normal. Pupils are equal, round, and reactive to light.  Neck: Normal range of motion. Neck supple.  Cardiovascular: Normal rate, regular rhythm, normal heart sounds and intact distal pulses.   Abdominal: Soft. Bowel sounds are normal.  Neurological: She is alert and oriented to person, place, and time. She has normal reflexes.  Skin: Skin is warm and dry.  Psychiatric: She has a normal mood and affect. Her behavior is normal. Judgment and thought content normal.      BP (!) 154/68 (BP Location: Right Arm, Patient Position: Sitting, Cuff Size: Large)   Pulse 71   Temp 99.2 F (37.3 C) (Oral)   Ht 5' 0.25" (1.53 m)   Wt 244 lb (110.7 kg)   SpO2 99%   BMI 47.26 kg/m  Assessment & Plan:  1. Low grade fever Negative for influenza - POCT Influenza A/B  2. Cough in adult - POCT Influenza A/B - benzonatate (TESSALON) 100 MG capsule; Take 1 capsule (100 mg total) by mouth 2 (two) times daily as needed for cough.  Dispense: 20 capsule; Refill: 0  3. Upper respiratory tract infection, unspecified type Increase hydration, rest, and handwashing  - azithromycin (ZITHROMAX) 250 MG tablet; Take 500 mg today, days 2-5 take 250 mg  Dispense: 6 tablet; Refill:  0 - Chlorphen-PE-Acetaminophen (NOREL AD) 4-10-325 MG TABS; Take 1 tablet by mouth every 6 (six) hours as needed.  Dispense: 20 tablet; Refill: 1   RTC: As previously scheduled for chronic conditions   Erin House Al [place]  MSN, FNP-C [facility] [address] [place], [place] [address] [phone]

## 2016-09-22 ENCOUNTER — Encounter: Payer: Self-pay | Admitting: Family Medicine

## 2016-09-22 LAB — HM DIABETES EYE EXAM

## 2016-09-28 ENCOUNTER — Ambulatory Visit: Payer: Medicare Other | Admitting: Family Medicine

## 2016-10-01 ENCOUNTER — Ambulatory Visit: Payer: Medicare Other | Admitting: Family Medicine

## 2016-10-06 ENCOUNTER — Encounter: Payer: Self-pay | Admitting: Family Medicine

## 2016-10-06 ENCOUNTER — Other Ambulatory Visit: Payer: Self-pay | Admitting: Family Medicine

## 2016-10-06 ENCOUNTER — Ambulatory Visit (INDEPENDENT_AMBULATORY_CARE_PROVIDER_SITE_OTHER): Payer: Medicare Other | Admitting: Family Medicine

## 2016-10-06 VITALS — BP 152/86 | HR 58 | Temp 98.2°F | Resp 16 | Ht 60.25 in | Wt 253.0 lb

## 2016-10-06 DIAGNOSIS — R600 Localized edema: Secondary | ICD-10-CM | POA: Diagnosis not present

## 2016-10-06 DIAGNOSIS — E785 Hyperlipidemia, unspecified: Secondary | ICD-10-CM

## 2016-10-06 DIAGNOSIS — E1165 Type 2 diabetes mellitus with hyperglycemia: Secondary | ICD-10-CM

## 2016-10-06 DIAGNOSIS — R0600 Dyspnea, unspecified: Secondary | ICD-10-CM | POA: Diagnosis not present

## 2016-10-06 DIAGNOSIS — I10 Essential (primary) hypertension: Secondary | ICD-10-CM | POA: Diagnosis not present

## 2016-10-06 DIAGNOSIS — R0982 Postnasal drip: Secondary | ICD-10-CM | POA: Diagnosis not present

## 2016-10-06 DIAGNOSIS — IMO0001 Reserved for inherently not codable concepts without codable children: Secondary | ICD-10-CM

## 2016-10-06 LAB — COMPLETE METABOLIC PANEL WITH GFR
ALBUMIN: 3.8 g/dL (ref 3.6–5.1)
ALT: 87 U/L — ABNORMAL HIGH (ref 6–29)
AST: 58 U/L — ABNORMAL HIGH (ref 10–35)
Alkaline Phosphatase: 118 U/L (ref 33–130)
BUN: 15 mg/dL (ref 7–25)
CO2: 28 mmol/L (ref 20–31)
Calcium: 9.4 mg/dL (ref 8.6–10.4)
Chloride: 100 mmol/L (ref 98–110)
Creat: 0.62 mg/dL (ref 0.50–0.99)
Glucose, Bld: 224 mg/dL — ABNORMAL HIGH (ref 65–99)
POTASSIUM: 4.4 mmol/L (ref 3.5–5.3)
Sodium: 137 mmol/L (ref 135–146)
TOTAL PROTEIN: 6.5 g/dL (ref 6.1–8.1)
Total Bilirubin: 0.6 mg/dL (ref 0.2–1.2)

## 2016-10-06 LAB — POCT GLYCOSYLATED HEMOGLOBIN (HGB A1C): HEMOGLOBIN A1C: 9

## 2016-10-06 MED ORDER — CETIRIZINE HCL 10 MG PO TABS: 10.0000 mg | ORAL_TABLET | Freq: Every day | ORAL | 11 refills | Status: DC

## 2016-10-06 NOTE — Progress Notes (Signed)
Subjective:    Patient ID: Erin House, female    DOB: Jun 25, 1950, 66 y.o.   MRN: [id number]  HPI   [name], a 66 year old female with a history of type 2 diabetes mellitus, hypertension, and morbid obesity presents for a follow up of chronic conditions.   She has a history type 2 diabetes mellitus. She says that she has been taking medication consistently, but has not been following a lowfat, low carbohydrate diet. She says that her appetite has increase over the past several months. She endorses weight gain. She denies fatigue, dysuria, polyuria, abdominal pain, polydipsia, polyphagia, nausea, vomiting, or diarrhea. She says that she has been doing water aerobics 2-3 days per week at the [facility].   She also has a history of hypertension.  She does not check blood pressures at home. Cardiac symptoms include occasional dyspnea, bilateral lower extremity edema, and heart palpitations. Patient denies claudication, irregular heart beat, near-syncope and tachypnea.  Cardiovascular risk factors include: diabetes mellitus, dyslipidemia and obesity (BMI >= 30 kg/m2).  Past Medical History:  Diagnosis Date  . Arthritis    knees  . Cancer ([place]) 2000   left breast  . Diabetes mellitus without complication ([place])   . Hyperlipemia   . Hypertension   . Sleep apnea    has CPAP but does not wear every night   Social History   Social History  . Marital status: Married    Spouse name: N/A  . Number of children: N/A  . Years of education: N/A   Occupational History  . Not on file.   Social History Main Topics  . Smoking status: Former Smoker    Quit date: 05/24/1993  . Smokeless tobacco: Never Used  . Alcohol use 0.0 oz/week     Comment: occasionlly  . Drug use: No  . Sexual activity: Not on file   Other Topics Concern  . Not on file   Social History Narrative  . No narrative on file   Immunization History  Administered Date(s) Administered  . Influenza,inj,Quad PF,36+  Mos 02/18/2014, 01/30/2015, 02/20/2016  . Pneumococcal Conjugate-13 08/15/2015  . Pneumococcal Polysaccharide-23 04/11/2014  . Tdap 07/15/2014  . Zoster 04/11/2014   Review of Systems  Constitutional: Positive for fatigue and unexpected weight change (weight gain).  HENT: Negative for ear pain and sore throat.   Eyes: Negative for visual disturbance.  Respiratory: Positive for shortness of breath. Negative for wheezing.   Cardiovascular: Positive for chest pain and leg swelling.  Gastrointestinal: Negative.  Negative for abdominal pain, diarrhea, nausea and vomiting.  Endocrine: Negative.  Negative for polydipsia, polyphagia and polyuria.  Genitourinary: Negative.  Negative for dysuria.  Musculoskeletal: Negative for neck pain.  Skin: Negative for rash.  Hematological: Negative.   Psychiatric/Behavioral: Negative.        Objective:   Physical Exam  Constitutional: She is oriented to person, place, and time. She appears well-developed and well-nourished.  Morbid obesity  HENT:  Head: Normocephalic and atraumatic.  Right Ear: External ear normal.  Eyes: Conjunctivae and EOM are normal. Pupils are equal, round, and reactive to light.  Neck: Normal range of motion. Neck supple.  Cardiovascular: Normal rate, regular rhythm, normal heart sounds and intact distal pulses.   No murmur heard. 2+ pitting edema to lower extremities  Pulmonary/Chest: No respiratory distress. She has no wheezes.  Abdominal: Soft. Bowel sounds are normal.  Increased abdominal girth  Neurological: She is alert and oriented to person, place, and time.  She has normal reflexes.  Skin: Skin is warm and dry.  Psychiatric: She has a normal mood and affect. Her behavior is normal. Judgment and thought content normal.      BP (!) 152/86 (BP Location: Right Arm, Patient Position: Sitting, Cuff Size: Large) Comment: manual  Pulse (!) 58   Temp 98.2 F (36.8 C) (Oral)   Resp 16   Ht 5' 0.25" (1.53 m)   Wt 253 lb  (114.8 kg)   SpO2 99%   BMI 49.00 kg/m  Assessment & Plan:   1. Uncontrolled type 2 diabetes mellitus without complication, without long-term current use of insulin ([place]) Patient's A1c has increased from 7.5 to 9.0. Discussed starting insulin with patient at length. She is adamant about starting insulin at this time. She was given written materials. I will continue Janumet and add Invokana to medication regimen.  - HgB A1c - COMPLETE METABOLIC PANEL WITH GFR - canagliflozin (INVOKANA) 100 MG TABS tablet; Take 1 tablet (100 mg total) by mouth daily before breakfast.  Dispense: 30 tablet; Refill: 1  2. Dyspnea, unspecified type  - ECHOCARDIOGRAM COMPLETE; Future - COMPLETE METABOLIC PANEL WITH GFR  3. Bilateral lower extremity edema Will start a trial of Furosemide 20 mg daily.  Check weights daily - ECHOCARDIOGRAM COMPLETE; Future - Brain natriuretic peptide - EKG 12-Lead  4. Hyperlipidemia, unspecified hyperlipidemia type - EKG 12-Lead  5. Essential hypertension Blood pressure is above goal. Blood pressure check in 1 week.   - EKG 12-Lead  6. Post-nasal drip - cetirizine (ZYRTEC) 10 MG tablet; Take 1 tablet (10 mg total) by mouth daily.  Dispense: 30 tablet; Refill: 11   RTC: 1 week for blood pressure check. 1 month for diabetes mellitis   Donia Pounds  MSN, FNP-C [facility] [address] [place], [place] [address] [phone]

## 2016-10-07 LAB — POCT URINALYSIS DIP (DEVICE)
Bilirubin Urine: NEGATIVE
GLUCOSE, UA: 100 mg/dL — AB
Hgb urine dipstick: NEGATIVE
KETONES UR: NEGATIVE mg/dL
Leukocytes, UA: NEGATIVE
Nitrite: NEGATIVE
PROTEIN: 30 mg/dL — AB
Specific Gravity, Urine: 1.025 (ref 1.005–1.030)
UROBILINOGEN UA: 1 mg/dL (ref 0.0–1.0)
pH: 6 (ref 5.0–8.0)

## 2016-10-07 LAB — BRAIN NATRIURETIC PEPTIDE: BRAIN NATRIURETIC PEPTIDE: 36.1 pg/mL (ref <100)

## 2016-10-11 ENCOUNTER — Ambulatory Visit (HOSPITAL_COMMUNITY)
Admission: RE | Admit: 2016-10-11 | Discharge: 2016-10-11 | Disposition: A | Discharge status: Home / Self Care | Payer: Medicare Other | Source: Ambulatory Visit | Attending: Family Medicine | Admitting: Family Medicine

## 2016-10-11 ENCOUNTER — Other Ambulatory Visit: Payer: Self-pay | Admitting: Family Medicine

## 2016-10-11 DIAGNOSIS — I071 Rheumatic tricuspid insufficiency: Secondary | ICD-10-CM | POA: Diagnosis not present

## 2016-10-11 DIAGNOSIS — R600 Localized edema: Secondary | ICD-10-CM | POA: Insufficient documentation

## 2016-10-11 DIAGNOSIS — R0600 Dyspnea, unspecified: Secondary | ICD-10-CM | POA: Insufficient documentation

## 2016-10-11 LAB — HEPATITIS PANEL, ACUTE
HCV AB: NEGATIVE
HEP B C IGM: NONREACTIVE
HEP B S AG: NEGATIVE
Hep A IgM: NONREACTIVE

## 2016-10-11 MED ORDER — CANAGLIFLOZIN 100 MG PO TABS: 100.0000 mg | ORAL_TABLET | Freq: Every day | ORAL | 1 refills | Status: DC

## 2016-10-11 NOTE — Patient Instructions (Signed)
Diabetes mellitus:  Hemoglobin a1C increased to 9.0. Will start a trial of Invokana 100 mg with breakfast  Hypertension:  Above goal on current medication regimen. Will recheck blood pressure in 1 week   Bilateral lower extremity edema:  Will check a 2 D echogram Will follow up after lab tests Recommend daily weights Watch salt intake

## 2016-10-11 NOTE — Progress Notes (Signed)
  Echocardiogram 2D Echocardiogram has been performed.  Donata Clay 10/11/2016, 12:41 PM

## 2016-10-12 ENCOUNTER — Other Ambulatory Visit: Payer: Self-pay | Admitting: Family Medicine

## 2016-10-12 DIAGNOSIS — R748 Abnormal levels of other serum enzymes: Secondary | ICD-10-CM

## 2016-10-12 DIAGNOSIS — I519 Heart disease, unspecified: Secondary | ICD-10-CM

## 2016-10-12 DIAGNOSIS — R600 Localized edema: Secondary | ICD-10-CM

## 2016-10-12 DIAGNOSIS — I10 Essential (primary) hypertension: Secondary | ICD-10-CM

## 2016-10-12 MED ORDER — FUROSEMIDE 20 MG PO TABS: 20.0000 mg | ORAL_TABLET | Freq: Two times a day (BID) | ORAL | 1 refills | Status: DC

## 2016-10-12 MED ORDER — LISINOPRIL 20 MG PO TABS: 20.0000 mg | ORAL_TABLET | Freq: Every day | ORAL | 11 refills | Status: DC

## 2016-10-12 NOTE — Progress Notes (Signed)
[name], a 66 year old female with a history of uncontrolled diabetes mellitus, hypertension, and left breast cancer presented on 5/18 with dyspnea and bilateral lower extremity edema. Sent patient for 2 D echocardiagram on 9/74/1638, grade 1 diastolic dysfunction noted.  Will start a trial of furosemide 20 mg BID. Will also discontinue hydrochlorthiazide 25 mg. Will continue Lisinopril 20 mg as previously prescribed.   Diabetes mellitus type 2, will also add Invokana to anti-diabetic regimen.   Patient will follow up on 10/15/2016 for medication management.     Donia Pounds  MSN, FNP-C [facility] [address] [place], [place] [address] [phone]

## 2016-10-15 ENCOUNTER — Encounter: Payer: Self-pay | Admitting: Family Medicine

## 2016-10-15 ENCOUNTER — Ambulatory Visit (INDEPENDENT_AMBULATORY_CARE_PROVIDER_SITE_OTHER): Payer: Medicare Other | Admitting: Family Medicine

## 2016-10-15 VITALS — BP 133/62 | HR 75 | Temp 97.9°F | Resp 16 | Ht 60.25 in | Wt 242.0 lb

## 2016-10-15 DIAGNOSIS — E1165 Type 2 diabetes mellitus with hyperglycemia: Secondary | ICD-10-CM | POA: Diagnosis not present

## 2016-10-15 DIAGNOSIS — IMO0001 Reserved for inherently not codable concepts without codable children: Secondary | ICD-10-CM

## 2016-10-15 DIAGNOSIS — I739 Peripheral vascular disease, unspecified: Secondary | ICD-10-CM | POA: Diagnosis not present

## 2016-10-15 DIAGNOSIS — I83893 Varicose veins of bilateral lower extremities with other complications: Secondary | ICD-10-CM | POA: Diagnosis not present

## 2016-10-15 DIAGNOSIS — I519 Heart disease, unspecified: Secondary | ICD-10-CM

## 2016-10-15 DIAGNOSIS — I5189 Other ill-defined heart diseases: Secondary | ICD-10-CM

## 2016-10-15 LAB — POCT URINALYSIS DIP (DEVICE)
BILIRUBIN URINE: NEGATIVE
GLUCOSE, UA: NEGATIVE mg/dL
Hgb urine dipstick: NEGATIVE
Ketones, ur: NEGATIVE mg/dL
Leukocytes, UA: NEGATIVE
Nitrite: NEGATIVE
Protein, ur: NEGATIVE mg/dL
Specific Gravity, Urine: 1.025 (ref 1.005–1.030)
Urobilinogen, UA: 0.2 mg/dL (ref 0.0–1.0)
pH: 5 (ref 5.0–8.0)

## 2016-10-15 NOTE — Patient Instructions (Addendum)
Resume all medications as previously prescribed Urine negative: No glucosuria Recommend a lowfat, low carbohydrate diet divided over 5-6 small meals, increase water intake to 6-8 glasses, and 150 minutes per week of cardiovascular exercise.  The patient is asked to make an attempt to improve diet and exercise patterns to aid in medical management of this problem.  Daily weights:  Keep a diary of weight.   Discussed echocardiogram at length.    Will not start Invokana at this time. Will hold medication.    Will follow up by phone after abdominal ultrasound.    Peripheral Vascular Disease Peripheral vascular disease (PVD) is a disease of the blood vessels that are not part of your heart and brain. A simple term for PVD is poor circulation. In most cases, PVD narrows the blood vessels that carry blood from your heart to the rest of your body. This can result in a decreased supply of blood to your arms, legs, and internal organs, like your stomach or kidneys. However, it most often affects a person's lower legs and feet. There are two types of PVD.  Organic PVD. This is the more common type. It is caused by damage to the structure of blood vessels.  Functional PVD. This is caused by conditions that make blood vessels contract and tighten (spasm). Without treatment, PVD tends to get worse over time. PVD can also lead to acute ischemic limb. This is when an arm or limb suddenly has trouble getting enough blood. This is a medical emergency. Follow these instructions at home:  Take medicines only as told by your doctor.  Do not use any tobacco products, including cigarettes, chewing tobacco, or electronic cigarettes. If you need help quitting, ask your doctor.  Lose weight if you are overweight, and maintain a healthy weight as told by your doctor.  Eat a diet that is low in fat and cholesterol. If you need help, ask your doctor.  Exercise regularly. Ask your doctor for some good  activities for you.  Take good care of your feet.  Wear comfortable shoes that fit well.  Check your feet often for any cuts or sores. Contact a doctor if:  You have cramps in your legs while walking.  You have leg pain when you are at rest.  You have coldness in a leg or foot.  Your skin changes.  You are unable to get or have an erection (erectile dysfunction).  You have cuts or sores on your feet that are not healing. Get help right away if:  Your arm or leg turns cold and blue.  Your arms or legs become red, warm, swollen, painful, or numb.  You have chest pain or trouble breathing.  You suddenly have weakness in your face, arm, or leg.  You become very confused or you cannot speak.  You suddenly have a very bad headache.  You suddenly cannot see. This information is not intended to replace advice given to you by your health care provider. Make sure you discuss any questions you have with your health care provider. Document Released: 08/04/2009 Document Revised: 10/16/2015 Document Reviewed: 10/18/2013 Elsevier Interactive Patient Education  2017 [employer].

## 2016-10-15 NOTE — Progress Notes (Signed)
Subjective:    Patient ID: Erin House, female    DOB: [birth year], 66 y.o.   MRN: [id number]  HPI   [name], a 66 year old female with a history of type 2 diabetes mellitus, left breast cancer,  hypertension, and morbid obesity presents for a follow up of chronic conditions. Patient was in office on 10/06/2016 complaining of occasional dyspnea and bilateral lower extremity edema. She had an echocardiogram on 10/11/2016 to rule out heart failure. Patient was found to have grade 1 diastolic dysfunction due to left ventricular relaxation. Hydrochlorothiazide was discontinued. A trial of furosemide was started. Lisinopril 20 mg was continued. She has not started medications as instructed. She continues to have lower extremity edema. She complains of heaviness and fatigue in legs at the end of the day. She continues to have discoloration of bilateral lower extremities and varicose veins. She also has a history of PVD.   She has a history type 2 diabetes mellitus. She says that she has been taking medication consistently, but has not been following a lowfat, low carbohydrate diet. She says that her appetite has increase over the past several months. She endorses weight gain. She denies fatigue, dysuria, polyuria, abdominal pain, polydipsia, polyphagia, nausea, vomiting, or diarrhea. She says that she has been doing water aerobics 2-3 days per week at the [facility].   Past Medical History:  Diagnosis Date  . Arthritis    knees  . Cancer ([place]) 2000   left breast  . Diabetes mellitus without complication ([place])   . Hyperlipemia   . Hypertension   . Sleep apnea    has CPAP but does not wear every night   Social History   Social History  . Marital status: Married    Spouse name: N/A  . Number of children: N/A  . Years of education: N/A   Occupational History  . Not on file.   Social History Main Topics  . Smoking status: Former Smoker    Quit date: 05/24/1993  . Smokeless tobacco:  Never Used  . Alcohol use 0.0 oz/week     Comment: occasionlly  . Drug use: No  . Sexual activity: Not on file   Other Topics Concern  . Not on file   Social History Narrative  . No narrative on file   Immunization History  Administered Date(s) Administered  . Influenza,inj,Quad PF,36+ Mos 02/18/2014, 01/30/2015, 02/20/2016  . Pneumococcal Conjugate-13 08/15/2015  . Pneumococcal Polysaccharide-23 04/11/2014  . Tdap 07/15/2014  . Zoster 04/11/2014   Review of Systems  Constitutional: Positive for fatigue and unexpected weight change (weight gain).  HENT: Negative for ear pain and sore throat.   Eyes: Negative for visual disturbance.  Respiratory: Negative for wheezing.   Cardiovascular: Positive for leg swelling.  Gastrointestinal: Negative.  Negative for abdominal pain, diarrhea, nausea and vomiting.  Endocrine: Negative.  Negative for polydipsia, polyphagia and polyuria.  Genitourinary: Negative.  Negative for dysuria.  Skin: Negative for rash.  Hematological: Negative.   Psychiatric/Behavioral: Negative.        Objective:   Physical Exam  Constitutional: She is oriented to person, place, and time. She appears well-developed and well-nourished.  Morbid obesity  HENT:  Head: Normocephalic and atraumatic.  Right Ear: External ear normal.  Eyes: Conjunctivae and EOM are normal. Pupils are equal, round, and reactive to light.  Neck: Normal range of motion. Neck supple.  Cardiovascular: Normal rate, regular rhythm, normal heart sounds and intact distal pulses.   No murmur  heard. Pulses:      Dorsalis pedis pulses are 1+ on the right side, and 1+ on the left side.       Posterior tibial pulses are 1+ on the right side, and 1+ on the left side.  2+ pitting edema to lower extremities, cool to touch, hyperpigmentation.   Pulmonary/Chest: No respiratory distress. She has no wheezes.  Abdominal: Soft. Bowel sounds are normal.  Increased abdominal girth  Neurological: She is  alert and oriented to person, place, and time. She has normal reflexes.  Skin: Skin is warm and dry.  Varicose veins to lower extremites.     Hyperpigmentation to right foot, cool to touch.   Psychiatric: She has a normal mood and affect. Her behavior is normal. Judgment and thought content normal.      BP 133/62 (BP Location: Right Arm, Patient Position: Sitting, Cuff Size: Large)   Pulse 75   Temp 97.9 F (36.6 C) (Oral)   Resp 16   Ht 5' 0.25" (1.53 m)   Wt 242 lb (109.8 kg)   SpO2 99%   BMI 46.87 kg/m  Assessment & Plan:  1. PVD (peripheral vascular disease) with claudication ([place]) I suspect that PVD has worsened due to uncontrolled DMII and morbid obesity. Discussed the importance of a carbohydrate modified diet and exercise plan. Patient warrants further evaluation of PVD with claudication due to lower extremity edema, pain and discoloration.  - Ambulatory referral to Vascular Surgery  2. Varicose veins of both legs with edema - Ambulatory referral to Vascular Surgery  3. Uncontrolled type 2 diabetes mellitus without complication, without long-term current use of insulin ([place]) Patient has improved diet, she has started a carbohydrate modified diet. Discussed hemoglobin a1c at length. Will not start Invokana at this point. Glucosuria has improved. Patient's A1c has increased from 7.5 to 9.0. Discussed starting insulin with patient at length. She is adamant about starting insulin at this time. She was given written materials. I will continue Janumet and diet modifications.  - POCT urinalysis dip (device) - Hemoglobin A1c; Future - Basic Metabolic Panel; Future  4. 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      RTC: 2 weeks for labs and BP check    [place]  MSN, FNP-C [facility] [address] [place], [place] [address] [phone]

## 2016-10-18 DIAGNOSIS — I5189 Other ill-defined heart diseases: Secondary | ICD-10-CM | POA: Insufficient documentation

## 2016-10-18 DIAGNOSIS — I739 Peripheral vascular disease, unspecified: Secondary | ICD-10-CM | POA: Insufficient documentation

## 2016-10-18 DIAGNOSIS — I83893 Varicose veins of bilateral lower extremities with other complications: Secondary | ICD-10-CM | POA: Insufficient documentation

## 2016-10-19 ENCOUNTER — Other Ambulatory Visit: Payer: Self-pay | Admitting: Family Medicine

## 2016-10-19 ENCOUNTER — Ambulatory Visit (HOSPITAL_COMMUNITY): Admission: RE | Admit: 2016-10-19 | Payer: Medicare Other | Source: Ambulatory Visit

## 2016-10-27 ENCOUNTER — Ambulatory Visit (HOSPITAL_COMMUNITY)
Admission: RE | Admit: 2016-10-27 | Discharge: 2016-10-27 | Disposition: A | Discharge status: Home / Self Care | Payer: Medicare Other | Source: Ambulatory Visit | Attending: Family Medicine | Admitting: Family Medicine

## 2016-10-27 DIAGNOSIS — R748 Abnormal levels of other serum enzymes: Secondary | ICD-10-CM | POA: Diagnosis not present

## 2016-10-27 DIAGNOSIS — Z9049 Acquired absence of other specified parts of digestive tract: Secondary | ICD-10-CM | POA: Diagnosis not present

## 2016-10-29 ENCOUNTER — Encounter: Payer: Self-pay | Admitting: Family Medicine

## 2016-10-29 ENCOUNTER — Ambulatory Visit (INDEPENDENT_AMBULATORY_CARE_PROVIDER_SITE_OTHER): Payer: Medicare Other | Admitting: Family Medicine

## 2016-10-29 ENCOUNTER — Other Ambulatory Visit: Payer: Self-pay | Admitting: Family Medicine

## 2016-10-29 VITALS — BP 128/52 | HR 70 | Temp 98.2°F | Resp 16 | Ht 60.25 in | Wt 246.0 lb

## 2016-10-29 DIAGNOSIS — E1165 Type 2 diabetes mellitus with hyperglycemia: Secondary | ICD-10-CM | POA: Diagnosis not present

## 2016-10-29 DIAGNOSIS — K760 Fatty (change of) liver, not elsewhere classified: Secondary | ICD-10-CM | POA: Diagnosis not present

## 2016-10-29 DIAGNOSIS — IMO0001 Reserved for inherently not codable concepts without codable children: Secondary | ICD-10-CM

## 2016-10-29 LAB — POCT GLYCOSYLATED HEMOGLOBIN (HGB A1C): Hemoglobin A1C: 9

## 2016-10-29 MED ORDER — ACCU-CHEK AVIVA PLUS W/DEVICE KIT: 1.0000 | PACK | Freq: Every day | 0 refills | Status: AC

## 2016-10-29 MED ORDER — LIRAGLUTIDE 18 MG/3ML ~~LOC~~ SOPN: PEN_INJECTOR | SUBCUTANEOUS | 12 refills | Status: DC

## 2016-10-29 MED ORDER — GLUCOSE BLOOD VI STRP: ORAL_STRIP | 12 refills | Status: AC

## 2016-10-29 MED ORDER — GLIPIZIDE 5 MG PO TABS: 5.0000 mg | ORAL_TABLET | Freq: Two times a day (BID) | ORAL | 3 refills | Status: DC

## 2016-10-29 NOTE — Patient Instructions (Addendum)
Nonalcoholic Fatty Liver Disease Diet Nonalcoholic fatty liver disease is a condition that causes fat to accumulate in and around the liver. The disease makes it harder for the liver to work the way that it should. Following a healthy diet can help to keep nonalcoholic fatty liver disease under control. It can also help to prevent or improve conditions that are associated with the disease, such as heart disease, diabetes, high blood pressure, and abnormal cholesterol levels. Along with regular exercise, this diet:  Promotes weight loss.  Helps to control blood sugar levels.  Helps to improve the way that the body uses insulin.  What do I need to know about this diet?  Use the glycemic index (GI) to plan your meals. The index tells you how quickly a food will raise your blood sugar. Choose low-GI foods. These foods take a longer time to raise blood sugar.  Keep track of how many calories you take in. Eating the right amount of calories will help you to achieve a healthy weight.  You may want to follow a Mediterranean diet. This diet includes a lot of vegetables, lean meats or fish, whole grains, fruits, and healthy oils and fats. What foods can I eat? Grains Whole grains, such as whole-wheat or whole-grain breads, crackers, tortillas, cereals, and pasta. Stone-ground whole wheat. Pumpernickel bread. Unsweetened oatmeal. Bulgur. Barley. Quinoa. Brown or wild rice. Corn or whole-wheat flour tortillas. Vegetables Lettuce. Spinach. Peas. Beets. Cauliflower. Cabbage. Broccoli. Carrots. Tomatoes. Squash. Eggplant. Herbs. Peppers. Onions. Cucumbers. Brussels sprouts. Yams and sweet potatoes. Beans. Lentils. Fruits Bananas. Apples. Oranges. Grapes. Papaya. Mango. Pomegranate. Kiwi. Grapefruit. Cherries. Meats and Other Protein Sources Seafood and shellfish. Lean meats. Poultry. Tofu. Dairy Low-fat or fat-free dairy products, such as yogurt, cottage cheese, and cheese. Beverages Water. Sugar-free  drinks. Tea. Coffee. Low-fat or skim milk. Milk alternatives, such as soy or almond milk. Real fruit juice. Condiments Mustard. Relish. Low-fat, low-sugar ketchup and barbecue sauce. Low-fat or fat-free mayonnaise. Sweets and Desserts Sugar-free sweets. Fats and Oils Avocado. Canola or olive oil. Nuts and nut butters. Seeds. The items listed above may not be a complete list of recommended foods or beverages. Contact your dietitian for more options. What foods are not recommended? Palm oil and coconut oil. Processed foods. Fried foods. Sweetened drinks, such as sweet tea, milkshakes, snow cones, iced sweet drinks, and sodas. Alcohol. Sweets. Foods that contain a lot of salt or sodium. The items listed above may not be a complete list of foods and beverages to avoid. Contact your dietitian for more information. This information is not intended to replace advice given to you by your health care provider. Make sure you discuss any questions you have with your health care provider. Document Released: 09/24/2014 Document Revised: 10/16/2015 Document Reviewed: 06/04/2014 Elsevier Interactive Patient Education  2018 [employer]  Fatty Liver Fatty liver, also called hepatic steatosis or steatohepatitis, is a condition in which too much fat has built up in your liver cells. The liver removes harmful substances from your bloodstream. It produces fluids your body needs. It also helps your body use and store energy from the food you eat. In many cases, fatty liver does not cause symptoms or problems. It is often diagnosed when tests are being done for other reasons. However, over time, fatty liver can cause inflammation that may lead to more serious liver problems, such as scarring of the liver (cirrhosis). What are the causes? Causes of fatty liver may include:  Drinking too much alcohol.  Poor   nutrition.  Obesity.  Cushing syndrome.  Diabetes.  Hyperlipidemia.  Pregnancy.  Certain  drugs.  Poisons.  Some viral infections.  What increases the risk? You may be more likely to develop fatty liver if you:  Abuse alcohol.  Are pregnant.  Are overweight.  Have diabetes.  Have hepatitis.  Have a high triglyceride level.  What are the signs or symptoms? Fatty liver often does not cause any symptoms. In cases where symptoms develop, they can include:  Fatigue.  Weakness.  Weight loss.  Confusion.  Abdominal pain.  Yellowing of your skin and the white parts of your eyes (jaundice).  Nausea and vomiting.  How is this diagnosed? Fatty liver may be diagnosed by:  Physical exam and medical history.  Blood tests.  Imaging tests, such as an ultrasound, CT scan, or MRI.  Liver biopsy. A small sample of liver tissue is removed using a needle. The sample is then looked at under a microscope.  How is this treated? Fatty liver is often caused by other health conditions. Treatment for fatty liver may involve medicines and lifestyle changes to manage conditions such as:  Alcoholism.  High cholesterol.  Diabetes.  Being overweight or obese.  Follow these instructions at home:  Eat a healthy diet as directed by your health care provider.  Exercise regularly. This can help you lose weight and control your cholesterol and diabetes. Talk to your health care provider about an exercise plan and which activities are best for you.  Do not drink alcohol.  Take medicines only as directed by your health care provider. Contact a health care provider if: You have difficulty controlling your:  Blood sugar.  Cholesterol.  Alcohol consumption.  Get help right away if:  You have abdominal pain.  You have jaundice.  You have nausea and vomiting. This information is not intended to replace advice given to you by your health care provider. Make sure you discuss any questions you have with your health care provider. Document Released: 06/25/2005 Document  Revised: 10/16/2015 Document Reviewed: 09/19/2013 Elsevier Interactive Patient Education  2018 [employer]  

## 2016-10-29 NOTE — Progress Notes (Signed)
Subjective:    Patient ID: Erin House, female    DOB: 1950-11-15, 66 y.o.   MRN: [id number]  HPI   [name], a 66 year old female with a history of type 2 diabetes mellitus, left breast cancer,  hypertension, and morbid obesity presents for a follow up of chronic conditions. Patient was in office on 10/06/2016 complaining of occasional dyspnea and bilateral lower extremity edema. She had an echocardiogram on 10/11/2016 to rule out heart failure. Patient was found to have grade 1 diastolic dysfunction due to left ventricular relaxation. Hydrochlorothiazide was discontinued. A trial of furosemide was started. Lisinopril 20 mg was continued. She has not started medications as instructed. She continues to have lower extremity edema. She complains of heaviness and fatigue in legs at the end of the day. She continues to have discoloration of bilateral lower extremities and varicose veins. She also has a history of PVD.   [name] was also found to have consistently elevated liver enzymes. She had a negative hepatitis panel and drinks alcohol occasionally. I sent patient for an abdominal ultrasound. She was found to have an echogenic liver consistent with fatty infiltration. She is morbidly obese and weight continues to increase. Her current body mass index is 47.65. She typically travels and eats a high fat, high cholesterol diet. She has a history type 2 diabetes mellitus. She says that she has been taking medication consistently, She says that her appetite has increased over the past several months. She endorses weight gain. She denies fatigue, dysuria, polyuria, abdominal pain, polydipsia, polyphagia, nausea, vomiting, or diarrhea. She says that she has been consistently attending water aerobics 2-3 days per week at the [facility].   Past Medical History:  Diagnosis Date  . Arthritis    knees  . Cancer ([place]) 2000   left breast  . Diabetes mellitus without complication ([place])   .  Hyperlipemia   . Hypertension   . Sleep apnea    has CPAP but does not wear every night   Social History   Social History  . Marital status: Married    Spouse name: N/A  . Number of children: N/A  . Years of education: N/A   Occupational History  . Not on file.   Social History Main Topics  . Smoking status: Former Smoker    Quit date: 05/24/1993  . Smokeless tobacco: Never Used  . Alcohol use 0.0 oz/week     Comment: occasionlly  . Drug use: No  . Sexual activity: Not on file   Other Topics Concern  . Not on file   Social History Narrative  . No narrative on file   Immunization History  Administered Date(s) Administered  . Influenza,inj,Quad PF,36+ Mos 02/18/2014, 01/30/2015, 02/20/2016  . Pneumococcal Conjugate-13 08/15/2015  . Pneumococcal Polysaccharide-23 04/11/2014  . Tdap 07/15/2014  . Zoster 04/11/2014   Review of Systems  Constitutional: Positive for unexpected weight change (weight gain).  HENT: Negative for ear pain.   Eyes: Negative for visual disturbance.  Respiratory: Negative for wheezing.   Cardiovascular: Positive for leg swelling.  Gastrointestinal: Negative.  Negative for diarrhea.  Endocrine: Negative.  Negative for polydipsia, polyphagia and polyuria.  Genitourinary: Negative.  Negative for dysuria.  Hematological: Negative.   Psychiatric/Behavioral: Negative.        Objective:   Physical Exam  Constitutional: She is oriented to person, place, and time. She appears well-developed and well-nourished.  Morbid obesity  HENT:  Head: Normocephalic and atraumatic.  Right Ear:  External ear normal.  Eyes: Conjunctivae and EOM are normal. Pupils are equal, round, and reactive to light.  Neck: Normal range of motion. Neck supple.  Cardiovascular: Normal rate, regular rhythm, normal heart sounds and intact distal pulses.   No murmur heard. Pulses:      Dorsalis pedis pulses are 1+ on the right side, and 1+ on the left side.       Posterior  tibial pulses are 1+ on the right side, and 1+ on the left side.  2+ pitting edema to lower extremities, cool to touch, hyperpigmentation.   Pulmonary/Chest: No respiratory distress. She has no wheezes.  Abdominal: Soft. Bowel sounds are normal.  Increased abdominal girth  Neurological: She is alert and oriented to person, place, and time. She has normal reflexes.  Skin: Skin is warm and dry.  Varicose veins to lower extremites.     Hyperpigmentation to right foot, cool to touch.   Psychiatric: She has a normal mood and affect. Her behavior is normal. Judgment and thought content normal.      BP (!) 128/52 (BP Location: Left Arm, Patient Position: Sitting, Cuff Size: Large)   Pulse 70   Temp 98.2 F (36.8 C) (Oral)   Resp 16   Ht 5' 0.25" (1.53 m)   Wt 246 lb (111.6 kg)   SpO2 100%   BMI 47.65 kg/m  Assessment & Plan:  1. Uncontrolled type 2 diabetes mellitus without complication, without long-term current use of insulin ([place]) Will add liraglutide to medication regimen for greater diabetes control. There have been some probable compliance issues here. I have discussed with her the great importance of following the treatment plan exactly as directed in order to achieve a good medical outcome.  - Blood Glucose Monitoring Suppl (ACCU-CHEK AVIVA PLUS) w/Device KIT; 1 each by Does not apply route daily.  Dispense: 1 kit; Refill: 0 - glucose blood (ACCU-CHEK AVIVA) test strip; Will check fasting blood sugar daily. Use as instructed  Dispense: 100 each; Refill: 12 - HgB A1c - glipiZIDE (GLUCOTROL) 5 MG tablet; Take 1 tablet (5 mg total) by mouth 2 (two) times daily before a meal.  Dispense: 60 tablet; Refill: 3 - liraglutide 18 MG/3ML SOPN; 0.6 mg daily for 1 week. Week 2, increase to 1.2 mg daily  Dispense: 3 mL; Refill: 12 - Ambulatory referral to Endocrinology  2. Non-alcoholic fatty liver disease Recommend a lowfat, low carbohydrate diet divided over 5-6 small meals, increase water  intake to 6-8 glasses, and 150 minutes per week of cardiovascular exercise.  The 10-year ASCVD risk score Erin House DC Erin House., et al., 2013) is: 18%   Values used to calculate the score:     Age: 51 years     Sex: Female     Is Non-Hispanic African American: Yes     Diabetic: Yes     Tobacco smoker: No     Systolic Blood Pressure: 751 mmHg     Is BP treated: Yes     HDL Cholesterol: 46 mg/dL     Total Cholesterol: 141 mg/dL  3. Obesity, morbid ([place]) The patient is asked to make an attempt to improve diet and exercise patterns to aid in medical management of this problem. Continue exercise regimen at the [facility] 3 days per week.     RTC: Follow up in 1 month for DMII    Carrel Leather Al Decant  MSN, FNP-C [facility] [place], [place] [address] [phone]

## 2016-11-01 ENCOUNTER — Other Ambulatory Visit: Payer: Self-pay | Admitting: Family Medicine

## 2016-11-09 ENCOUNTER — Ambulatory Visit: Payer: Medicare Other | Admitting: Family Medicine

## 2016-11-26 ENCOUNTER — Ambulatory Visit: Payer: Medicare Other | Admitting: Family Medicine

## 2016-12-14 ENCOUNTER — Other Ambulatory Visit: Payer: Self-pay

## 2016-12-14 DIAGNOSIS — I83893 Varicose veins of bilateral lower extremities with other complications: Secondary | ICD-10-CM

## 2016-12-17 ENCOUNTER — Ambulatory Visit (INDEPENDENT_AMBULATORY_CARE_PROVIDER_SITE_OTHER): Payer: Medicare Other | Admitting: Family Medicine

## 2016-12-17 ENCOUNTER — Encounter: Payer: Self-pay | Admitting: Family Medicine

## 2016-12-17 VITALS — BP 125/53 | HR 64 | Temp 97.9°F | Resp 16 | Ht 60.25 in | Wt 240.0 lb

## 2016-12-17 DIAGNOSIS — E1165 Type 2 diabetes mellitus with hyperglycemia: Secondary | ICD-10-CM | POA: Diagnosis not present

## 2016-12-17 DIAGNOSIS — R600 Localized edema: Secondary | ICD-10-CM | POA: Diagnosis not present

## 2016-12-17 DIAGNOSIS — I519 Heart disease, unspecified: Secondary | ICD-10-CM

## 2016-12-17 DIAGNOSIS — IMO0001 Reserved for inherently not codable concepts without codable children: Secondary | ICD-10-CM

## 2016-12-17 LAB — POCT GLYCOSYLATED HEMOGLOBIN (HGB A1C): Hemoglobin A1C: 7.4

## 2016-12-17 MED ORDER — METFORMIN HCL 1000 MG PO TABS: 1000.0000 mg | ORAL_TABLET | Freq: Two times a day (BID) | ORAL | 3 refills | Status: DC

## 2016-12-17 MED ORDER — FUROSEMIDE 20 MG PO TABS: 20.0000 mg | ORAL_TABLET | Freq: Every day | ORAL | 1 refills | Status: DC

## 2016-12-17 NOTE — Progress Notes (Signed)
Subjective:    Patient ID: Erin House, female    DOB: 04/02/1951, 66 y.o.   MRN: [id number]  HPI   Erin House, a 66 year old female with a history of type 2 diabetes mellitus, left breast cancer,  hypertension, and morbid obesity presents for a follow up of chronic conditions.  She had an echocardiogram on 10/11/2016 to rule out heart failure. Patient was found to have grade 1 diastolic dysfunction due to left ventricular relaxation.  She continues to complain of  lower extremity edema. She complains of heaviness and fatigue in legs at the end of the day. She continues to have discoloration of bilateral lower extremities and varicose veins. She also has a history of PVD.   Erin House was also found to have consistently elevated liver enzymes. She had a negative hepatitis panel and drinks alcohol occasionally.  She was found to have an echogenic liver consistent with fatty infiltration on abdominal ultrasound. She has been following a low fat, low carbohydrate diet over the past month. She was also referred to endocrinology. She has lost a total of 6 pounds over the past month.  She has been taking medication consistently.  She denies fatigue, dysuria, polyuria, abdominal pain, polydipsia, polyphagia, nausea, vomiting, or diarrhea. She says that she has been consistently attending water aerobics 2-3 days per week at the [facility].   Past Medical History:  Diagnosis Date  . Arthritis    knees  . Cancer ([place]) 2000   left breast  . Diabetes mellitus without complication ([place])   . Hyperlipemia   . Hypertension   . Sleep apnea    has CPAP but does not wear every night   Social History   Social History  . Marital status: Married    Spouse name: N/A  . Number of children: N/A  . Years of education: N/A   Occupational History  . Not on file.   Social History Main Topics  . Smoking status: Former Smoker    Quit date: 05/24/1993  . Smokeless tobacco: Never Used  . Alcohol use  0.0 oz/week     Comment: occasionlly  . Drug use: No  . Sexual activity: Not on file   Other Topics Concern  . Not on file   Social History Narrative  . No narrative on file   Immunization History  Administered Date(s) Administered  . Influenza,inj,Quad PF,36+ Mos 02/18/2014, 01/30/2015, 02/20/2016  . Pneumococcal Conjugate-13 08/15/2015  . Pneumococcal Polysaccharide-23 04/11/2014  . Tdap 07/15/2014  . Zoster 04/11/2014   Review of Systems  Constitutional: Positive for unexpected weight change (weight gain).  HENT: Negative for ear pain.   Eyes: Negative for visual disturbance.  Respiratory: Negative for wheezing.   Cardiovascular: Positive for leg swelling.  Gastrointestinal: Negative.  Negative for diarrhea.  Endocrine: Negative.  Negative for polydipsia, polyphagia and polyuria.  Genitourinary: Negative.  Negative for dysuria.  Hematological: Negative.   Psychiatric/Behavioral: Negative.        Objective:   Physical Exam  Constitutional: She is oriented to person, place, and time. She appears well-developed and well-nourished.  Morbid obesity  HENT:  Head: Normocephalic and atraumatic.  Right Ear: External ear normal.  Eyes: Pupils are equal, round, and reactive to light. Conjunctivae and EOM are normal.  Neck: Normal range of motion. Neck supple.  Cardiovascular: Normal rate, regular rhythm, normal heart sounds and intact distal pulses.   No murmur heard. Pulses:      Dorsalis pedis pulses are 1+  on the right side, and 1+ on the left side.       Posterior tibial pulses are 1+ on the right side, and 1+ on the left side.  2+ pitting edema to lower extremities, cool to touch, hyperpigmentation.   Pulmonary/Chest: No respiratory distress. She has no wheezes.  Abdominal: Soft. Bowel sounds are normal.  Increased abdominal girth  Neurological: She is alert and oriented to person, place, and time. She has normal reflexes.  Skin: Skin is warm and dry.  Varicose veins  to lower extremites.     Hyperpigmentation to right foot, cool to touch.   Psychiatric: She has a normal mood and affect. Her behavior is normal. Judgment and thought content normal.      BP (!) 125/53 (BP Location: Left Arm, Patient Position: Sitting, Cuff Size: Large)   Pulse 64   Temp 97.9 F (36.6 C) (Oral)   Resp 16   Ht 5' 0.25" (1.53 m)   Wt 240 lb (108.9 kg)   SpO2 99%   BMI 46.48 kg/m  Assessment & Plan:  1. Uncontrolled type 2 diabetes mellitus without complication, without long-term current use of insulin ([place]) Will continue liraglutide to medication regimen for greater diabetes control. Hemoglobin a1C has improved to 7.4 from 9.0. I will also discontinue Januvia (DPP4 inhibitor) due to adding GLP-1.  - HgB A1c - metFORMIN (GLUCOPHAGE) 1000 MG tablet; Take 1 tablet (1,000 mg total) by mouth 2 (two) times daily with a meal.  Dispense: 180 tablet; Refill: 3 - COMPLETE METABOLIC PANEL WITH GFR  2. Bilateral lower extremity edema Previously sent referral to vein and vascular for further evaluation. Will continue Furosemide 20 mg daily.  - furosemide (LASIX) 20 MG tablet; Take 1 tablet (20 mg total) by mouth daily.  Dispense: 30 tablet; Refill: 1   RTC: 2 months for chronic conditions    Donia Pounds  MSN, FNP-C [place] [address] [place], [place] [address] [phone]

## 2016-12-17 NOTE — Progress Notes (Signed)
hg

## 2016-12-18 LAB — COMPLETE METABOLIC PANEL WITH GFR
ALBUMIN: 3.8 g/dL (ref 3.6–5.1)
ALK PHOS: 53 U/L (ref 33–130)
ALT: 22 U/L (ref 6–29)
AST: 18 U/L (ref 10–35)
BILIRUBIN TOTAL: 0.7 mg/dL (ref 0.2–1.2)
BUN: 12 mg/dL (ref 7–25)
CALCIUM: 9.1 mg/dL (ref 8.6–10.4)
CHLORIDE: 102 mmol/L (ref 98–110)
CO2: 26 mmol/L (ref 20–31)
CREATININE: 0.69 mg/dL (ref 0.50–0.99)
GFR, Est Non African American: >89 mL/min (ref >=60)
Glucose, Bld: 96 mg/dL (ref 65–99)
Potassium: 4.1 mmol/L (ref 3.5–5.3)
Sodium: 141 mmol/L (ref 135–146)
Total Protein: 6.2 g/dL (ref 6.1–8.1)

## 2016-12-20 ENCOUNTER — Other Ambulatory Visit: Payer: Self-pay

## 2016-12-20 ENCOUNTER — Telehealth: Payer: Self-pay

## 2016-12-20 DIAGNOSIS — I10 Essential (primary) hypertension: Secondary | ICD-10-CM

## 2016-12-20 MED ORDER — LISINOPRIL 20 MG PO TABS: 20.0000 mg | ORAL_TABLET | Freq: Every day | ORAL | 11 refills | Status: DC

## 2016-12-20 NOTE — Telephone Encounter (Signed)
Refill for lisinopril sent into pharmacy. Thanks!  

## 2016-12-20 NOTE — Telephone Encounter (Signed)
-----   Message from Dorena Dew, [place] sent at 12/19/2016  4:03 PM EDT ----- Regarding: lab results Please inform Ms. Loughner that liver enzymes have improved. Recommend a lowfat, low carbohydrate diet divided over 5-6 small meals, increase water intake to 6-8 glasses, and 150 minutes per week of cardiovascular exercise.   Follow up in office as scheduled.   Thanks ----- Message ----- From: Adelina Mings, LPN Sent: 08/06/1759  10:43 AM To: Dorena Dew, FNP

## 2016-12-20 NOTE — Telephone Encounter (Signed)
Called, no answer. Left message advising that liver enzymes have improved. Recommened that patient follow a low fat/ low carb diet over 5 to 6 small meals daily, advised to drink 6 to 8 glasses of water daily and exercise 150 minutes weekly. Asked that patient keep next scheduled appointment and call back if any questions. Thanks!

## 2016-12-20 NOTE — Telephone Encounter (Signed)
Patient states she didn't receive rx for plain lisinopril. This has been refilled. Thanks!

## 2016-12-22 DIAGNOSIS — I872 Venous insufficiency (chronic) (peripheral): Secondary | ICD-10-CM

## 2016-12-22 HISTORY — DX: Venous insufficiency (chronic) (peripheral): I87.2

## 2017-01-14 ENCOUNTER — Other Ambulatory Visit: Payer: Self-pay | Admitting: Family Medicine

## 2017-01-14 ENCOUNTER — Other Ambulatory Visit: Payer: Medicare Other

## 2017-01-14 DIAGNOSIS — E1165 Type 2 diabetes mellitus with hyperglycemia: Principal | ICD-10-CM

## 2017-01-14 DIAGNOSIS — IMO0001 Reserved for inherently not codable concepts without codable children: Secondary | ICD-10-CM

## 2017-01-15 LAB — BASIC METABOLIC PANEL
BUN: 18 mg/dL (ref 7–25)
CALCIUM: 9.6 mg/dL (ref 8.6–10.4)
CO2: 24 mmol/L (ref 20–32)
Chloride: 103 mmol/L (ref 98–110)
Creat: 0.63 mg/dL (ref 0.50–0.99)
Glucose, Bld: 116 mg/dL — ABNORMAL HIGH (ref 65–99)
Potassium: 4.1 mmol/L (ref 3.5–5.3)
SODIUM: 140 mmol/L (ref 135–146)

## 2017-01-15 LAB — HEMOGLOBIN A1C
Hgb A1c MFr Bld: 6.7 % — ABNORMAL HIGH (ref <5.7)
MEAN PLASMA GLUCOSE: 146 mg/dL

## 2017-01-17 LAB — LIPID PANEL
CHOL/HDL RATIO: 3.3 ratio (ref <5.0)
Cholesterol: 157 mg/dL (ref <200)
HDL: 47 mg/dL — AB (ref >50)
LDL CALC: 76 mg/dL (ref <100)
Triglycerides: 169 mg/dL — ABNORMAL HIGH (ref <150)
VLDL: 34 mg/dL — AB (ref <30)

## 2017-01-20 ENCOUNTER — Encounter: Payer: Self-pay | Admitting: Vascular Surgery

## 2017-01-20 ENCOUNTER — Ambulatory Visit (HOSPITAL_COMMUNITY)
Admission: RE | Admit: 2017-01-20 | Discharge: 2017-01-20 | Disposition: A | Discharge status: Home / Self Care | Payer: Medicare Other | Source: Ambulatory Visit | Attending: Vascular Surgery | Admitting: Vascular Surgery

## 2017-01-20 ENCOUNTER — Ambulatory Visit (INDEPENDENT_AMBULATORY_CARE_PROVIDER_SITE_OTHER): Payer: Medicare Other | Admitting: Vascular Surgery

## 2017-01-20 VITALS — BP 131/71 | Temp 97.8°F | Ht 60.0 in | Wt 236.0 lb

## 2017-01-20 DIAGNOSIS — I872 Venous insufficiency (chronic) (peripheral): Secondary | ICD-10-CM | POA: Diagnosis not present

## 2017-01-20 DIAGNOSIS — I83893 Varicose veins of bilateral lower extremities with other complications: Secondary | ICD-10-CM | POA: Diagnosis not present

## 2017-01-20 NOTE — Progress Notes (Signed)
Patient ID: Erin House, female   DOB: 03-04-1951, 66 y.o.   MRN: [id number]  Reason for Consult: PVD (Referred by Cammie Sickle, NP eval bilateral vv with edema-PVD with claudication-leg pain more prominent on left left vs right )   Referred by Dorena Dew, FNP  Subjective:     HPI:  Erin House is a 66 y.o. female  with history of diabetes and hypertension and obesity. She presents today for bilateral lower extremity swelling. She does have a long history of this. Her weight is up and down over time. She's never had a DVT although her son has had multiple DVTs and is on blood thinners. She herself does not take any blood thinners and does not take aspirin either. She's never had ulceration of her lower extremities. She does have skin changes she is unsure how long this had been there only of the right leg. She does not have complaints of the appearance of the legs but that her legs feel heavy and easily tired. States the right is greater than left.  Past Medical History:  Diagnosis Date  . Arthritis    knees  . Cancer ([place]) 2000   left breast  . Diabetes mellitus without complication ([place])   . Hyperlipemia   . Hypertension   . Sleep apnea    has CPAP but does not wear every night   No family history on file. Past Surgical History:  Procedure Laterality Date  . BREAST BIOPSY Right 04/29/2014  . BREAST LUMPECTOMY WITH RADIOACTIVE SEED LOCALIZATION Right 06/11/2014   Procedure: BREAST LUMPECTOMY WITH RADIOACTIVE SEED LOCALIZATION;  Surgeon: Jackolyn Confer, MD;  Location: [facility];  Service: General;  Laterality: Right;  . CHOLECYSTECTOMY    . CYST ON WRIST     LEFT WRIST-BENIGN  . MASTECTOMY Left 05/1999  . ROTATOR CUFF REPAIR     LEFT SHOULDER  . WISDOM TOOTH EXTRACTION      Short Social History:  Social History  Substance Use Topics  . Smoking status: Former Smoker    Quit date: 05/24/1993  . Smokeless tobacco: Never Used  . Alcohol use  0.0 oz/week     Comment: occasionlly    No Known Allergies  Current Outpatient Prescriptions  Medication Sig Dispense Refill  . Blood Glucose Monitoring Suppl (ACCU-CHEK AVIVA PLUS) w/Device KIT 1 each by Does not apply route daily. 1 kit 0  . cetirizine (ZYRTEC) 10 MG tablet Take 1 tablet (10 mg total) by mouth daily. 30 tablet 11  . furosemide (LASIX) 20 MG tablet Take 1 tablet (20 mg total) by mouth daily. 30 tablet 1  . glucose blood (ACCU-CHEK AVIVA) test strip Will check fasting blood sugar daily. Use as instructed 100 each 12  . liraglutide 18 MG/3ML SOPN 0.6 mg daily for 1 week. Week 2, increase to 1.2 mg daily 3 mL 12  . lisinopril (PRINIVIL,ZESTRIL) 20 MG tablet Take 1 tablet (20 mg total) by mouth daily. 30 tablet 11  . metFORMIN (GLUCOPHAGE) 1000 MG tablet Take 1 tablet (1,000 mg total) by mouth 2 (two) times daily with a meal. 180 tablet 3  . rosuvastatin (CRESTOR) 10 MG tablet Take 1 tablet (10 mg total) by mouth daily. 90 tablet 1  . Vitamin D, Ergocalciferol, (DRISDOL) 50000 units CAPS capsule TAKE ONE CAPSULE BY MOUTH ONCE A WEEK 12 capsule 1   Current Facility-Administered Medications  Medication Dose Route Frequency Provider Last Rate Last Dose  . betamethasone acetate-betamethasone sodium phosphate (  CELESTONE) injection 3 mg  3 mg Intramuscular Once Edrick Kins, DPM        Review of Systems  Constitutional:  Constitutional negative. HENT: HENT negative.  Respiratory: Positive for shortness of breath.  Cardiovascular: Positive for leg swelling.  GI: Gastrointestinal negative.  Musculoskeletal: Musculoskeletal negative.  Skin: Skin negative.  Neurological: Neurological negative. Hematologic: Hematologic/lymphatic negative.  Psychiatric: Psychiatric negative.        Objective:  Objective   Vitals:   01/20/17 1507  BP: 131/71  Temp: 97.8 F (36.6 C)  TempSrc: Oral  SpO2: 98%  Weight: 236 lb (107 kg)  Height: 5' (1.524 m)   Body mass index is 46.09  kg/m.  Physical Exam  Constitutional: She is oriented to person, place, and time. She appears well-developed.  HENT:  Head: Normocephalic.  Eyes: Pupils are equal, round, and reactive to light.  Neck: Normal range of motion.  Cardiovascular: Normal rate.   Pulses:      Radial pulses are 2+ on the right side, and 2+ on the left side.       Dorsalis pedis pulses are 2+ on the right side, and 2+ on the left side.  Pulmonary/Chest: Effort normal.  Abdominal: Soft.  Musculoskeletal: Normal range of motion. She exhibits edema.  Neurological: She is alert and oriented to person, place, and time.  Skin: Skin is warm and dry.  Psychiatric: She has a normal mood and affect. Her behavior is normal. Judgment and thought content normal.    Data: I have independently interpreted her bilateral lower extremity venous duplex exam to have no evidence of thrombus with incompetence noted in bilateral common femoral and small saphenous veins. There is also incompetence noted at the right greater saphenous vein and saphenofemoral junction measuring 0.74 cm.  By my own ultrasound interpretation the saphenous vein is 0.43 cm at the level of the knee on the right.      Assessment/Plan:     66 year old female with evidence of C4 venous disease and right greater saphenous vein reflux on duplex today. By my own ultrasound this measures 0.43 cm at the level of the knee. I recommended her thigh-high compression stockings. Also had extensive discussion regarding weight loss and the possibility of weight loss surgery which she is not interested in. I also recommended she take aspirin given that she is a obese diabetic female although she does not have any significant PAD that I can tell with palpable dorsalis pedis pulses. I recommended follow-up in 3 months with either Dr. Kellie Simmering for Dr. Donnetta Hutching as I think she may benefit from saphenous vein ablation given the appearance of her skin on the right. I told her that they  may evaluate her vein and think it is too small for treatment of think it is worth the discussion. She demonstrates good understanding and will get thigh-high stockings for follow-up in 3 months.     Erin Sandy MD [facility]

## 2017-02-23 ENCOUNTER — Telehealth: Payer: Self-pay

## 2017-02-23 ENCOUNTER — Other Ambulatory Visit: Payer: Self-pay | Admitting: Family Medicine

## 2017-02-23 DIAGNOSIS — R600 Localized edema: Secondary | ICD-10-CM

## 2017-02-23 NOTE — Telephone Encounter (Signed)
This has been refilled and sent into pharmacy. Thanks!

## 2017-03-21 ENCOUNTER — Encounter: Payer: Self-pay | Admitting: Family Medicine

## 2017-03-21 ENCOUNTER — Ambulatory Visit (INDEPENDENT_AMBULATORY_CARE_PROVIDER_SITE_OTHER): Payer: Medicare Other | Admitting: Family Medicine

## 2017-03-21 VITALS — BP 135/64 | HR 82 | Temp 98.4°F | Resp 16 | Ht 60.25 in | Wt 232.0 lb

## 2017-03-21 DIAGNOSIS — Z8639 Personal history of other endocrine, nutritional and metabolic disease: Secondary | ICD-10-CM

## 2017-03-21 DIAGNOSIS — E1165 Type 2 diabetes mellitus with hyperglycemia: Secondary | ICD-10-CM | POA: Diagnosis not present

## 2017-03-21 DIAGNOSIS — I10 Essential (primary) hypertension: Secondary | ICD-10-CM

## 2017-03-21 DIAGNOSIS — R0989 Other specified symptoms and signs involving the circulatory and respiratory systems: Secondary | ICD-10-CM

## 2017-03-21 DIAGNOSIS — E785 Hyperlipidemia, unspecified: Secondary | ICD-10-CM

## 2017-03-21 LAB — POCT URINALYSIS DIP (DEVICE)
BILIRUBIN URINE: NEGATIVE
Glucose, UA: NEGATIVE mg/dL
HGB URINE DIPSTICK: NEGATIVE
KETONES UR: NEGATIVE mg/dL
LEUKOCYTES UA: NEGATIVE
Nitrite: NEGATIVE
PH: 6 (ref 5.0–8.0)
Protein, ur: NEGATIVE mg/dL
SPECIFIC GRAVITY, URINE: 1.015 (ref 1.005–1.030)
Urobilinogen, UA: 0.2 mg/dL (ref 0.0–1.0)

## 2017-03-21 LAB — POCT GLYCOSYLATED HEMOGLOBIN (HGB A1C): Hemoglobin A1C: 6.1

## 2017-03-21 NOTE — Progress Notes (Signed)
Erin House,  a 66 year old female, with a history of type 2 diabetes mellitus, hypertension, and left breast cancer presents for follow-up of chronic conditions.  Patient reports that she is doing well and is with minimal complaint.  Patient was recently referred to endocrinology for further treatment and evaluation of uncontrolled diabetes.  Patient has been doing well and hemoglobin A1c is at goal.  She continues to exercise routinely at her local [employer].  She denies headache, dizziness, polyuria, polydipsia, or polyphagia.  She endorses periodic nausea after taking Victoza.  Patient also has a history of hypertension.  Hypertension has been well controlled on antihypertensive medications.  Patient checks blood pressures periodically at home, which have been within a normal range.  Patient is up-to-date with mammogram and vaccinations.  She is requesting a DEXA scan.    Past Medical History:  Diagnosis Date  . Arthritis    knees  . Cancer ([place]) 2000   left breast  . Diabetes mellitus without complication ([place])   . Hyperlipemia   . Hypertension   . Sleep apnea    has CPAP but does not wear every night   Social History   Social History  . Marital status: Married    Spouse name: N/A  . Number of children: N/A  . Years of education: N/A   Occupational History  . Not on file.   Social History Main Topics  . Smoking status: Former Smoker    Quit date: 05/24/1993  . Smokeless tobacco: Never Used  . Alcohol use 0.0 oz/week     Comment: occasionlly  . Drug use: No  . Sexual activity: Not on file   Other Topics Concern  . Not on file   Social History Narrative  . No narrative on file   Immunization History  Administered Date(s) Administered  . Influenza,inj,Quad PF,6+ Mos 02/18/2014, 01/30/2015, 02/20/2016  . Pneumococcal Conjugate-13 08/15/2015  . Pneumococcal Polysaccharide-23 04/11/2014  . Tdap 07/15/2014  . Zoster 04/11/2014  Review of Systems  Constitutional:  Negative.  Negative for malaise/fatigue.  HENT: Negative.   Eyes: Negative.   Respiratory: Negative.   Cardiovascular: Positive for leg swelling.  Gastrointestinal: Negative for abdominal pain.  Genitourinary: Negative.  Negative for dysuria and urgency.  Musculoskeletal: Negative.   Skin: Negative.   Neurological: Negative.  Negative for dizziness.  Psychiatric/Behavioral: Negative.    Physical Exam  Constitutional: She is oriented to person, place, and time and well-developed, well-nourished, and in no distress.  HENT:  Head: Normocephalic and atraumatic.  Right Ear: External ear normal.  Left Ear: External ear normal.  Nose: Nose normal.  Mouth/Throat: Oropharynx is clear and moist.  Eyes: Pupils are equal, round, and reactive to light. Conjunctivae are normal.  Neck: Normal range of motion. Neck supple.  Cardiovascular: Normal rate and regular rhythm.   Left carotid bruit  Pulmonary/Chest: Effort normal and breath sounds normal.  Abdominal: Soft. Bowel sounds are normal.  Musculoskeletal: Normal range of motion.  Neurological: She is alert and oriented to person, place, and time. Gait normal.   Plan  1. Type 2 diabetes mellitus Hemoglobin A1c has decreased to 6.1, which is at goal.  Patient is currently taking metformin 1000 mg twice daily extended release and Victoza.  Recommend that patient decrease his metformin 1000 mg daily.  She is to follow-up in Endocrinology as scheduled.  - HgB A1c  2. Essential hypertension Blood pressure is at goal on current medication regimen.  No changes warranted at this time.  3. Hyperlipidemia,  unspecified hyperlipidemia type The 10-year ASCVD risk score Mikey Bussing DC Jr., et al., 2013) is: 21.8%   Values used to calculate the score:     Age: 89 years     Sex: Female     Is Non-Hispanic African American: Yes     Diabetic: Yes     Tobacco smoker: No     Systolic Blood Pressure: 253 mmHg     Is BP treated: Yes     HDL Cholesterol: 47  mg/dL     Total Cholesterol: 157 mg/dL Continue statin and ASA therapy Recommend a lowfat, low carbohydrate diet divided over 5-6 small meals, increase water intake to 6-8 glasses, and 150 minutes per week of cardiovascular exercise.   4. Left carotid bruit - US Carotid Duplex Bilateral; Future - VAS US CAROTID; Future  5. Hx of estrogen deficiency - DG BONE DENSITY (DXA); Future   RTC: Will follow up by phone after reviewing carotid dopplers   Donia Pounds  MSN, FNP-C [facility] [place] [address] [place], [place] [address] [phone]

## 2017-03-21 NOTE — Patient Instructions (Signed)
Your A1c has decreased to 6.1.  I recommend that you continue Metformin thousand milligrams extended release daily.  Discontinue your evening dose.  I will fax A1c results to Dr. Eden Emms.  Please continue carbohydrate diet as previously discussed.  I suspect a left carotid bruit.  I will order carotid Dopplers to be performed here at [facility].  We will schedule appointment and notify you with time and date.  I have also ordered a DEXA scan.  We will notify you with time and date.  Please follow-up in office as previously scheduled.

## 2017-03-22 ENCOUNTER — Ambulatory Visit (HOSPITAL_COMMUNITY)
Admission: RE | Admit: 2017-03-22 | Discharge: 2017-03-22 | Disposition: A | Discharge status: Home / Self Care | Payer: Medicare Other | Source: Ambulatory Visit | Attending: Vascular Surgery | Admitting: Vascular Surgery

## 2017-03-22 DIAGNOSIS — R0989 Other specified symptoms and signs involving the circulatory and respiratory systems: Secondary | ICD-10-CM

## 2017-03-22 LAB — VAS US CAROTID
LCCADSYS: -92 cm/s
LCCAPDIAS: 21 cm/s
LEFT ECA DIAS: -11 cm/s
LEFT VERTEBRAL DIAS: 15 cm/s
LICADDIAS: -39 cm/s
LICAPDIAS: 8 cm/s
LICAPSYS: 69 cm/s
Left CCA dist dias: -23 cm/s
Left CCA prox sys: 99 cm/s
Left ICA dist sys: -111 cm/s
RCCAPDIAS: -23 cm/s
RIGHT ECA DIAS: -10 cm/s
RIGHT VERTEBRAL DIAS: -14 cm/s
Right CCA prox sys: -143 cm/s
Right cca dist sys: -51 cm/s

## 2017-03-22 NOTE — Progress Notes (Signed)
Carotid artery duplex has been completed. Preliminary results can be found in chart review -> CV Proc  03/22/17 10:58 AM Carlos Levering RVT

## 2017-03-23 ENCOUNTER — Other Ambulatory Visit: Payer: Self-pay | Admitting: Family Medicine

## 2017-03-23 ENCOUNTER — Encounter: Payer: Self-pay | Admitting: Family Medicine

## 2017-03-23 DIAGNOSIS — I6523 Occlusion and stenosis of bilateral carotid arteries: Secondary | ICD-10-CM | POA: Insufficient documentation

## 2017-03-23 MED ORDER — ASPIRIN EC 81 MG PO TBEC: 81.0000 mg | DELAYED_RELEASE_TABLET | Freq: Every day | ORAL | 11 refills | Status: DC

## 2017-03-23 NOTE — Progress Notes (Signed)
Meds ordered this encounter  Medications  . aspirin EC 81 MG tablet    Sig: Take 1 tablet (81 mg total) by mouth daily.    Dispense:  30 tablet    Refill:  Gillespie  MSN, FNP-C [facility] [address] [place], [place] [address] [phone]

## 2017-03-23 NOTE — Progress Notes (Signed)
Carotid stenosis noted on carotid doppler study. Will start daily ASA and continue statin therapy. Will also send a referral to cardiology for further work up and evaluation.    Erin Pounds  MSN, FNP-C [facility] [address] [place], [place] [address] [phone]

## 2017-03-24 NOTE — Progress Notes (Signed)
Called and spoke with patient. Advised that doppler studies show mild stenosis which can be caused by a build up of cholesterol, fat, and other substances which can travel throughout the bloodstream. Advised to take asprin 81mg  once daily along with the Crestor and we will sent to cardiology for further review. Asked that patient keep next scheduled appointment. She verbalized understanding. Thanks!

## 2017-04-11 ENCOUNTER — Telehealth: Payer: Self-pay

## 2017-04-11 DIAGNOSIS — E785 Hyperlipidemia, unspecified: Secondary | ICD-10-CM

## 2017-04-11 MED ORDER — ROSUVASTATIN CALCIUM 10 MG PO TABS: 10.0000 mg | ORAL_TABLET | Freq: Every day | ORAL | 1 refills | Status: DC

## 2017-04-11 NOTE — Telephone Encounter (Signed)
Refill for Crestor sent into pharmacy. Thanks!

## 2017-04-12 ENCOUNTER — Other Ambulatory Visit: Payer: Self-pay

## 2017-04-12 DIAGNOSIS — E785 Hyperlipidemia, unspecified: Secondary | ICD-10-CM

## 2017-04-12 MED ORDER — ROSUVASTATIN CALCIUM 10 MG PO TABS: 10.0000 mg | ORAL_TABLET | Freq: Every day | ORAL | 1 refills | Status: DC

## 2017-04-12 NOTE — Telephone Encounter (Signed)
Refill for crestor cent into pharmacy. Thanks!

## 2017-04-21 ENCOUNTER — Encounter: Payer: Self-pay | Admitting: Cardiology

## 2017-04-21 ENCOUNTER — Ambulatory Visit: Payer: Medicare Other | Admitting: Cardiology

## 2017-04-21 DIAGNOSIS — I6523 Occlusion and stenosis of bilateral carotid arteries: Secondary | ICD-10-CM

## 2017-04-21 NOTE — Progress Notes (Signed)
PCP: Dorena Dew, FNP  Clinic Note: Chief Complaint  Patient presents with  . New Patient (Initial Visit)    Carotid bruit    HPI: Erin House is a 66 y.o. female who is being seen today for the evaluation of Carotid Bruit at the request of Dorena Dew, FNP.  [name] referred by her PCP who heard a soft carotid bruit --carotid Dopplers have already been ordered..  Recent Hospitalizations: None  Studies Personally Reviewed - (if available, images/films reviewed: From Epic Chart or Care Everywhere)  2D Echo Oct 11, 2016: Normal LV size and function.  Moderate concentric LVH.  EF 60-65%.  No RWMA.   G01 DD.  Normal RV size and function.  Mild tricuspid regurgitation otherwise no significant valve lesions.  Carotid Dopplers March 23, 2017: Mild intimal thickening with smooth homogenous disease.  Bilateral internal carotid arteries with 1-39% stenosis and tortuous vessels. (Most likely the bruit is heard but because of tortuous vessels)  Lower Extremity Venous Duplex January 20, 2017: No DVT or superficial thrombus.  Venous incompetence in bilateral common femoral and small saphenous veins.  Venous incompetence in the right saphenofemoral junction and great saphenous vein. -->  Was referred to Dr. Donzetta Matters.  Interval History: Modupe is a very pleasant woman who is here because of a bruit heard on exam.  She denies any TIA or amaurosis fugax symptoms.  She has sleep apnea and is using CPAP.  She was evaluated for some shortness of breath early this year with an echocardiogram reviewed above that was relatively normal.  She denies any PND, orthopnea or edema. She denies any rapid irregular heartbeats palpitations.  No chest pain or shortness of breath with rest or exertion. No syncope/near syncope. No TIA/amaurosis fugax symptoms. No melena, hematochezia, hematuria, or epstaxis. No claudication.  ROS: A comprehensive was performed. Review of Systems    Constitutional: Negative for malaise/fatigue.  Respiratory: Negative for shortness of breath.   Musculoskeletal: Negative for joint pain and myalgias.  Neurological: Negative for dizziness.  Psychiatric/Behavioral: The patient is not nervous/anxious.   All other systems reviewed and are negative.  I have reviewed and (if needed) personally updated the patient's problem list, medications, allergies, past medical and surgical history, social and family history.   Past Medical History:  Diagnosis Date  . Arthritis    knees  . Cancer ([place]) 2000   left breast  . Diabetes mellitus without complication ([place])   . Hyperlipemia   . Hypertension   . Sleep apnea    has CPAP but does not wear every night  . Venous insufficiency of both lower extremities 12/2016   Bilateral femoral and small saphenous vein incompetence.  Right saphenofemoral junction and right greater saphenous vein incompetence.    Past Surgical History:  Procedure Laterality Date  . BREAST BIOPSY Right 04/29/2014  . BREAST LUMPECTOMY WITH RADIOACTIVE SEED LOCALIZATION Right 06/11/2014   Procedure: BREAST LUMPECTOMY WITH RADIOACTIVE SEED LOCALIZATION;  Surgeon: Jackolyn Confer, MD;  Location: [facility];  Service: General;  Laterality: Right;  . CHOLECYSTECTOMY    . CYST ON WRIST     LEFT WRIST-BENIGN  . MASTECTOMY Left 05/1999  . ROTATOR CUFF REPAIR     LEFT SHOULDER  . WISDOM TOOTH EXTRACTION      Current Meds  Medication Sig  . aspirin EC 81 MG tablet Take 1 tablet (81 mg total) by mouth daily.  . Blood Glucose Monitoring Suppl (ACCU-CHEK AVIVA PLUS) w/Device KIT  1 each by Does not apply route daily.  . cetirizine (ZYRTEC) 10 MG tablet Take 1 tablet (10 mg total) by mouth daily.  Marland Kitchen glucose blood (ACCU-CHEK AVIVA) test strip Will check fasting blood sugar daily. Use as instructed  . liraglutide 18 MG/3ML SOPN 0.6 mg daily for 1 week. Week 2, increase to 1.2 mg daily  . lisinopril (PRINIVIL,ZESTRIL) 20  MG tablet Take 1 tablet (20 mg total) by mouth daily.  . metFORMIN (GLUCOPHAGE) 1000 MG tablet Take 1 tablet (1,000 mg total) by mouth 2 (two) times daily with a meal.  . rosuvastatin (CRESTOR) 10 MG tablet Take 1 tablet (10 mg total) daily by mouth.  . [DISCONTINUED] furosemide (LASIX) 20 MG tablet TAKE 1 TABLET BY MOUTH ONCE DAILY  . [DISCONTINUED] Vitamin D, Ergocalciferol, (DRISDOL) 50000 units CAPS capsule TAKE ONE CAPSULE BY MOUTH ONCE A WEEK   Current Facility-Administered Medications for the 04/21/17 encounter (Office Visit) with Leonie Man, MD  Medication  . betamethasone acetate-betamethasone sodium phosphate (CELESTONE) injection 3 mg    No Known Allergies  Social History   Socioeconomic History  . Marital status: Married    Spouse name: None  . Number of children: None  . Years of education: None  . Highest education level: None  Social Needs  . Financial resource strain: None  . Food insecurity - worry: None  . Food insecurity - inability: None  . Transportation needs - medical: None  . Transportation needs - non-medical: None  Occupational History  . None  Tobacco Use  . Smoking status: Former Smoker    Last attempt to quit: 05/24/1993    Years since quitting: 23.9  . Smokeless tobacco: Never Used  Substance and Sexual Activity  . Alcohol use: Yes    Alcohol/week: 0.0 oz    Comment: occasionlly  . Drug use: No  . Sexual activity: None  Other Topics Concern  . None  Social History Narrative  . None    family history is not on file.  Wt Readings from Last 3 Encounters:  04/21/17 231 lb (104.8 kg)  03/21/17 232 lb (105.2 kg)  01/20/17 236 lb (107 kg)    PHYSICAL EXAM BP (!) 144/90   Pulse 62   Ht 5' 0.25" (1.53 m)   Wt 231 lb (104.8 kg)   BMI 44.74 kg/m  Physical Exam  Constitutional: She is oriented to person, place, and time. She appears well-developed and well-nourished. No distress.  HENT:  Head: Normocephalic and atraumatic.    Mouth/Throat: No oropharyngeal exudate.  Eyes: EOM are normal. Pupils are equal, round, and reactive to light.  Neck: Normal range of motion. Neck supple. No JVD present. Carotid bruit is present (Soft bilateral bruits).  Cardiovascular: Normal rate, regular rhythm and intact distal pulses. Exam reveals no gallop and no friction rub.  No murmur heard. Nondisplaced PMI  Pulmonary/Chest: Effort normal and breath sounds normal. No respiratory distress. She has no wheezes. She has no rales.  Neurological: She is alert and oriented to person, place, and time. No cranial nerve deficit.  Psychiatric: She has a normal mood and affect. Her behavior is normal. Judgment and thought content normal.  Nursing note and vitals reviewed.    Adult ECG Report Sinus rhythm, rate 60 bpm.  Borderline voltage for LVH.  Otherwise normal EKG.  Other studies Reviewed: Additional studies/ records that were reviewed today include:  Recent Labs:   Lab Results  Component Value Date   CHOL 157 01/14/2017  HDL 47 (L) 01/14/2017   LDLCALC 76 01/14/2017   TRIG 169 (H) 01/14/2017   CHOLHDL 3.3 01/14/2017   Lab Results  Component Value Date   HGBA1C 6.1 03/21/2017    ASSESSMENT / PLAN: Problem List Items Addressed This Visit    Carotid stenosis, asymptomatic, bilateral    Carotid bruits showed only mild bilateral stenosis.  No obvious lesions of concern.  There is tortuosity noted which could explain the bruit. Recommendation will be to continue blood pressure, lipid and glycemic control.  She should continue to follow-up with Dr. Donzetta Matters who is seeing her from vascular surgery already for her venous stasis.  Dr. Gwenlyn Saran would be capable of following up carotid Dopplers as there are no active cardiac issues.      Relevant Orders   EKG 12-Lead      Current medicines are reviewed at length with the patient today. (+/- concerns) none The following changes have been made: none  Patient Instructions  NO  CHANGE WITH CURRENT MEDICATIONS OR TREATMENT.  Echo was good.  Carotid showed mild plaque   Can  Follow up  With DR Tse Bonito physician recommends that you schedule a follow-up appointment on an as needed basis.    Studies Ordered:   Orders Placed This Encounter  Procedures  . EKG 12-Lead      Glenetta Hew, M.D., M.S. Interventional Cardiologist   Pager # [phone] Phone # [phone] [address] [place],  [address]

## 2017-04-21 NOTE — Patient Instructions (Addendum)
NO CHANGE WITH CURRENT MEDICATIONS OR TREATMENT.  Echo was good.  Carotid showed mild plaque   Can  Follow up  With DR Spearfish physician recommends that you schedule a follow-up appointment on an as needed basis.

## 2017-04-22 ENCOUNTER — Other Ambulatory Visit: Payer: Self-pay | Admitting: Family Medicine

## 2017-04-22 ENCOUNTER — Telehealth: Payer: Self-pay

## 2017-04-22 DIAGNOSIS — R600 Localized edema: Secondary | ICD-10-CM

## 2017-04-22 MED ORDER — VITAMIN D (ERGOCALCIFEROL) 1.25 MG (50000 UNIT) PO CAPS: 50000.0000 [IU] | ORAL_CAPSULE | ORAL | 1 refills | Status: DC

## 2017-04-22 NOTE — Telephone Encounter (Signed)
Refills sent into pharmacy. Thanks!  

## 2017-04-23 ENCOUNTER — Encounter: Payer: Self-pay | Admitting: Cardiology

## 2017-04-23 NOTE — Assessment & Plan Note (Addendum)
Carotid bruits showed only mild bilateral stenosis.  No obvious lesions of concern.  There is tortuosity noted which could explain the bruit. Recommendation will be to continue blood pressure, lipid and glycemic control.  She should continue to follow-up with Dr. Donzetta Matters who is seeing her from vascular surgery already for her venous stasis.  Dr. Gwenlyn Saran would be capable of following up carotid Dopplers as there are no active cardiac issues.

## 2017-04-25 ENCOUNTER — Other Ambulatory Visit: Payer: Self-pay

## 2017-04-25 MED ORDER — VITAMIN D (ERGOCALCIFEROL) 1.25 MG (50000 UNIT) PO CAPS: 50000.0000 [IU] | ORAL_CAPSULE | ORAL | 1 refills | Status: DC

## 2017-04-26 ENCOUNTER — Ambulatory Visit: Payer: Medicare Other | Admitting: Vascular Surgery

## 2017-05-20 ENCOUNTER — Other Ambulatory Visit: Payer: Self-pay | Admitting: Family Medicine

## 2017-05-20 DIAGNOSIS — Z1231 Encounter for screening mammogram for malignant neoplasm of breast: Secondary | ICD-10-CM

## 2017-06-15 ENCOUNTER — Inpatient Hospital Stay: Admission: RE | Admit: 2017-06-15 | Payer: Medicare Other | Source: Ambulatory Visit

## 2017-06-15 ENCOUNTER — Ambulatory Visit: Payer: Medicare Other

## 2017-06-27 ENCOUNTER — Encounter: Payer: Self-pay | Admitting: Family Medicine

## 2017-06-27 ENCOUNTER — Ambulatory Visit: Payer: Medicare Other | Admitting: Family Medicine

## 2017-06-27 VITALS — BP 125/53 | HR 58 | Temp 97.9°F | Resp 16 | Ht 60.25 in | Wt 231.0 lb

## 2017-06-27 DIAGNOSIS — E559 Vitamin D deficiency, unspecified: Secondary | ICD-10-CM

## 2017-06-27 DIAGNOSIS — E119 Type 2 diabetes mellitus without complications: Secondary | ICD-10-CM | POA: Diagnosis not present

## 2017-06-27 DIAGNOSIS — I10 Essential (primary) hypertension: Secondary | ICD-10-CM

## 2017-06-27 DIAGNOSIS — M545 Low back pain, unspecified: Secondary | ICD-10-CM

## 2017-06-27 DIAGNOSIS — G8929 Other chronic pain: Secondary | ICD-10-CM

## 2017-06-27 DIAGNOSIS — L659 Nonscarring hair loss, unspecified: Secondary | ICD-10-CM

## 2017-06-27 LAB — POCT URINALYSIS DIP (DEVICE)
Bilirubin Urine: NEGATIVE
Glucose, UA: NEGATIVE mg/dL
HGB URINE DIPSTICK: NEGATIVE
LEUKOCYTES UA: NEGATIVE
NITRITE: NEGATIVE
PH: 6.5 (ref 5.0–8.0)
Protein, ur: NEGATIVE mg/dL
SPECIFIC GRAVITY, URINE: 1.025 (ref 1.005–1.030)
UROBILINOGEN UA: 1 mg/dL (ref 0.0–1.0)

## 2017-06-27 MED ORDER — CYCLOBENZAPRINE HCL 5 MG PO TABS: 5.0000 mg | ORAL_TABLET | Freq: Every day | ORAL | 1 refills | Status: DC

## 2017-06-27 NOTE — Patient Instructions (Addendum)
A referral has been sent to dermatology for further work up and evaluation of hair loss. Refrain from using relaxers or traction hair styles.     Alopecia Areata, Adult Alopecia areata is a condition that causes you to lose hair. You may lose hair on your scalp in patches. In some cases, you may lose all the hair on your scalp (alopecia totalis) or all the hair from your face and body (alopecia universalis). Alopecia areata is an autoimmune disease. This means that your body's defense system (immune system) mistakes normal parts of the body for germs or other things that can make you sick. When you have alopecia areata, the immune system attacks the hair follicles. Alopecia areata usually develops in childhood, but it can develop at any age. For some people, their hair grows back on its own and hair loss does not happen again. For others, their hair may fall out and grow back in cycles. The hair loss may last many years. Having this condition can be emotionally difficult, but it is not dangerous. What are the causes? The cause of this condition is not known. What increases the risk? This condition is more likely to develop in people who have:  A family history of alopecia.  A family history of another autoimmune disease, including type 1 diabetes and rheumatoid arthritis.  Asthma and allergies.  Down syndrome.  What are the signs or symptoms? Round spots of patchy hair loss on the scalp is the main symptom of this condition. The spots may be mildly itchy. Other symptoms include:  Short dark hairs in the bald patches that are wider at the top (exclamation point hairs).  Dents, white spots, or lines in the fingernails or toenails.  Balding and body hair loss. This is rare.  How is this diagnosed? This condition is diagnosed based on your symptoms and family history. Your health care provider will also check your scalp skin, teeth, and nails. Your health care provider may refer you to a  specialist in hair and skin disorders (dermatologist). You may also have tests, including:  A hair pull test.  Blood tests or other screening tests to check for autoimmune diseases, such as thyroid disease or diabetes.  Skin biopsy to confirm the diagnosis.  A procedure to examine the skin with a lighted magnifying instrument (dermoscopy).  How is this treated? There is no cure for alopecia areata. Treatment is aimed at promoting the regrowth of hair and preventing the immune system from overreacting. No single treatment is right for all people with alopecia areata. It depends on the type of hair loss you have and how severe it is. Work with your health care provider to find the best treatment for you. Treatment may include:  Having regular checkups to make sure the condition is not getting worse (watchful waiting).  Steroid creams or pills for 6-8 weeks to stop the immune reaction and help hair to regrow more quickly.  Other topical medicines to alter the immune system response and support the hair growth cycle.  Steroid injections.  Therapy and counseling with a support group or therapist if you are having trouble coping with hair loss.  Follow these instructions at home:  Learn as much as you can about your condition.  Apply topical creams only as told by your health care provider.  Take over-the-counter and prescription medicines only as told by your health care provider.  Consider getting a wig or products to make hair look fuller or to cover bald  spots, if you feel uncomfortable with your appearance.  Get therapy or counseling if you are having a hard time coping with hair loss. Ask your health care provider to recommend a counselor or support group.  Keep all follow-up visits as told by your health care provider. This is important. Contact a health care provider if:  Your hair loss gets worse, even with treatment.  You have new symptoms.  You are struggling  emotionally. Summary  Alopecia areata is an autoimmune condition that makes your body's defense system (immune system) attack the hair follicles. This causes you to lose hair.  Treatments may include regular checkups to make sure that the condition is not getting worse (watchful waiting), medicines, and steroid injections. This information is not intended to replace advice given to you by your health care provider. Make sure you discuss any questions you have with your health care provider. Document Released: 12/13/2003 Document Revised: 05/28/2016 Document Reviewed: 05/28/2016 Elsevier Interactive Patient Education  2018 [employer].

## 2017-06-28 LAB — VITAMIN D 25 HYDROXY (VIT D DEFICIENCY, FRACTURES): VIT D 25 HYDROXY: 35.5 ng/mL (ref 30.0–100.0)

## 2017-06-30 NOTE — Addendum Note (Signed)
Addended by: Adelina Mings on: 06/30/2017 09:12 AM   Modules accepted: Orders

## 2017-06-30 NOTE — Progress Notes (Signed)
Erin House,  a 67 year old female, with a history of type 2 diabetes mellitus, hypertension, and left breast cancer presents for follow-up of chronic conditions.  Patient reports that she is doing well and is with minimal complaints.  Patient was previously referred to endocrinology for further treatment and evaluation of uncontrolled diabetes. Diabetes has greatly improved.  Hemoglobin A1c is at goal.  She continues to exercise routinely at her local [employer].  She denies headache, dizziness, polyuria, polydipsia, or polyphagia.    Patient also has a history of hypertension.  Hypertension has been well controlled on antihypertensive medications.  Patient checks blood pressures periodically at home, which have been within a normal range.  Patient is complaining of hair thinning and hair loss over the past several months. She says that she has decreased use of harmful hair products. Also, she denies wearing traction hairstyles. She denies scalp itching or dandruff.   Patient has a mammogram and dexa scan scheduled and is up to date with immunizations.     Past Medical History:  Diagnosis Date  . Arthritis    knees  . Cancer ([place]) 2000   left breast  . Diabetes mellitus without complication ([place])   . Hyperlipemia   . Hypertension   . Sleep apnea    has CPAP but does not wear every night  . Venous insufficiency of both lower extremities 12/2016   Bilateral femoral and small saphenous vein incompetence.  Right saphenofemoral junction and right greater saphenous vein incompetence.   Social History   Socioeconomic History  . Marital status: Married    Spouse name: Not on file  . Number of children: Not on file  . Years of education: Not on file  . Highest education level: Not on file  Social Needs  . Financial resource strain: Not on file  . Food insecurity - worry: Not on file  . Food insecurity - inability: Not on file  . Transportation needs - medical: Not on file  .  Transportation needs - non-medical: Not on file  Occupational History  . Not on file  Tobacco Use  . Smoking status: Former Smoker    Last attempt to quit: 05/24/1993    Years since quitting: 24.1  . Smokeless tobacco: Never Used  Substance and Sexual Activity  . Alcohol use: Yes    Alcohol/week: 0.0 oz    Comment: occasionlly  . Drug use: No  . Sexual activity: Not on file  Other Topics Concern  . Not on file  Social History Narrative  . Not on file   Immunization History  Administered Date(s) Administered  . Influenza,inj,Quad PF,6+ Mos 02/18/2014, 01/30/2015, 02/20/2016  . Pneumococcal Conjugate-13 08/15/2015  . Pneumococcal Polysaccharide-23 04/11/2014  . Tdap 07/15/2014  . Zoster 04/11/2014  Review of Systems  Constitutional: Negative.  Negative for malaise/fatigue.  HENT: Negative.   Eyes: Negative.   Respiratory: Negative.   Gastrointestinal: Negative for abdominal pain.  Genitourinary: Negative.  Negative for dysuria and urgency.  Musculoskeletal: Positive for myalgias (left back).  Skin: Negative.        Hair loss, hair thinning  Neurological: Negative.  Negative for dizziness.  Psychiatric/Behavioral: Negative.    Physical Exam  Constitutional: She is oriented to person, place, and time and well-developed, well-nourished, and in no distress.  HENT:  Head: Normocephalic and atraumatic.  Right Ear: External ear normal.  Left Ear: External ear normal.  Nose: Nose normal.  Mouth/Throat: Oropharynx is clear and moist.  Eyes: Conjunctivae are normal. Pupils are  equal, round, and reactive to light.  Neck: Normal range of motion. Neck supple.  Cardiovascular: Normal rate and regular rhythm.  Left carotid bruit  Pulmonary/Chest: Effort normal and breath sounds normal.  Abdominal: Soft. Bowel sounds are normal.  Musculoskeletal:       Lumbar back: She exhibits decreased range of motion and tenderness (left back, tender to palpation). She exhibits no swelling and no  edema.       Arms: Neurological: She is alert and oriented to person, place, and time. Gait normal.  Skin:  Hair thinning, hair loss  BP (!) 125/53 (BP Location: Right Arm, Patient Position: Sitting, Cuff Size: Large)   Pulse (!) 58   Temp 97.9 F (36.6 C) (Oral)   Resp 16   Ht 5' 0.25" (1.53 m)   Wt 231 lb (104.8 kg)   SpO2 99%   BMI 44.74 kg/m  Plan  1. Essential hypertension Blood pressure is at goal on current medication regimen. No medication changes warranted on today.  Continue medication, monitor blood pressure at home. Continue DASH diet. Reminder to go to the ER if any CP, SOB, nausea, dizziness, severe HA, changes vision/speech, left arm numbness and tingling and jaw pain.  2. Obesity, morbid ([place]) Body mass index is 44.74 kg/m. Recommend a lowfat, low carbohydrate diet divided over 5-6 small meals, increase water intake to 6-8 glasses, and 150 minutes per week of cardiovascular exercise.    3. Vitamin D deficiency - Vitamin D, 25-hydroxy  4. Chronic right-sided low back pain without sciatica - cyclobenzaprine (FLEXERIL) 5 MG tablet; Take 1 tablet (5 mg total) by mouth at bedtime.  Dispense: 30 tablet; Refill: 1  5. Hair loss - Ambulatory referral to Dermatology  6. Alopecia Recommend that patient refrain from damaging products like relaxers and/or heavy oils. Also, refrain from wearing traction hairstyles (weaves, wigs, braids) - Ambulatory referral to Dermatology  7. Type 2 diabetes mellitus Hemoglobin A1c is 6.2, which is at goal.  Patient is currently taking metformin 500 mg twice daily and Victoza.   - HgB A1c Diabetic Foot Exam - Simple   Simple Foot Form Diabetic Foot exam was performed with the following findings:  Yes 06/27/2017 11:00 AM  Visual Inspection No deformities, no ulcerations, no other skin breakdown bilaterally:  Yes Sensation Testing Intact to touch and monofilament testing bilaterally:  Yes Pulse Check Posterior Tibialis and Dorsalis  pulse intact bilaterally:  Yes Comments     RTC: Will follow up by phone after reviewing laboratory results. 6 months for chronic conditions   Donia Pounds  MSN, FNP-C [facility] [address] [place], [place] [address] [phone]

## 2017-07-01 ENCOUNTER — Telehealth: Payer: Self-pay

## 2017-07-01 LAB — SPECIMEN STATUS REPORT

## 2017-07-01 LAB — TSH: TSH: 0.518 u[IU]/mL (ref 0.450–4.500)

## 2017-07-01 NOTE — Telephone Encounter (Signed)
-----   Message from Dorena Dew, [place] sent at 07/01/2017  7:09 AM EST ----- Regarding: lab results Please inform patient that thyroid studies were negative. Referral sent to dermatology for further workup and evaluation.  Thanks

## 2017-07-01 NOTE — Telephone Encounter (Signed)
Called and left a message advising that thyroid studies were negative and that reminded that we are sending a referral to dermatology for further workup. Asked if any questions to cal back to our office and left call back number. Thanks!

## 2017-07-27 ENCOUNTER — Ambulatory Visit: Payer: Medicare Other | Admitting: Podiatry

## 2017-08-08 ENCOUNTER — Ambulatory Visit
Admission: RE | Admit: 2017-08-08 | Discharge: 2017-08-08 | Disposition: A | Discharge status: Home / Self Care | Payer: Medicare Other | Source: Ambulatory Visit | Attending: Family Medicine | Admitting: Family Medicine

## 2017-08-08 DIAGNOSIS — Z1231 Encounter for screening mammogram for malignant neoplasm of breast: Secondary | ICD-10-CM

## 2017-08-08 DIAGNOSIS — Z8639 Personal history of other endocrine, nutritional and metabolic disease: Secondary | ICD-10-CM

## 2017-08-10 ENCOUNTER — Other Ambulatory Visit: Payer: Self-pay | Admitting: Family Medicine

## 2017-08-10 DIAGNOSIS — R600 Localized edema: Secondary | ICD-10-CM

## 2017-08-12 ENCOUNTER — Ambulatory Visit (INDEPENDENT_AMBULATORY_CARE_PROVIDER_SITE_OTHER): Payer: Medicare Other | Admitting: Family Medicine

## 2017-08-12 ENCOUNTER — Encounter: Payer: Self-pay | Admitting: Family Medicine

## 2017-08-12 ENCOUNTER — Telehealth: Payer: Self-pay

## 2017-08-12 VITALS — BP 142/72 | HR 89 | Temp 99.7°F | Resp 18 | Ht 60.25 in | Wt 224.0 lb

## 2017-08-12 DIAGNOSIS — R52 Pain, unspecified: Secondary | ICD-10-CM

## 2017-08-12 DIAGNOSIS — J101 Influenza due to other identified influenza virus with other respiratory manifestations: Secondary | ICD-10-CM | POA: Diagnosis not present

## 2017-08-12 DIAGNOSIS — R05 Cough: Secondary | ICD-10-CM

## 2017-08-12 DIAGNOSIS — R059 Cough, unspecified: Secondary | ICD-10-CM

## 2017-08-12 LAB — POCT INFLUENZA A/B
INFLUENZA A, POC: POSITIVE — AB
Influenza B, POC: NEGATIVE

## 2017-08-12 MED ORDER — GUAIFENESIN-CODEINE 100-10 MG/5ML PO SYRP: 5.0000 mL | ORAL_SOLUTION | Freq: Three times a day (TID) | ORAL | 0 refills | Status: DC | PRN

## 2017-08-12 MED ORDER — OSELTAMIVIR PHOSPHATE 75 MG PO CAPS: 75.0000 mg | ORAL_CAPSULE | Freq: Two times a day (BID) | ORAL | 0 refills | Status: AC

## 2017-08-12 MED ORDER — ACETAMINOPHEN 500 MG PO TABS: 500.0000 mg | ORAL_TABLET | Freq: Four times a day (QID) | ORAL | 0 refills | Status: AC | PRN

## 2017-08-12 NOTE — Patient Instructions (Signed)
You have influenza A. Will start a course of Tamiflu 75 mg twice daily.  Tylenol 500 mg every 6 hours as needed for fever or body aches Guiatussin 5 ml every 6 hours as needed for persistent cough  Increase rest, handwashing, and fluid intake.   PLEASE GET SOME REST!!!!!!  The patient was given clear instructions to go to ER or return to medical center if symptoms do not improve, worsen or new problems develop. The patient verbalized understanding.  . Influenza, Adult Influenza ("the flu") is an infection in the lungs, nose, and throat (respiratory tract). It is caused by a virus. The flu causes many common cold symptoms, as well as a high fever and body aches. It can make you feel very sick. The flu spreads easily from person to person (is contagious). Getting a flu shot (influenza vaccination) every year is the best way to prevent the flu. Follow these instructions at home:  Take over-the-counter and prescription medicines only as told by your doctor.  Use a cool mist humidifier to add moisture (humidity) to the air in your home. This can make it easier to breathe.  Rest as needed.  Drink enough fluid to keep your pee (urine) clear or pale yellow.  Cover your mouth and nose when you cough or sneeze.  Wash your hands with soap and water often, especially after you cough or sneeze. If you cannot use soap and water, use hand sanitizer.  Stay home from work or school as told by your doctor. Unless you are visiting your doctor, try to avoid leaving home until your fever has been gone for 24 hours without the use of medicine.  Keep all follow-up visits as told by your doctor. This is important. How is this prevented?  Getting a yearly (annual) flu shot is the best way to avoid getting the flu. You may get the flu shot in late summer, fall, or winter. Ask your doctor when you should get your flu shot.  Wash your hands often or use hand sanitizer often.  Avoid contact with people who  are sick during cold and flu season.  Eat healthy foods.  Drink plenty of fluids.  Get enough sleep.  Exercise regularly. Contact a doctor if:  You get new symptoms.  You have: ? Chest pain. ? Watery poop (diarrhea). ? A fever.  Your cough gets worse.  You start to have more mucus.  You feel sick to your stomach (nauseous).  You throw up (vomit). Get help right away if:  You start to be short of breath or have trouble breathing.  Your skin or nails turn a bluish color.  You have very bad pain or stiffness in your neck.  You get a sudden headache.  You get sudden pain in your face or ear.  You cannot stop throwing up. This information is not intended to replace advice given to you by your health care provider. Make sure you discuss any questions you have with your health care provider. Document Released: 02/17/2008 Document Revised: 10/16/2015 Document Reviewed: 03/04/2015 Elsevier Interactive Patient Education  2017 [employer].

## 2017-08-12 NOTE — Progress Notes (Signed)
Subjective:    Patient ID: Erin House, female    DOB: 1951/05/15, 67 y.o.   MRN: [id number]  HPI Erin House, a 67 year old female with a history of DMII, hypertension, and left breast cancer presents complaining of uppe respiratory symptoms over the past 2 days. Patient says that she developed cough, fever, fatigue, sneezing, nasal congestion, and sore throat during a 12 hours car ride from [place] to [place]. She says that symptoms have worsened over the past 12 hours. Patient has taken OTC medications without relief. She has been drinking very little and does not have an appetite.   Past Medical History:  Diagnosis Date  . Arthritis    knees  . Cancer ([place]) 2000   left breast  . Diabetes mellitus without complication ([place])   . Hyperlipemia   . Hypertension   . Sleep apnea    has CPAP but does not wear every night  . Venous insufficiency of both lower extremities 12/2016   Bilateral femoral and small saphenous vein incompetence.  Right saphenofemoral junction and right greater saphenous vein incompetence.   Social History   Socioeconomic History  . Marital status: Married    Spouse name: Not on file  . Number of children: Not on file  . Years of education: Not on file  . Highest education level: Not on file  Occupational History  . Not on file  Social Needs  . Financial resource strain: Not on file  . Food insecurity:    Worry: Not on file    Inability: Not on file  . Transportation needs:    Medical: Not on file    Non-medical: Not on file  Tobacco Use  . Smoking status: Former Smoker    Last attempt to quit: 05/24/1993    Years since quitting: 24.2  . Smokeless tobacco: Never Used  Substance and Sexual Activity  . Alcohol use: Yes    Alcohol/week: 0.0 oz    Comment: occasionlly  . Drug use: No  . Sexual activity: Not on file  Lifestyle  . Physical activity:    Days per week: Not on file    Minutes per session: Not on file  . Stress: Not on  file  Relationships  . Social connections:    Talks on phone: Not on file    Gets together: Not on file    Attends religious service: Not on file    Active member of club or organization: Not on file    Attends meetings of clubs or organizations: Not on file    Relationship status: Not on file  . Intimate partner violence:    Fear of current or ex partner: Not on file    Emotionally abused: Not on file    Physically abused: Not on file    Forced sexual activity: Not on file  Other Topics Concern  . Not on file  Social History Narrative  . Not on file   Immunization History  Administered Date(s) Administered  . Influenza,inj,Quad PF,6+ Mos 02/18/2014, 01/30/2015, 02/20/2016  . Pneumococcal Conjugate-13 08/15/2015  . Pneumococcal Polysaccharide-23 04/11/2014  . Tdap 07/15/2014  . Zoster 04/11/2014     Review of Systems  Constitutional: Positive for chills, fatigue and fever.  HENT: Positive for sinus pressure.   Eyes: Negative.   Respiratory: Positive for cough and shortness of breath.   Cardiovascular: Negative.   Gastrointestinal: Negative.   Endocrine: Negative for polydipsia, polyphagia and polyuria.  Genitourinary: Negative.   Musculoskeletal:  Positive for myalgias.  Skin: Negative.   Neurological: Negative.   Hematological: Negative.   Psychiatric/Behavioral: Negative.        Objective:   Physical Exam  Constitutional: She appears ill.  HENT:  Right Ear: Tympanic membrane is erythematous.  Left Ear: Tympanic membrane is erythematous.  Nose: Mucosal edema present. Right sinus exhibits no maxillary sinus tenderness and no frontal sinus tenderness. Left sinus exhibits no maxillary sinus tenderness and no frontal sinus tenderness.  Mouth/Throat: Uvula is midline. Oropharyngeal exudate and posterior oropharyngeal erythema present.  Cardiovascular: Normal rate, regular rhythm and normal heart sounds.  Pulmonary/Chest: Effort normal and breath sounds normal. No  respiratory distress. She has no wheezes. She has no rales. She exhibits no tenderness.  Abdominal: Soft. Bowel sounds are normal.     BP (!) 142/72 (BP Location: Right Arm, Patient Position: Sitting, Cuff Size: Large)   Pulse 89   Temp 99.7 F (37.6 C) (Oral)   Resp 18   Ht 5' 0.25" (1.53 m)   Wt 224 lb (101.6 kg)   SpO2 97%   BMI 43.38 kg/m  Assessment & Plan:  1. Influenza A Increase rest, handwashing, and fluid intake.  - oseltamivir (TAMIFLU) 75 MG capsule; Take 1 capsule (75 mg total) by mouth 2 (two) times daily for 5 days.  Dispense: 10 capsule; Refill: 0  2. Body aches - Influenza A/B - acetaminophen (TYLENOL) 500 MG tablet; Take 1 tablet (500 mg total) by mouth every 6 (six) hours as needed.  Dispense: 30 tablet; Refill: 0  3. Cough - guaiFENesin-codeine (ROBITUSSIN AC) 100-10 MG/5ML syrup; Take 5 mLs by mouth 3 (three) times daily as needed for cough.  Dispense: 120 mL; Refill: 0   RTC: As previously scheduled   The patient was given clear instructions to go to ER or return to medical center if symptoms do not improve, worsen or new problems develop. The patient verbalized understanding.   Donia Pounds  MSN, FNP-C [facility] [address] [place], [place] [address] [phone]

## 2017-08-12 NOTE — Telephone Encounter (Signed)
Called, no answer. Left a message for patient to call back. Thanks!  

## 2017-08-24 ENCOUNTER — Encounter: Payer: Self-pay | Admitting: Family Medicine

## 2017-08-24 DIAGNOSIS — L659 Nonscarring hair loss, unspecified: Secondary | ICD-10-CM | POA: Insufficient documentation

## 2017-11-19 ENCOUNTER — Other Ambulatory Visit: Payer: Self-pay | Admitting: Family Medicine

## 2017-11-19 DIAGNOSIS — R0982 Postnasal drip: Secondary | ICD-10-CM

## 2017-11-23 ENCOUNTER — Telehealth: Payer: Self-pay

## 2017-11-23 NOTE — Telephone Encounter (Signed)
Called, no answer and voicemail was full. Thanks!

## 2017-12-26 ENCOUNTER — Encounter: Payer: Self-pay | Admitting: Family Medicine

## 2017-12-26 ENCOUNTER — Ambulatory Visit (INDEPENDENT_AMBULATORY_CARE_PROVIDER_SITE_OTHER): Payer: Medicare Other | Admitting: Family Medicine

## 2017-12-26 VITALS — BP 122/82 | HR 62 | Temp 98.1°F | Resp 16 | Ht 60.25 in | Wt 228.0 lb

## 2017-12-26 DIAGNOSIS — I10 Essential (primary) hypertension: Secondary | ICD-10-CM | POA: Diagnosis not present

## 2017-12-26 DIAGNOSIS — E559 Vitamin D deficiency, unspecified: Secondary | ICD-10-CM

## 2017-12-26 DIAGNOSIS — I6523 Occlusion and stenosis of bilateral carotid arteries: Secondary | ICD-10-CM

## 2017-12-26 DIAGNOSIS — E785 Hyperlipidemia, unspecified: Secondary | ICD-10-CM | POA: Diagnosis not present

## 2017-12-26 DIAGNOSIS — E1165 Type 2 diabetes mellitus with hyperglycemia: Secondary | ICD-10-CM

## 2017-12-26 DIAGNOSIS — E119 Type 2 diabetes mellitus without complications: Secondary | ICD-10-CM | POA: Diagnosis not present

## 2017-12-26 DIAGNOSIS — IMO0001 Reserved for inherently not codable concepts without codable children: Secondary | ICD-10-CM

## 2017-12-26 LAB — POCT URINALYSIS DIPSTICK
Bilirubin, UA: NEGATIVE
Blood, UA: NEGATIVE
Glucose, UA: NEGATIVE
Ketones, UA: NEGATIVE
Leukocytes, UA: NEGATIVE
Nitrite, UA: NEGATIVE
Protein, UA: NEGATIVE
Spec Grav, UA: 1.01 (ref 1.010–1.025)
Urobilinogen, UA: 0.2 E.U./dL
pH, UA: 5.5 (ref 5.0–8.0)

## 2017-12-26 LAB — POCT GLYCOSYLATED HEMOGLOBIN (HGB A1C): Hemoglobin A1C: 6.3 % — AB (ref 4.0–5.6)

## 2017-12-26 MED ORDER — ASPIRIN EC 81 MG PO TBEC: 81.0000 mg | DELAYED_RELEASE_TABLET | Freq: Every day | ORAL | Status: DC

## 2017-12-26 MED ORDER — LIRAGLUTIDE 18 MG/3ML ~~LOC~~ SOPN: PEN_INJECTOR | SUBCUTANEOUS | 12 refills | Status: DC

## 2017-12-26 MED ORDER — ROSUVASTATIN CALCIUM 10 MG PO TABS: 10.0000 mg | ORAL_TABLET | Freq: Every day | ORAL | 1 refills | Status: DC

## 2017-12-26 MED ORDER — LISINOPRIL 20 MG PO TABS: 20.0000 mg | ORAL_TABLET | Freq: Every day | ORAL | 3 refills | Status: DC

## 2017-12-26 MED ORDER — POTASSIUM CHLORIDE ER 10 MEQ PO TBCR: 10.0000 meq | EXTENDED_RELEASE_TABLET | Freq: Every day | ORAL | 1 refills | Status: DC

## 2017-12-26 MED ORDER — FUROSEMIDE 40 MG PO TABS: 40.0000 mg | ORAL_TABLET | Freq: Every day | ORAL | 1 refills | Status: DC

## 2017-12-26 NOTE — Progress Notes (Signed)
Erin House  [birth year] 67 y.o. [id number]   Chief Complaint  Patient presents with  . Hypertension  . Diabetes  . Medication Refill    wants fluid pills increased   . Follow-up    wants bone density results     HPI   Hypertension Patient is here for follow-up of elevated blood pressure. Blood pressure not checking well controlled at home. Patient denies chest pain, dyspnea, fatigue, irregular heart beat and palpitations. Cardiovascular risk factors: advanced age (older than 69 for men, 68 for women), diabetes mellitus, dyslipidemia, obesity (BMI >= 30 kg/m2) and sedentary lifestyle.   Diabetes medication compliance: compliant most of the time, diabetic diet compliance: noncompliant much of the time, last eye exam approximately 3 months ago  Hyperlipidemia Follow up of elevated cholesterol. Compliance with treatment has been adequate.  Patient denies muscle pain associated with her medications.  her is not exercising and is not adherent to a low-salt, ADA, heart healthy or carbohydrate modified diet.   ROS  Objective    BP 122/82 Comment: manually  Pulse 62   Temp 98.1 F (36.7 C) (Oral)   Resp 16   Ht 5' 0.25" (1.53 m)   Wt 228 lb (103.4 kg)   SpO2 100%   BMI 44.16 kg/m   Physical Exam   Assessment     ICD-10-CM   1. Essential hypertension I10 Urinalysis Dipstick    lisinopril (PRINIVIL,ZESTRIL) 20 MG tablet    furosemide (LASIX) 40 MG tablet    Comprehensive metabolic panel    Lipid Panel    Microalbumin/Creatinine Ratio, Urine  2. Type 2 diabetes mellitus without complication, without long-term current use of insulin (HCC) E11.9 HgB A1c    Lipid Panel  3. Hyperlipidemia E78.5 rosuvastatin (CRESTOR) 10 MG tablet    Lipid Panel  4. Carotid stenosis, asymptomatic, bilateral I65.23 aspirin EC 81 MG tablet  5. Vitamin D deficiency E55.9 VITAMIN D 25 Hydroxy (Vit-D Deficiency, Fractures)  6. Uncontrolled type 2 diabetes mellitus without complication,  without long-term current use of insulin (HCC) E11.65 liraglutide (VICTOZA) 18 MG/3ML SOPN     Plan    1. Essential hypertension The current medical regimen is effective;  continue present plan and medications - Urinalysis Dipstick - lisinopril (PRINIVIL,ZESTRIL) 20 MG tablet; Take 1 tablet (20 mg total) by mouth daily.  Dispense: 90 tablet; Refill: 3 - furosemide (LASIX) 40 MG tablet; Take 1 tablet (40 mg total) by mouth daily.  Dispense: 90 tablet; Refill: 1 - Comprehensive metabolic panel - Lipid Panel - Microalbumin/Creatinine Ratio, Urine  2. Type 2 diabetes mellitus without complication, without long-term current use of insulin (HCC) The current medical regimen is effective;  continue present plan and medications.  - HgB A1c - Lipid Panel  3. Hyperlipidemia The current medical regimen is effective;  continue present plan and medications.  - rosuvastatin (CRESTOR) 10 MG tablet; Take 1 tablet (10 mg total) by mouth daily.  Dispense: 90 tablet; Refill: 1 - Lipid Panel  4. Carotid stenosis, asymptomatic, bilateral The current medical regimen is effective;  continue present plan and medications.  - aspirin EC 81 MG tablet; Take 1 tablet (81 mg total) by mouth daily.  Dispense: 90 tablet; Refill: 03  5. Vitamin D deficiency The current medical regimen is effective;  continue present plan and medications. Reviewed bone density results with patient. Negative.  - VITAMIN D 25 Hydroxy (Vit-D Deficiency, Fractures)  6. Uncontrolled type 2 diabetes mellitus without complication, without long-term current use of insulin (  [place]) The current medical regimen is effective;  continue present plan and medications. Patient reports eating a lot of sweets in the past 3 weeks.  - liraglutide (VICTOZA) 18 MG/3ML SOPN; 0.6 mg daily for 1 week. Week 2, increase to 1.2 mg daily  Dispense: 3 mL; Refill: 12       Return to care as scheduled and prn. Patient verbalized understanding and agreed  with plan of care.

## 2017-12-26 NOTE — Patient Instructions (Signed)
Potassium  Hypokalemia means that the amount of potassium in the blood is lower than normal.Potassium is a chemical that helps regulate the amount of fluid in the body (electrolyte). It also stimulates muscle tightening (contraction) and helps nerves work properly.Normally, most of the body's potassium is inside of cells, and only a very small amount is in the blood. Because the amount in the blood is so small, minor changes to potassium levels in the blood can be life-threatening. What are the causes? This condition may be caused by:  Antibiotic medicine.  Diarrhea or vomiting. Taking too much of a medicine that helps you have a bowel movement (laxative) can cause diarrhea and lead to hypokalemia.  Chronic kidney disease (CKD).  Medicines that help the body get rid of excess fluid (diuretics).  Eating disorders, such as bulimia.  Low magnesium levels in the body.  Sweating a lot.  What are the signs or symptoms? Symptoms of this condition include:  Weakness.  Constipation.  Fatigue.  Muscle cramps.  Mental confusion.  Skipped heartbeats or irregular heartbeat (palpitations).  Tingling or numbness.  How is this diagnosed? This condition is diagnosed with a blood test. How is this treated? Hypokalemia can be treated by taking potassium supplements by mouth or adjusting the medicines that you take. Treatment may also include eating more foods that contain a lot of potassium. If your potassium level is very low, you may need to get potassium through an IV tube in one of your veins and be monitored in the hospital. Follow these instructions at home:  Take over-the-counter and prescription medicines only as told by your health care provider. This includes vitamins and supplements.  Eat a healthy diet. A healthy diet includes fresh fruits and vegetables, whole grains, healthy fats, and lean proteins.  If instructed, eat more foods that contain a lot of potassium, such  as: ? Nuts, such as peanuts and pistachios. ? Seeds, such as sunflower seeds and pumpkin seeds. ? Peas, lentils, and lima beans. ? Whole grain and bran cereals and breads. ? Fresh fruits and vegetables, such as apricots, avocado, bananas, cantaloupe, kiwi, oranges, tomatoes, asparagus, and potatoes. ? Orange juice. ? Tomato juice. ? Red meats. ? Yogurt.  Keep all follow-up visits as told by your health care provider. This is important. Contact a health care provider if:  You have weakness that gets worse.  You feel your heart pounding or racing.  You vomit.  You have diarrhea.  You have diabetes (diabetes mellitus) and you have trouble keeping your blood sugar (glucose) in your target range. Get help right away if:  You have chest pain.  You have shortness of breath.  You have vomiting or diarrhea that lasts for more than 2 days.  You faint. This information is not intended to replace advice given to you by your health care provider. Make sure you discuss any questions you have with your health care provider. Document Released: 05/10/2005 Document Revised: 12/27/2015 Document Reviewed: 12/27/2015 Elsevier Interactive Patient Education  2018 [employer].

## 2017-12-27 LAB — COMPREHENSIVE METABOLIC PANEL
ALT: 37 IU/L — ABNORMAL HIGH (ref 0–32)
AST: 22 IU/L (ref 0–40)
Albumin/Globulin Ratio: 1.4 (ref 1.2–2.2)
Albumin: 4.2 g/dL (ref 3.6–4.8)
Alkaline Phosphatase: 83 IU/L (ref 39–117)
BUN/Creatinine Ratio: 19 (ref 12–28)
BUN: 13 mg/dL (ref 8–27)
Bilirubin Total: 0.6 mg/dL (ref 0.0–1.2)
CO2: 27 mmol/L (ref 20–29)
Calcium: 9.8 mg/dL (ref 8.7–10.3)
Chloride: 99 mmol/L (ref 96–106)
Creatinine, Ser: 0.67 mg/dL (ref 0.57–1.00)
GFR calc Af Amer: 104 mL/min/{1.73_m2} (ref >59)
GFR calc non Af Amer: 90 mL/min/{1.73_m2} (ref >59)
Globulin, Total: 3 g/dL (ref 1.5–4.5)
Glucose: 96 mg/dL (ref 65–99)
Potassium: 3.9 mmol/L (ref 3.5–5.2)
Sodium: 143 mmol/L (ref 134–144)
Total Protein: 7.2 g/dL (ref 6.0–8.5)

## 2017-12-27 LAB — LIPID PANEL
Chol/HDL Ratio: 3.9 ratio (ref 0.0–4.4)
Cholesterol, Total: 233 mg/dL — ABNORMAL HIGH (ref 100–199)
HDL: 60 mg/dL (ref >39)
LDL Calculated: 123 mg/dL — ABNORMAL HIGH (ref 0–99)
Triglycerides: 248 mg/dL — ABNORMAL HIGH (ref 0–149)
VLDL Cholesterol Cal: 50 mg/dL — ABNORMAL HIGH (ref 5–40)

## 2017-12-27 LAB — MICROALBUMIN / CREATININE URINE RATIO
Creatinine, Urine: 19.2 mg/dL
Microalb/Creat Ratio: <15.6 mg/g creat (ref 0.0–30.0)
Microalbumin, Urine: <3 ug/mL

## 2017-12-27 LAB — VITAMIN D 25 HYDROXY (VIT D DEFICIENCY, FRACTURES): Vit D, 25-Hydroxy: 20.3 ng/mL — ABNORMAL LOW (ref 30.0–100.0)

## 2017-12-28 ENCOUNTER — Telehealth: Payer: Self-pay

## 2017-12-28 NOTE — Progress Notes (Signed)
Cholesterol and triglycerides have increased. Patient needs to exercise and to do a low carb diet in order to lose weight. Will repeat in 6 months. If not lower then, will increase medications.

## 2017-12-28 NOTE — Telephone Encounter (Signed)
-----   Message from Lanae Boast, [place] sent at 12/28/2017  2:53 PM EDT ----- Cholesterol and triglycerides have increased. Patient needs to exercise and to do a low carb diet in order to lose weight. Will repeat in 6 months. If not lower then, will increase medications.

## 2017-12-28 NOTE — Telephone Encounter (Signed)
Called, no answer. Left a voicemail for patient to call back. Thanks!  

## 2018-03-30 ENCOUNTER — Telehealth: Payer: Self-pay

## 2018-03-30 NOTE — Telephone Encounter (Signed)
Patient will be at appointment on 04/03/2018 at 920am.

## 2018-03-31 ENCOUNTER — Ambulatory Visit: Payer: Medicare Other | Admitting: Family Medicine

## 2018-04-03 ENCOUNTER — Ambulatory Visit (INDEPENDENT_AMBULATORY_CARE_PROVIDER_SITE_OTHER): Payer: Medicare Other | Admitting: Family Medicine

## 2018-04-03 ENCOUNTER — Encounter: Payer: Self-pay | Admitting: Family Medicine

## 2018-04-03 VITALS — BP 122/67 | HR 67 | Temp 97.7°F | Resp 16 | Ht 60.25 in | Wt 233.0 lb

## 2018-04-03 DIAGNOSIS — E119 Type 2 diabetes mellitus without complications: Secondary | ICD-10-CM

## 2018-04-03 DIAGNOSIS — I10 Essential (primary) hypertension: Secondary | ICD-10-CM | POA: Diagnosis not present

## 2018-04-03 DIAGNOSIS — I739 Peripheral vascular disease, unspecified: Secondary | ICD-10-CM

## 2018-04-03 LAB — POCT URINALYSIS DIPSTICK
Bilirubin, UA: NEGATIVE
Blood, UA: NEGATIVE
Glucose, UA: NEGATIVE
Ketones, UA: NEGATIVE
Leukocytes, UA: NEGATIVE
Nitrite, UA: NEGATIVE
Protein, UA: NEGATIVE
Spec Grav, UA: 1.01 (ref 1.010–1.025)
Urobilinogen, UA: 0.2 E.U./dL
pH, UA: 5 (ref 5.0–8.0)

## 2018-04-03 LAB — POCT GLYCOSYLATED HEMOGLOBIN (HGB A1C): Hemoglobin A1C: 6.4 % — AB (ref 4.0–5.6)

## 2018-04-03 LAB — GLUCOSE, POCT (MANUAL RESULT ENTRY): POC Glucose: 131 mg/dl — AB (ref 70–99)

## 2018-04-03 NOTE — Patient Instructions (Signed)
Varicose Veins Varicose veins are veins that have become enlarged and twisted. They are usually seen in the legs but can occur in other parts of the body as well. What are the causes? This condition is the result of valves in the veins not working properly. Valves in the veins help to return blood from the leg to the heart. If these valves are damaged, blood flows backward and backs up into the veins in the leg near the skin. This causes the veins to become larger. What increases the risk? People who are on their feet a lot, who are pregnant, or who are overweight are more likely to develop varicose veins. What are the signs or symptoms?  Bulging, twisted-appearing, bluish veins, most commonly found on the legs.  Leg pain or a feeling of heaviness. These symptoms may be worse at the end of the day.  Leg swelling.  Changes in skin color. How is this diagnosed? A health care provider can usually diagnose varicose veins by examining your legs. Your health care provider may also recommend an ultrasound of your leg veins. How is this treated? Most varicose veins can be treated at home.However, other treatments are available for people who have persistent symptoms or want to improve the cosmetic appearance of the varicose veins. These treatment options include:  Sclerotherapy. A solution is injected into the vein to close it off.  Laser treatment. A laser is used to heat the vein to close it off.  Radiofrequency vein ablation. An electrical current produced by radio waves is used to close off the vein.  Phlebectomy. The vein is surgically removed through small incisions made over the varicose vein.  Vein ligation and stripping. The vein is surgically removed through incisions made over the varicose vein after the vein has been tied (ligated). Follow these instructions at home:   Do not stand or sit in one position for long periods of time. Do not sit with your legs crossed. Rest with your  legs raised during the day.  Wear compression stockings as directed by your health care provider. These stockings help to prevent blood clots and reduce swelling in your legs.  Do not wear other tight, encircling garments around your legs, pelvis, or waist.  Walk as much as possible to increase blood flow.  Raise the foot of your bed at night with 2-inch blocks.  If you get a cut in the skin over the vein and the vein bleeds, lie down with your leg raised and press on it with a clean cloth until the bleeding stops. Then place a bandage (dressing) on the cut. See your health care provider if it continues to bleed. Contact a health care provider if:  The skin around your ankle starts to break down.  You have pain, redness, tenderness, or hard swelling in your leg over a vein.  You are uncomfortable because of leg pain. This information is not intended to replace advice given to you by your health care provider. Make sure you discuss any questions you have with your health care provider. Document Released: 02/17/2005 Document Revised: 10/16/2015 Document Reviewed: 11/11/2015 Elsevier Interactive Patient Education  2017 [employer]  

## 2018-04-03 NOTE — Progress Notes (Signed)
[facility]  Provider: Lanae Boast, FNP   Hypertension Follow Up Visit  SUBJECTIVE:  Erin House is a 67 y.o. female who  has a past medical history of Arthritis, Cancer ([place]) (2000), Diabetes mellitus without complication ([place]), Hyperlipemia, Hypertension, Sleep apnea, and Venous insufficiency of both lower extremities (12/2016).   Patient presents for follow up on chronic conditions. Patient states that she has mild edema of her lower extremities. Wears support hose with relief. She has been seen by Vascular and Vein and Cardiology for varicose veins and carotid bruits. Patient is doing well and denies problems or concerns today.   Current Outpatient Medications  Medication Sig Dispense Refill  . acetaminophen (TYLENOL) 500 MG tablet Take 1 tablet (500 mg total) by mouth every 6 (six) hours as needed. 30 tablet 0  . aspirin EC 81 MG tablet Take 1 tablet (81 mg total) by mouth daily. 90 tablet 03  . Blood Glucose Monitoring Suppl (ACCU-CHEK AVIVA PLUS) w/Device KIT 1 each by Does not apply route daily. 1 kit 0  . cetirizine (ZYRTEC) 10 MG tablet TAKE 1 TABLET BY MOUTH ONCE DAILY 30 tablet 11  . cyclobenzaprine (FLEXERIL) 5 MG tablet Take 1 tablet (5 mg total) by mouth at bedtime. 30 tablet 1  . furosemide (LASIX) 40 MG tablet Take 1 tablet (40 mg total) by mouth daily. 90 tablet 1  . glucose blood (ACCU-CHEK AVIVA) test strip Will check fasting blood sugar daily. Use as instructed 100 each 12  . liraglutide (VICTOZA) 18 MG/3ML SOPN 0.6 mg daily for 1 week. Week 2, increase to 1.2 mg daily 3 mL 12  . lisinopril (PRINIVIL,ZESTRIL) 20 MG tablet Take 1 tablet (20 mg total) by mouth daily. 90 tablet 3  . metFORMIN (GLUCOPHAGE) 1000 MG tablet Take 1 tablet (1,000 mg total) by mouth 2 (two) times daily with a meal. 180 tablet 3  . potassium chloride (K-DUR) 10 MEQ tablet Take 1 tablet (10 mEq total) by mouth daily. 90 tablet 1  .  rosuvastatin (CRESTOR) 10 MG tablet Take 1 tablet (10 mg total) by mouth daily. 90 tablet 1  . Vitamin D, Ergocalciferol, (DRISDOL) 50000 units CAPS capsule Take 1 capsule (50,000 Units total) by mouth once a week. (Patient not taking: Reported on 12/26/2017) 12 capsule 1   No current facility-administered medications for this visit.     Recent Results (from the past 2160 hour(s))  Glucose (CBG)     Status: Abnormal   Collection Time: 04/03/18  9:53 AM  Result Value Ref Range   POC Glucose 131 (A) 70 - 99 mg/dl    Hypertension ROS: Review of Systems  Constitutional: Negative.   HENT: Negative.   Eyes: Negative.   Respiratory: Negative.   Cardiovascular: Negative.   Gastrointestinal: Negative.   Genitourinary: Negative.   Musculoskeletal: Negative.   Skin: Negative.   Neurological: Negative.   Psychiatric/Behavioral: Negative.      OBJECTIVE:   BP 122/67 (BP Location: Right Arm, Patient Position: Sitting, Cuff Size: Large)   Pulse 67   Temp 97.7 F (36.5 C) (Oral)   Resp 16   Ht 5' 0.25" (1.53 m)   Wt 233 lb (105.7 kg)   SpO2 100%   BMI 45.13 kg/m   Physical Exam  Constitutional: She is oriented to person, place, and time and well-developed, well-nourished, and in no distress. No distress.  HENT:  Head: Normocephalic and atraumatic.  Eyes: Pupils are equal, round, and  reactive to light. Conjunctivae and EOM are normal.  Neck: Normal range of motion. Neck supple.  Cardiovascular: Normal rate, regular rhythm and intact distal pulses. Exam reveals no gallop and no friction rub.  No murmur heard. Pulmonary/Chest: Effort normal and breath sounds normal. No respiratory distress. She has no wheezes.  Abdominal: Soft. Bowel sounds are normal. There is no tenderness.  Musculoskeletal: Normal range of motion. She exhibits no edema or tenderness.  Lymphadenopathy:    She has no cervical adenopathy.  Neurological: She is alert and oriented to person, place, and time. Gait normal.    Skin: Skin is warm and dry.  Psychiatric: Mood, memory, affect and judgment normal.  Nursing note and vitals reviewed.    ASSESSMENT/PLAN:  1. Essential hypertension The current medical regimen is effective;  continue present plan and medications.  - Urinalysis Dipstick  2. Type 2 diabetes mellitus without complication, without long-term current use of insulin ([place]) Pending labs. Will adjust medications accordingly.   A1c is at goal at 6.4.  - HgB A1c - Glucose (CBG)  3. Obesity, morbid ([place]) The patient is asked to make an attempt to improve diet and exercise patterns to aid in medical management of this problem.   4. PVD (peripheral vascular disease) with claudication ([place]) Continue with support stockings.  - Comprehensive metabolic panel   The patient is asked to make an attempt to improve diet and exercise patterns to aid in medical management of this problem.  Return to care as scheduled and prn. Patient verbalized understanding and agreed with plan of care.    Ms. Doug Sou. Nathaneil Canary, FNP-BC [facility] [address] [place], [place] [address] [phone]

## 2018-04-04 LAB — COMPREHENSIVE METABOLIC PANEL
ALT: 26 IU/L (ref 0–32)
AST: 21 IU/L (ref 0–40)
Albumin/Globulin Ratio: 1.6 (ref 1.2–2.2)
Albumin: 4.1 g/dL (ref 3.6–4.8)
Alkaline Phosphatase: 69 IU/L (ref 39–117)
BUN/Creatinine Ratio: 25 (ref 12–28)
BUN: 18 mg/dL (ref 8–27)
Bilirubin Total: 0.7 mg/dL (ref 0.0–1.2)
CO2: 26 mmol/L (ref 20–29)
Calcium: 9.5 mg/dL (ref 8.7–10.3)
Chloride: 102 mmol/L (ref 96–106)
Creatinine, Ser: 0.73 mg/dL (ref 0.57–1.00)
GFR calc Af Amer: 97 mL/min/{1.73_m2} (ref >59)
GFR calc non Af Amer: 84 mL/min/{1.73_m2} (ref >59)
Globulin, Total: 2.5 g/dL (ref 1.5–4.5)
Glucose: 107 mg/dL — ABNORMAL HIGH (ref 65–99)
Potassium: 4.1 mmol/L (ref 3.5–5.2)
Sodium: 141 mmol/L (ref 134–144)
Total Protein: 6.6 g/dL (ref 6.0–8.5)

## 2018-06-30 ENCOUNTER — Other Ambulatory Visit: Payer: Self-pay | Admitting: Family Medicine

## 2018-06-30 DIAGNOSIS — Z1231 Encounter for screening mammogram for malignant neoplasm of breast: Secondary | ICD-10-CM

## 2018-07-05 ENCOUNTER — Encounter: Payer: Self-pay | Admitting: Family Medicine

## 2018-07-05 ENCOUNTER — Ambulatory Visit (INDEPENDENT_AMBULATORY_CARE_PROVIDER_SITE_OTHER): Payer: Medicare Other | Admitting: Family Medicine

## 2018-07-05 VITALS — BP 140/68 | HR 58 | Temp 97.8°F | Resp 16 | Ht 60.25 in | Wt 239.0 lb

## 2018-07-05 DIAGNOSIS — E785 Hyperlipidemia, unspecified: Secondary | ICD-10-CM | POA: Diagnosis not present

## 2018-07-05 DIAGNOSIS — I6523 Occlusion and stenosis of bilateral carotid arteries: Secondary | ICD-10-CM

## 2018-07-05 DIAGNOSIS — G4733 Obstructive sleep apnea (adult) (pediatric): Secondary | ICD-10-CM

## 2018-07-05 DIAGNOSIS — I739 Peripheral vascular disease, unspecified: Secondary | ICD-10-CM

## 2018-07-05 DIAGNOSIS — E119 Type 2 diabetes mellitus without complications: Secondary | ICD-10-CM

## 2018-07-05 DIAGNOSIS — I10 Essential (primary) hypertension: Secondary | ICD-10-CM | POA: Diagnosis not present

## 2018-07-05 MED ORDER — ROSUVASTATIN CALCIUM 20 MG PO TABS: 20.0000 mg | ORAL_TABLET | Freq: Every day | ORAL | 3 refills | Status: DC

## 2018-07-05 MED ORDER — OMEGA-3-ACID ETHYL ESTERS 1 G PO CAPS: 1.0000 g | ORAL_CAPSULE | Freq: Two times a day (BID) | ORAL | 3 refills | Status: DC

## 2018-07-05 NOTE — Patient Instructions (Signed)
Diabetes Mellitus and Standards of Medical Care Managing diabetes (diabetes mellitus) can be complicated. Your diabetes treatment may be managed by a team of health care providers, including:  A physician who specializes in diabetes (endocrinologist).  A nurse practitioner or physician assistant.  Nurses.  A diet and nutrition specialist (registered dietitian).  A certified diabetes educator (CDE).  An exercise specialist.  A pharmacist.  An eye doctor.  A foot specialist (podiatrist).  A dentist.  A primary care provider.  A mental health provider. Your health care providers follow guidelines to help you get the best quality of care. The following schedule is a general guideline for your diabetes management plan. Your health care providers may give you more specific instructions. Physical exams Upon being diagnosed with diabetes mellitus, and each year after that, your health care provider will ask about your medical and family history. He or she will also do a physical exam. Your exam may include:  Measuring your height, weight, and body mass index (BMI).  Checking your blood pressure. This will be done at every routine medical visit. Your target blood pressure may vary depending on your medical conditions, your age, and other factors.  Thyroid gland exam.  Skin exam.  Screening for damage to your nerves (peripheral neuropathy). This may include checking the pulse in your legs and feet and checking the level of sensation in your hands and feet.  A complete foot exam to inspect the structure and skin of your feet, including checking for cuts, bruises, redness, blisters, sores, or other problems.  Screening for blood vessel (vascular) problems, which may include checking the pulse in your legs and feet and checking your temperature. Blood tests Depending on your treatment plan and your personal needs, you may have the following tests done:  HbA1c (hemoglobin A1c). This  test provides information about blood sugar (glucose) control over the previous 2-3 months. It is used to adjust your treatment plan, if needed. This test will be done: ? At least 2 times a year, if you are meeting your treatment goals. ? 4 times a year, if you are not meeting your treatment goals or if treatment goals have changed.  Lipid testing, including total, LDL, and HDL cholesterol and triglyceride levels. ? The goal for LDL is less than 100 mg/dL (5.5 mmol/L). If you are at high risk for complications, the goal is less than 70 mg/dL (3.9 mmol/L). ? The goal for HDL is 40 mg/dL (2.2 mmol/L) or higher for men and 50 mg/dL (2.8 mmol/L) or higher for women. An HDL cholesterol of 60 mg/dL (3.3 mmol/L) or higher gives some protection against heart disease. ? The goal for triglycerides is less than 150 mg/dL (8.3 mmol/L).  Liver function tests.  Kidney function tests.  Thyroid function tests. Dental and eye exams  Visit your dentist two times a year.  If you have type 1 diabetes, your health care provider may recommend an eye exam 3-5 years after you are diagnosed, and then once a year after your first exam. ? For children with type 1 diabetes, a health care provider may recommend an eye exam when your child is age 10 or older and has had diabetes for 3-5 years. After the first exam, your child should get an eye exam once a year.  If you have type 2 diabetes, your health care provider may recommend an eye exam as soon as you are diagnosed, and then once a year after your first exam. Immunizations   The  yearly flu (influenza) vaccine is recommended for everyone 6 months or older who has diabetes.  The pneumonia (pneumococcal) vaccine is recommended for everyone 2 years or older who has diabetes. If you are 41 or older, you may get the pneumonia vaccine as a series of two separate shots.  The hepatitis B vaccine is recommended for adults shortly after being diagnosed with  diabetes.  Adults and children with diabetes should receive all other vaccines according to age-specific recommendations from the Centers for Disease Control and Prevention (CDC). Mental and emotional health Screening for symptoms of eating disorders, anxiety, and depression is recommended at the time of diagnosis and afterward as needed. If your screening shows that you have symptoms (positive screening result), you may need more evaluation and you may work with a mental health care provider. Treatment plan Your treatment plan will be reviewed at every medical visit. You and your health care provider will discuss:  How you are taking your medicines, including insulin.  Any side effects you are experiencing.  Your blood glucose target goals.  The frequency of your blood glucose monitoring.  Lifestyle habits, such as activity level as well as tobacco, alcohol, and substance use. Diabetes self-management education Your health care provider will assess how well you are monitoring your blood glucose levels and whether you are taking your insulin correctly. He or she may refer you to:  A certified diabetes educator to manage your diabetes throughout your life, starting at diagnosis.  A registered dietitian who can create or review your personal nutrition plan.  An exercise specialist who can discuss your activity level and exercise plan. Summary  Managing diabetes (diabetes mellitus) can be complicated. Your diabetes treatment may be managed by a team of health care providers.  Your health care providers follow guidelines in order to help you get the best quality of care.  Standards of care including having regular physical exams, blood tests, blood pressure monitoring, immunizations, screening tests, and education about how to manage your diabetes.  Your health care providers may also give you more specific instructions based on your individual health. This information is not intended  to replace advice given to you by your health care provider. Make sure you discuss any questions you have with your health care provider. Document Released: 03/07/2009 Document Revised: 01/27/2018 Document Reviewed: 02/06/2016 Elsevier Interactive Patient Education  2019 Elsevier Inc.   Carotid Artery Disease  The carotid arteries are arteries on both sides of the neck. They carry blood to the brain, face, and neck. Carotid artery disease happens when these arteries become smaller (narrow) or get blocked. If these arteries become smaller or get blocked, you are more likely to have a stroke or a warning stroke (transient ischemic attack). Follow these instructions at home:  Take over-the-counter and prescription medicines only as told by your doctor.  Make sure you understand all instructions about your medicines. Do not stop taking your medicines without talking to your doctor first.  Follow your doctor's diet instructions. It is important to follow a healthy diet. ? Eat foods that include plenty of: ? Fresh fruits. ? Vegetables. ? Lean meats. ? Avoid these foods: ? Foods that are high in fat. ? Foods that are high in salt (sodium). ? Foods that are fried. ? Foods that are processed. ? Foods that have few good nutrients (poor nutritional value).  Keep a healthy weight.  Stay active. Get at least 30 minutes of activity every day.  Do not smoke.  Limit  alcohol use to: ? No more than 2 drinks a day for men. ? No more than 1 drink a day for women who are not pregnant.  Do not use illegal drugs.  Keep all follow-up visits as told by your doctor. This is important. Contact a doctor if: Get help right away if:  You have any symptoms of stroke or TIA. The acronym BEFAST is an easy way to remember the main warning signs of stroke. ? B = Balance problems. Signs include dizziness, sudden trouble walking, or loss of balance ? E = Eye problems. This includes trouble seeing or a sudden  change in vision. ? F = Face changes. This includes sudden weakness or numbness of the face, or the face or eyelid drooping to one side. ? A = Arm weakness or numbness. This happens suddenly and usually on one side of the body. ? S = Speech problems. This includes trouble speaking or trouble understanding. ? T = Time. Time to call 911 or seek emergency care. Do not wait to see if symptoms go away. Make note of the time your symptoms started.  Other signs of stroke may include: ? A sudden, severe headache with no known cause. ? Feeling sick to your stomach (nauseous) or throwing up (vomiting). ? Seizure. Call your local emergency services (911 in [place] Do notdrive yourself to the clinic or hospital. Summary  The carotid arteries are arteries on both sides of the neck.  If these arteries get smaller or get blocked, you are more likely to have a stroke or a warning stroke (transient ischemic attack).  Take over-the-counter and prescription medicines only as told by your doctor.  Keep all follow-up visits as told by your doctor. This is important. This information is not intended to replace advice given to you by your health care provider. Make sure you discuss any questions you have with your health care provider. Document Released: 04/26/2012 Document Revised: 05/05/2017 Document Reviewed: 05/05/2017 Elsevier Interactive Patient Education  2019 [employer].

## 2018-07-05 NOTE — Progress Notes (Signed)
[facility]   Progress Note: General Provider: Lanae Boast, FNP  SUBJECTIVE:   Erin House is a 68 y.o. female who  has a past medical history of Arthritis, Cancer ([place]) (2000), Diabetes mellitus without complication ([place]), Hyperlipemia, Hypertension, Sleep apnea, and Venous insufficiency of both lower extremities (12/2016).. Patient presents today for Hyperlipidemia and Diabetes  Patient reports compliance with all medications. Denies exercise regimen and does not follow a carb modified diet.   Review of Systems  Constitutional: Negative.   HENT: Negative.   Eyes: Negative.   Respiratory: Negative.   Cardiovascular: Negative.   Gastrointestinal: Negative.   Genitourinary: Negative.   Musculoskeletal: Negative.   Skin: Negative.   Neurological: Negative.   Psychiatric/Behavioral: Negative.      OBJECTIVE: BP 140/68 (BP Location: Right Arm, Patient Position: Sitting, Cuff Size: Large)   Pulse (!) 58   Temp 97.8 F (36.6 C) (Oral)   Resp 16   Ht 5' 0.25" (1.53 m)   Wt 239 lb (108.4 kg)   SpO2 100%   BMI 46.29 kg/m   Wt Readings from Last 3 Encounters:  07/05/18 239 lb (108.4 kg)  04/03/18 233 lb (105.7 kg)  12/26/17 228 lb (103.4 kg)     Physical Exam Vitals signs and nursing note reviewed.  Constitutional:      General: She is not in acute distress.    Appearance: She is well-developed.  HENT:     Head: Normocephalic and atraumatic.  Eyes:     Conjunctiva/sclera: Conjunctivae normal.     Pupils: Pupils are equal, round, and reactive to light.  Neck:     Musculoskeletal: Normal range of motion.  Cardiovascular:     Rate and Rhythm: Normal rate and regular rhythm.     Heart sounds: Normal heart sounds.  Pulmonary:     Effort: Pulmonary effort is normal. No respiratory distress.     Breath sounds: Normal breath sounds.  Abdominal:     General: Bowel sounds are normal. There is no distension.   Palpations: Abdomen is soft.  Musculoskeletal: Normal range of motion.  Skin:    General: Skin is warm and dry.  Neurological:     Mental Status: She is alert and oriented to person, place, and time.  Psychiatric:        Behavior: Behavior normal.        Thought Content: Thought content normal.     ASSESSMENT/PLAN:   1. PVD (peripheral vascular disease) with claudication ([place]) Reviewed dopplers with patient today. Discussed stenosis of carotids and insuffeciency of lower extremities.   2. Essential hypertension   3. Hyperlipidemia, unspecified hyperlipidemia type Discussed risks of uncontrolled hyperlipidemia. Educated on losing weight and exercising  - Lipid Panel - omega-3 acid ethyl esters (LOVAZA) 1 g capsule; Take 1 capsule (1 g total) by mouth 2 (two) times daily.  Dispense: 180 capsule; Refill: 3 - rosuvastatin (CRESTOR) 20 MG tablet; Take 1 tablet (20 mg total) by mouth daily.  Dispense: 90 tablet; Refill: 3  4. Obstructive sleep apnea Non compliant with CPAP use. Encouraged to use nightly.   5. Carotid stenosis, asymptomatic, bilateral Discussed the importance of maintaining healthy weight and cholesterol.  - Lipid Panel - omega-3 acid ethyl esters (LOVAZA) 1 g capsule; Take 1 capsule (1 g total) by mouth 2 (two) times daily.  Dispense: 180 capsule; Refill: 3 - rosuvastatin (CRESTOR) 20 MG tablet; Take 1 tablet (20 mg total) by mouth daily.  Dispense:  90 tablet; Refill: 3  6. Obesity, morbid ([place]) The patient is asked to make an attempt to improve diet and exercise patterns to aid in medical management of this problem.  - Lipid Panel  7. Type 2 diabetes mellitus without complication, without long-term current use of insulin ([place]) The patient is asked to make an attempt to improve diet and exercise patterns to aid in medical management of this problem.    Return in about 3 months (around 10/03/2018) for Dm.    The patient was given clear instructions to go to ER  or return to medical center if symptoms do not improve, worsen or new problems develop. The patient verbalized understanding and agreed with plan of care.   Ms. Doug Sou. Nathaneil Canary, FNP-BC [facility] [address] [place], [place] [address] [phone]

## 2018-07-06 LAB — LIPID PANEL
Chol/HDL Ratio: 3.1 ratio (ref 0.0–4.4)
Cholesterol, Total: 181 mg/dL (ref 100–199)
HDL: 59 mg/dL (ref >39)
LDL Calculated: 81 mg/dL (ref 0–99)
Triglycerides: 204 mg/dL — ABNORMAL HIGH (ref 0–149)
VLDL Cholesterol Cal: 41 mg/dL — ABNORMAL HIGH (ref 5–40)

## 2018-07-21 ENCOUNTER — Encounter: Payer: Self-pay | Admitting: Family Medicine

## 2018-08-16 ENCOUNTER — Ambulatory Visit: Payer: Medicare Other

## 2018-08-17 ENCOUNTER — Ambulatory Visit: Payer: Medicare Other

## 2018-09-25 ENCOUNTER — Ambulatory Visit: Payer: Medicare Other

## 2018-10-04 ENCOUNTER — Other Ambulatory Visit: Payer: Self-pay

## 2018-10-04 ENCOUNTER — Ambulatory Visit (INDEPENDENT_AMBULATORY_CARE_PROVIDER_SITE_OTHER): Payer: Medicare Other | Admitting: Family Medicine

## 2018-10-04 ENCOUNTER — Encounter: Payer: Self-pay | Admitting: Family Medicine

## 2018-10-04 ENCOUNTER — Encounter: Payer: Self-pay | Admitting: Podiatry

## 2018-10-04 ENCOUNTER — Ambulatory Visit: Payer: Medicare Other | Admitting: Podiatry

## 2018-10-04 ENCOUNTER — Ambulatory Visit (INDEPENDENT_AMBULATORY_CARE_PROVIDER_SITE_OTHER): Payer: Medicare Other

## 2018-10-04 ENCOUNTER — Other Ambulatory Visit: Payer: Self-pay | Admitting: Podiatry

## 2018-10-04 VITALS — Temp 97.7°F

## 2018-10-04 DIAGNOSIS — M76822 Posterior tibial tendinitis, left leg: Secondary | ICD-10-CM

## 2018-10-04 DIAGNOSIS — M722 Plantar fascial fibromatosis: Secondary | ICD-10-CM

## 2018-10-04 DIAGNOSIS — B351 Tinea unguium: Secondary | ICD-10-CM | POA: Diagnosis not present

## 2018-10-04 DIAGNOSIS — E785 Hyperlipidemia, unspecified: Secondary | ICD-10-CM | POA: Diagnosis not present

## 2018-10-04 DIAGNOSIS — I10 Essential (primary) hypertension: Secondary | ICD-10-CM | POA: Diagnosis not present

## 2018-10-04 DIAGNOSIS — E119 Type 2 diabetes mellitus without complications: Secondary | ICD-10-CM | POA: Diagnosis not present

## 2018-10-04 DIAGNOSIS — M79676 Pain in unspecified toe(s): Secondary | ICD-10-CM

## 2018-10-04 DIAGNOSIS — I739 Peripheral vascular disease, unspecified: Secondary | ICD-10-CM

## 2018-10-04 MED ORDER — MELOXICAM 15 MG PO TABS: 15.0000 mg | ORAL_TABLET | Freq: Every day | ORAL | 1 refills | Status: DC

## 2018-10-04 NOTE — Progress Notes (Signed)
  [facility]  Virtual Visit via Telephone Note  I connected with [name] on 10/04/18 at 10:20 AM EDT by telephone and verified that I am speaking with the correct person using two identifiers.   I discussed the limitations, risks, security and privacy concerns of performing an evaluation and management service by telephone and the availability of in person appointments. I also discussed with the patient that there may be a patient responsible charge related to this service. The patient expressed understanding and agreed to proceed.   History of Present Illness: Erin House  has a past medical history of Arthritis, Cancer ([place]) (2000), Diabetes mellitus without complication ([place]), Hyperlipemia, Hypertension, Sleep apnea, and Venous insufficiency of both lower extremities (12/2016). Patient states that she was seen this morning in podiatry and given ankle brace for left tendonitis. She does not exercise, eat a heart healthy diet or check CBG on a regular basis. She denies CP, SOB, Dizziness or leg swelling.    Observations/Objective: Patient with regular voice tone, rate and rhythm. Speaking calmly and is in no apparent distress.    Assessment and Plan: 1. PVD (peripheral vascular disease) with claudication ([place]) 2. Diabetes mellitus without complication ([place])  3. Essential hypertension  4. Hyperlipidemia, unspecified hyperlipidemia type   No medication changes warranted at the present time.    Follow Up Instructions:  We discussed hand washing, using hand sanitizer when soap and water are not available, only going out when absolutely necessary, and social distancing. Explained to patient that she is immunocompromised and will need to take precautions during this time.   I discussed the assessment and treatment plan with the patient. The patient was provided an opportunity to ask questions and all were answered. The patient  agreed with the plan and demonstrated an understanding of the instructions.   The patient was advised to call back or seek an in-person evaluation if the symptoms worsen or if the condition fails to improve as anticipated.  I provided 10 minutes of non-face-to-face time during this encounter.  [name], FNP-BC [facility] [address] [place], [place] [address] [phone]

## 2018-10-08 NOTE — Progress Notes (Signed)
   SUBJECTIVE Patient with a history of diabetes mellitus presents to office today complaining of elongated, thickened nails that cause pain while ambulating in shoes. She is unable to trim her own nails.  She also complains of intermittent sharp, shooting pain to the medial and plantar aspects of the left heel as well as the ankle that began about one month ago. She states the pain is sometimes constant. She reports associated swelling. Applying pressure to the left foot and ankle increases the pain. She has not done anything for treatment yet. Patient is here for further evaluation and treatment.   Past Medical History:  Diagnosis Date  . Arthritis    knees  . 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 Diabetes mellitus without complication ([place])   . Hyperlipemia   . Hypertension   . Sleep apnea    has CPAP but does not wear every night  . Venous insufficiency of both lower extremities 12/2016   Bilateral femoral and small saphenous vein incompetence.  Right saphenofemoral junction and right greater saphenous vein incompetence.    OBJECTIVE General Patient is awake, alert, and oriented x 3 and in no acute distress. Derm Skin is dry and supple bilateral. Negative open lesions or macerations. Remaining integument unremarkable. Nails are tender, long, thickened and dystrophic with subungual debris, consistent with onychomycosis, 1-5 bilateral. No signs of infection noted. Vasc  DP and PT pedal pulses palpable bilaterally. Temperature gradient within normal limits.  Neuro Epicritic and protective threshold sensation diminished bilaterally.  Musculoskeletal Exam Pain on palpation noted to the posterior tibial tendon of the left foot. No symptomatic pedal deformities noted bilateral. Muscular strength within normal limits.  Radiographic Exam:  Normal osseous mineralization. Joint spaces preserved. No fracture or dislocation identified.   ASSESSMENT 1. Diabetes Mellitus w/ peripheral neuropathy  2. Onychomycosis of nail due to dermatophyte bilateral 3. Posterior tibial tendinitis left  PLAN OF CARE 1. Patient evaluated today. X-Rays reviewed.  2. Instructed to maintain good pedal hygiene and foot care. Stressed importance of controlling blood sugar.  3. Mechanical debridement of nails 1-5 bilaterally performed using a nail nipper. Filed with dremel without incident.  4. Declined injection.  5. Plantar fascial brace dispensed.  6. Prescription for Meloxicam provided to patient. 7. Return to clinic in 3 months for routine care.     Edrick Kins, DPM [facility]  Dr. Edrick Kins, [place]                                        [place], [place] [address]                Office [phone]  Fax [phone]

## 2018-10-09 ENCOUNTER — Other Ambulatory Visit: Payer: Medicare Other

## 2018-10-13 ENCOUNTER — Other Ambulatory Visit: Payer: Self-pay

## 2018-10-13 ENCOUNTER — Other Ambulatory Visit (INDEPENDENT_AMBULATORY_CARE_PROVIDER_SITE_OTHER): Payer: Medicare Other

## 2018-10-13 DIAGNOSIS — I739 Peripheral vascular disease, unspecified: Secondary | ICD-10-CM

## 2018-10-13 DIAGNOSIS — E119 Type 2 diabetes mellitus without complications: Secondary | ICD-10-CM | POA: Diagnosis not present

## 2018-10-13 DIAGNOSIS — I10 Essential (primary) hypertension: Secondary | ICD-10-CM

## 2018-10-13 DIAGNOSIS — E785 Hyperlipidemia, unspecified: Secondary | ICD-10-CM

## 2018-10-13 LAB — POCT GLYCOSYLATED HEMOGLOBIN (HGB A1C): Hemoglobin A1C: 6.9 % — AB (ref 4.0–5.6)

## 2018-10-14 LAB — COMPREHENSIVE METABOLIC PANEL
ALT: 24 IU/L (ref 0–32)
AST: 19 IU/L (ref 0–40)
Albumin/Globulin Ratio: 1.7 (ref 1.2–2.2)
Albumin: 3.8 g/dL (ref 3.8–4.8)
Alkaline Phosphatase: 81 IU/L (ref 39–117)
BUN/Creatinine Ratio: 25 (ref 12–28)
BUN: 18 mg/dL (ref 8–27)
Bilirubin Total: 0.9 mg/dL (ref 0.0–1.2)
CO2: 28 mmol/L (ref 20–29)
Calcium: 9.1 mg/dL (ref 8.7–10.3)
Chloride: 104 mmol/L (ref 96–106)
Creatinine, Ser: 0.73 mg/dL (ref 0.57–1.00)
GFR calc Af Amer: 96 mL/min/{1.73_m2} (ref >59)
GFR calc non Af Amer: 84 mL/min/{1.73_m2} (ref >59)
Globulin, Total: 2.3 g/dL (ref 1.5–4.5)
Glucose: 139 mg/dL — ABNORMAL HIGH (ref 65–99)
Potassium: 4.2 mmol/L (ref 3.5–5.2)
Sodium: 139 mmol/L (ref 134–144)
Total Protein: 6.1 g/dL (ref 6.0–8.5)

## 2018-10-14 LAB — LIPID PANEL
Chol/HDL Ratio: 2.7 ratio (ref 0.0–4.4)
Cholesterol, Total: 142 mg/dL (ref 100–199)
HDL: 53 mg/dL (ref >39)
LDL Calculated: 62 mg/dL (ref 0–99)
Triglycerides: 134 mg/dL (ref 0–149)
VLDL Cholesterol Cal: 27 mg/dL (ref 5–40)

## 2018-10-14 LAB — TSH: TSH: 0.588 u[IU]/mL (ref 0.450–4.500)

## 2018-10-18 ENCOUNTER — Telehealth: Payer: Self-pay

## 2018-10-18 NOTE — Telephone Encounter (Signed)
Called, no answer and no voicemail picked up. Will try later.

## 2018-10-18 NOTE — Telephone Encounter (Signed)
-----   Message from Lanae Boast, [place] sent at 10/18/2018  9:56 AM EDT ----- The  labs are stable without significant clinical change.  All other results are normal or within acceptable limits. No Medication changes at the present time. Please return for your follow up appts.

## 2018-10-18 NOTE — Telephone Encounter (Signed)
Called and spoke with patient advised that labs are within acceptable limits and no mediation changes are being made at this time. Advised to keep next scheduled appointment. Patient verbalized understanding. Thanks !

## 2018-10-18 NOTE — Progress Notes (Signed)
The  labs are stable without significant clinical change.  All other results are normal or within acceptable limits. No Medication changes at the present time. Please return for your follow up appts.

## 2018-11-06 ENCOUNTER — Other Ambulatory Visit: Payer: Self-pay | Admitting: Family Medicine

## 2018-11-06 ENCOUNTER — Ambulatory Visit
Admission: RE | Admit: 2018-11-06 | Discharge: 2018-11-06 | Disposition: A | Discharge status: Home / Self Care | Payer: Medicare Other | Source: Ambulatory Visit | Attending: Family Medicine | Admitting: Family Medicine

## 2018-11-06 ENCOUNTER — Other Ambulatory Visit: Payer: Self-pay

## 2018-11-06 DIAGNOSIS — Z1231 Encounter for screening mammogram for malignant neoplasm of breast: Secondary | ICD-10-CM

## 2018-11-15 ENCOUNTER — Other Ambulatory Visit: Payer: Self-pay | Admitting: Family Medicine

## 2018-11-15 DIAGNOSIS — IMO0001 Reserved for inherently not codable concepts without codable children: Secondary | ICD-10-CM

## 2018-11-27 ENCOUNTER — Other Ambulatory Visit: Payer: Self-pay | Admitting: Family Medicine

## 2018-11-27 DIAGNOSIS — R0982 Postnasal drip: Secondary | ICD-10-CM

## 2018-12-07 ENCOUNTER — Encounter (HOSPITAL_COMMUNITY): Payer: Self-pay | Admitting: Ophthalmology

## 2018-12-27 ENCOUNTER — Encounter (HOSPITAL_COMMUNITY): Payer: Self-pay

## 2018-12-27 ENCOUNTER — Emergency Department (HOSPITAL_COMMUNITY)
Admission: EM | Admit: 2018-12-27 | Discharge: 2018-12-27 | Discharge status: Absent without leave | Payer: Medicare Other

## 2018-12-27 ENCOUNTER — Ambulatory Visit (HOSPITAL_COMMUNITY)
Admission: RE | Admit: 2018-12-27 | Discharge: 2018-12-27 | Disposition: A | Discharge status: Home / Self Care | Payer: Medicare Other | Source: Ambulatory Visit | Attending: Family Medicine | Admitting: Family Medicine

## 2018-12-27 ENCOUNTER — Other Ambulatory Visit: Payer: Self-pay

## 2018-12-27 ENCOUNTER — Telehealth: Payer: Self-pay | Admitting: Family Medicine

## 2018-12-27 ENCOUNTER — Encounter: Payer: Self-pay | Admitting: Family Medicine

## 2018-12-27 ENCOUNTER — Ambulatory Visit (INDEPENDENT_AMBULATORY_CARE_PROVIDER_SITE_OTHER): Payer: Medicare Other | Admitting: Family Medicine

## 2018-12-27 VITALS — BP 137/65 | HR 85 | Temp 98.0°F | Resp 18 | Ht 60.25 in | Wt 240.0 lb

## 2018-12-27 DIAGNOSIS — E785 Hyperlipidemia, unspecified: Secondary | ICD-10-CM

## 2018-12-27 DIAGNOSIS — M79604 Pain in right leg: Secondary | ICD-10-CM

## 2018-12-27 DIAGNOSIS — I739 Peripheral vascular disease, unspecified: Secondary | ICD-10-CM

## 2018-12-27 DIAGNOSIS — M25562 Pain in left knee: Secondary | ICD-10-CM

## 2018-12-27 DIAGNOSIS — M25561 Pain in right knee: Secondary | ICD-10-CM | POA: Diagnosis not present

## 2018-12-27 DIAGNOSIS — I10 Essential (primary) hypertension: Secondary | ICD-10-CM | POA: Diagnosis not present

## 2018-12-27 DIAGNOSIS — I83893 Varicose veins of bilateral lower extremities with other complications: Secondary | ICD-10-CM

## 2018-12-27 MED ORDER — NONFORMULARY OR COMPOUNDED ITEM: Status: DC

## 2018-12-27 MED ORDER — DICLOFENAC SODIUM 1 % TD GEL: 2.0000 g | Freq: Four times a day (QID) | TRANSDERMAL | 0 refills | Status: DC

## 2018-12-27 NOTE — Progress Notes (Signed)
Patient [facility]   Progress Note: General Provider: Lanae Boast, FNP  SUBJECTIVE:   Erin House is a 68 y.o. female who  has a past medical history of Arthritis, Cancer ([place]) (2000), Diabetes mellitus without complication ([place]), Hyperlipemia, Hypertension, Sleep apnea, and Venous insufficiency of both lower extremities (12/2016).. Patient presents today for Leg Pain (right leg pain due to fall on 12/21/2018 ) Patient with a fall on 12/21/2018. Patient states that she slipped on a wet floor after mopping. Landed on her right leg and hit left knee. She reports feeling a lump in upper right thigh and is having difficulty with sitting on the right side.  Patient reports compliance with medications. She denies side effects. She does not exercise on a regular basis or follow a low sodium, low carb diet.    Review of Systems  Constitutional: Negative.   HENT: Negative.   Eyes: Negative.   Respiratory: Negative.   Cardiovascular: Negative.   Gastrointestinal: Negative.   Genitourinary: Negative.   Musculoskeletal: Positive for joint pain and myalgias.  Skin: Negative.   Neurological: Negative.   Psychiatric/Behavioral: Negative.      OBJECTIVE: BP 137/65 (BP Location: Right Arm, Patient Position: Sitting, Cuff Size: Large)   Pulse 85   Temp 98 F (36.7 C) (Oral)   Resp 18   Ht 5' 0.25" (1.53 m)   Wt 240 lb (108.9 kg)   SpO2 99%   BMI 46.48 kg/m   Wt Readings from Last 3 Encounters:  12/27/18 240 lb (108.9 kg)  07/05/18 239 lb (108.4 kg)  04/03/18 233 lb (105.7 kg)     Physical Exam Vitals signs and nursing note reviewed.  Constitutional:      General: She is not in acute distress.    Appearance: Normal appearance.  HENT:     Head: Normocephalic and atraumatic.  Eyes:     Extraocular Movements: Extraocular movements intact.     Conjunctiva/sclera: Conjunctivae normal.     Pupils: Pupils are equal, round, and reactive to  light.  Cardiovascular:     Rate and Rhythm: Normal rate and regular rhythm.     Heart sounds: No murmur.  Pulmonary:     Effort: Pulmonary effort is normal.     Breath sounds: Normal breath sounds.  Musculoskeletal: Normal range of motion.  Skin:    General: Skin is warm and dry.     Findings: Bruising (see pictures) present.  Neurological:     Mental Status: She is alert and oriented to person, place, and time.  Psychiatric:        Mood and Affect: Mood normal.        Behavior: Behavior normal.        Thought Content: Thought content normal.        Judgment: Judgment normal.         ASSESSMENT/PLAN:   1. Pain in right leg Patient requesting xray of the area.  - DG FEMUR, MIN 2 VIEWS RIGHT; Future - diclofenac sodium (VOLTAREN) 1 % GEL; Apply 2 g topically 4 (four) times daily.  Dispense: 350 g; Refill: 0 - NONFORMULARY OR COMPOUNDED ITEM; Baclofen 2%, cyclobenzaprine% diclofenac 3% and lidocaine2%  Dispense:   Patient with baseline ROM. Advised to use analgesic cream to the area. Will send in script for compounded cream.  2. Acute pain of both knees Bilateral knee pain s/p fall.  - DG Knee Complete 4 Views Left; Future - DG Knee Complete 4  Views Right; Future  3. Essential hypertension  4. PVD (peripheral vascular disease) with claudication (HCC)  5. Varicose veins of both legs with edema  6. Hyperlipidemia, unspecified hyperlipidemia type No medication changes warranted at the present time.     Return in about 3 months (around 03/29/2019) for HTN DM.    The patient was given clear instructions to go to ER or return to medical center if symptoms do not improve, worsen or new problems develop. The patient verbalized understanding and agreed with plan of care.   Ms. Doug Sou. Nathaneil Canary, FNP-BC Patient [facility] [address] [place], [place] [address] [phone]

## 2018-12-27 NOTE — Telephone Encounter (Signed)
Called back and spoke with patient, advised that we will need to see her and evaluate her to order xray. She was transferred to the front to schedule an appointment.

## 2018-12-27 NOTE — Patient Instructions (Signed)
Health Maintenance After Age 70 After age 70, you are at a higher risk for certain long-term diseases and infections as well as injuries from falls. Falls are a major cause of broken bones and head injuries in people who are older than age 70. Getting regular preventive care can help to keep you healthy and well. Preventive care includes getting regular testing and making lifestyle changes as recommended by your health care provider. Talk with your health care provider about:  Which screenings and tests you should have. A screening is a test that checks for a disease when you have no symptoms.  A diet and exercise plan that is right for you. What should I know about screenings and tests to prevent falls? Screening and testing are the best ways to find a health problem early. Early diagnosis and treatment give you the best chance of managing medical conditions that are common after age 70. Certain conditions and lifestyle choices may make you more likely to have a fall. Your health care provider may recommend:  Regular vision checks. Poor vision and conditions such as cataracts can make you more likely to have a fall. If you wear glasses, make sure to get your prescription updated if your vision changes.  Medicine review. Work with your health care provider to regularly review all of the medicines you are taking, including over-the-counter medicines. Ask your health care provider about any side effects that may make you more likely to have a fall. Tell your health care provider if any medicines that you take make you feel dizzy or sleepy.  Osteoporosis screening. Osteoporosis is a condition that causes the bones to get weaker. This can make the bones weak and cause them to break more easily.  Blood pressure screening. Blood pressure changes and medicines to control blood pressure can make you feel dizzy.  Strength and balance checks. Your health care provider may recommend certain tests to check your  strength and balance while standing, walking, or changing positions.  Foot health exam. Foot pain and numbness, as well as not wearing proper footwear, can make you more likely to have a fall.  Depression screening. You may be more likely to have a fall if you have a fear of falling, feel emotionally low, or feel unable to do activities that you used to do.  Alcohol use screening. Using too much alcohol can affect your balance and may make you more likely to have a fall. What actions can I take to lower my risk of falls? General instructions  Talk with your health care provider about your risks for falling. Tell your health care provider if: ? You fall. Be sure to tell your health care provider about all falls, even ones that seem minor. ? You feel dizzy, sleepy, or off-balance.  Take over-the-counter and prescription medicines only as told by your health care provider. These include any supplements.  Eat a healthy diet and maintain a healthy weight. A healthy diet includes low-fat dairy products, low-fat (lean) meats, and fiber from whole grains, beans, and lots of fruits and vegetables. Home safety  Remove any tripping hazards, such as rugs, cords, and clutter.  Install safety equipment such as grab bars in bathrooms and safety rails on stairs.  Keep rooms and walkways well-lit. Activity   Follow a regular exercise program to stay fit. This will help you maintain your balance. Ask your health care provider what types of exercise are appropriate for you.  If you need a cane or   walker, use it as recommended by your health care provider.  Wear supportive shoes that have nonskid soles. Lifestyle  Do not drink alcohol if your health care provider tells you not to drink.  If you drink alcohol, limit how much you have: ? 0-1 drink a day for women. ? 0-2 drinks a day for men.  Be aware of how much alcohol is in your drink. In the [place] one drink equals one typical bottle of beer (12  oz), one-half glass of wine (5 oz), or one shot of hard liquor (1 oz).  Do not use any products that contain nicotine or tobacco, such as cigarettes and e-cigarettes. If you need help quitting, ask your health care provider. Summary  Having a healthy lifestyle and getting preventive care can help to protect your health and wellness after age 1.  Screening and testing are the best way to find a health problem early and help you avoid having a fall. Early diagnosis and treatment give you the best chance for managing medical conditions that are more common for people who are older than age 3.  Falls are a major cause of broken bones and head injuries in people who are older than age 46. Take precautions to prevent a fall at home.  Work with your health care provider to learn what changes you can make to improve your health and wellness and to prevent falls. This information is not intended to replace advice given to you by your health care provider. Make sure you discuss any questions you have with your health care provider. Document Released: 03/23/2017 Document Revised: 08/31/2018 Document Reviewed: 03/23/2017 Elsevier Patient Education  2020 [place] in the Home, Adult Falls can cause injuries. They can happen to people of all ages. There are many things you can do to make your home safe and to help prevent falls. Ask for help when making these changes, if needed. What actions can I take to prevent falls? General Instructions  Use good lighting in all rooms. Replace any light bulbs that burn out.  Turn on the lights when you go into a dark area. Use night-lights.  Keep items that you use often in easy-to-reach places. Lower the shelves around your home if necessary.  Set up your furniture so you have a clear path. Avoid moving your furniture around.  Do not have throw rugs and other things on the floor that can make you trip.  Avoid walking on wet floors.  If  any of your floors are uneven, fix them.  Add color or contrast paint or tape to clearly mark and help you see: ? Any grab bars or handrails. ? First and last steps of stairways. ? Where the edge of each step is.  If you use a stepladder: ? Make sure that it is fully opened. Do not climb a closed stepladder. ? Make sure that both sides of the stepladder are locked into place. ? Ask someone to hold the stepladder for you while you use it.  If there are any pets around you, be aware of where they are. What can I do in the bathroom?      Keep the floor dry. Clean up any water that spills onto the floor as soon as it happens.  Remove soap buildup in the tub or shower regularly.  Use non-skid mats or decals on the floor of the tub or shower.  Attach bath mats securely with double-sided, non-slip rug tape.  If you  need to sit down in the shower, use a plastic, non-slip stool.  Install grab bars by the toilet and in the tub and shower. Do not use towel bars as grab bars. What can I do in the bedroom?  Make sure that you have a light by your bed that is easy to reach.  Do not use any sheets or blankets that are too big for your bed. They should not hang down onto the floor.  Have a firm chair that has side arms. You can use this for support while you get dressed. What can I do in the kitchen?  Clean up any spills right away.  If you need to reach something above you, use a strong step stool that has a grab bar.  Keep electrical cords out of the way.  Do not use floor polish or wax that makes floors slippery. If you must use wax, use non-skid floor wax. What can I do with my stairs?  Do not leave any items on the stairs.  Make sure that you have a light switch at the top of the stairs and the bottom of the stairs. If you do not have them, ask someone to add them for you.  Make sure that there are handrails on both sides of the stairs, and use them. Fix handrails that are  broken or loose. Make sure that handrails are as long as the stairways.  Install non-slip stair treads on all stairs in your home.  Avoid having throw rugs at the top or bottom of the stairs. If you do have throw rugs, attach them to the floor with carpet tape.  Choose a carpet that does not hide the edge of the steps on the stairway.  Check any carpeting to make sure that it is firmly attached to the stairs. Fix any carpet that is loose or worn. What can I do on the outside of my home?  Use bright outdoor lighting.  Regularly fix the edges of walkways and driveways and fix any cracks.  Remove anything that might make you trip as you walk through a door, such as a raised step or threshold.  Trim any bushes or trees on the path to your home.  Regularly check to see if handrails are loose or broken. Make sure that both sides of any steps have handrails.  Install guardrails along the edges of any raised decks and porches.  Clear walking paths of anything that might make someone trip, such as tools or rocks.  Have any leaves, snow, or ice cleared regularly.  Use sand or salt on walking paths during winter.  Clean up any spills in your garage right away. This includes grease or oil spills. What other actions can I take?  Wear shoes that: ? Have a low heel. Do not wear high heels. ? Have rubber bottoms. ? Are comfortable and fit you well. ? Are closed at the toe. Do not wear open-toe sandals.  Use tools that help you move around (mobility aids) if they are needed. These include: ? Canes. ? Walkers. ? Scooters. ? Crutches.  Review your medicines with your doctor. Some medicines can make you feel dizzy. This can increase your chance of falling. Ask your doctor what other things you can do to help prevent falls. Where to find more information  Centers for Disease Control and Prevention, [facility]AD[employer]: https://garcia.biz/  [employer] on Aging: BrainJudge.co.uk Contact  a doctor if:  You are afraid of falling at home.  You feel weak, drowsy, or dizzy at home.  You fall at home. Summary  There are many simple things that you can do to make your home safe and to help prevent falls.  Ways to make your home safe include removing tripping hazards and installing grab bars in the bathroom.  Ask for help when making these changes in your home. This information is not intended to replace advice given to you by your health care provider. Make sure you discuss any questions you have with your health care provider. Document Released: 03/06/2009 Document Revised: 08/31/2018 Document Reviewed: 12/23/2016 Elsevier Patient Education  2020 [employer].

## 2018-12-27 NOTE — Telephone Encounter (Signed)
Erin House.

## 2018-12-28 ENCOUNTER — Ambulatory Visit (HOSPITAL_COMMUNITY)
Admission: RE | Admit: 2018-12-28 | Discharge: 2018-12-28 | Disposition: A | Discharge status: Home / Self Care | Payer: Medicare Other | Source: Ambulatory Visit | Attending: Family Medicine | Admitting: Family Medicine

## 2018-12-28 DIAGNOSIS — M79604 Pain in right leg: Secondary | ICD-10-CM | POA: Insufficient documentation

## 2018-12-28 DIAGNOSIS — M25561 Pain in right knee: Secondary | ICD-10-CM | POA: Diagnosis present

## 2018-12-28 DIAGNOSIS — M25562 Pain in left knee: Secondary | ICD-10-CM | POA: Insufficient documentation

## 2019-01-04 ENCOUNTER — Ambulatory Visit: Payer: Medicare Other | Admitting: Family Medicine

## 2019-01-05 ENCOUNTER — Telehealth: Payer: Self-pay

## 2019-01-05 NOTE — Telephone Encounter (Signed)
Called and spoke with patient, advised that xrays shows osteoarthritis and no fractures or dislocations. Thanks !

## 2019-01-05 NOTE — Telephone Encounter (Signed)
I believe patient is asking about xrays. Please advise on results. Thanks!

## 2019-01-05 NOTE — Telephone Encounter (Signed)
Please let patient know that her xrays showed osteoarthritis. No fractures or dislocations.

## 2019-01-15 ENCOUNTER — Other Ambulatory Visit: Payer: Self-pay

## 2019-01-15 DIAGNOSIS — IMO0001 Reserved for inherently not codable concepts without codable children: Secondary | ICD-10-CM

## 2019-01-15 MED ORDER — LIRAGLUTIDE 18 MG/3ML ~~LOC~~ SOPN: PEN_INJECTOR | SUBCUTANEOUS | 12 refills | Status: DC

## 2019-01-16 ENCOUNTER — Other Ambulatory Visit: Payer: Self-pay | Admitting: Family Medicine

## 2019-01-16 DIAGNOSIS — R0982 Postnasal drip: Secondary | ICD-10-CM

## 2019-01-26 ENCOUNTER — Other Ambulatory Visit: Payer: Self-pay | Admitting: Family Medicine

## 2019-01-26 DIAGNOSIS — R0982 Postnasal drip: Secondary | ICD-10-CM

## 2019-01-31 ENCOUNTER — Encounter (HOSPITAL_COMMUNITY): Payer: Self-pay

## 2019-01-31 ENCOUNTER — Encounter (HOSPITAL_COMMUNITY): Payer: Self-pay | Admitting: *Deleted

## 2019-02-05 ENCOUNTER — Other Ambulatory Visit: Payer: Self-pay

## 2019-02-05 DIAGNOSIS — M79604 Pain in right leg: Secondary | ICD-10-CM

## 2019-02-05 MED ORDER — DICLOFENAC SODIUM 1 % TD GEL: 2.0000 g | Freq: Four times a day (QID) | TRANSDERMAL | 0 refills | Status: DC

## 2019-03-02 ENCOUNTER — Other Ambulatory Visit: Payer: Self-pay | Admitting: Family Medicine

## 2019-03-02 DIAGNOSIS — I6523 Occlusion and stenosis of bilateral carotid arteries: Secondary | ICD-10-CM

## 2019-03-29 ENCOUNTER — Ambulatory Visit: Payer: Medicare Other | Admitting: Family Medicine

## 2019-04-24 ENCOUNTER — Other Ambulatory Visit: Payer: Self-pay | Admitting: Family Medicine

## 2019-04-24 DIAGNOSIS — R0982 Postnasal drip: Secondary | ICD-10-CM

## 2019-05-08 ENCOUNTER — Other Ambulatory Visit: Payer: Self-pay

## 2019-05-08 ENCOUNTER — Encounter: Payer: Self-pay | Admitting: Family Medicine

## 2019-05-08 ENCOUNTER — Ambulatory Visit (INDEPENDENT_AMBULATORY_CARE_PROVIDER_SITE_OTHER): Payer: Medicare Other | Admitting: Family Medicine

## 2019-05-08 VITALS — BP 137/67 | HR 60 | Temp 98.4°F | Resp 16 | Ht 60.25 in | Wt 234.0 lb

## 2019-05-08 DIAGNOSIS — M25562 Pain in left knee: Secondary | ICD-10-CM | POA: Diagnosis not present

## 2019-05-08 DIAGNOSIS — I10 Essential (primary) hypertension: Secondary | ICD-10-CM | POA: Diagnosis not present

## 2019-05-08 DIAGNOSIS — M25511 Pain in right shoulder: Secondary | ICD-10-CM

## 2019-05-08 DIAGNOSIS — E785 Hyperlipidemia, unspecified: Secondary | ICD-10-CM | POA: Diagnosis not present

## 2019-05-08 DIAGNOSIS — E119 Type 2 diabetes mellitus without complications: Secondary | ICD-10-CM | POA: Diagnosis not present

## 2019-05-08 DIAGNOSIS — G8929 Other chronic pain: Secondary | ICD-10-CM

## 2019-05-08 LAB — POCT URINALYSIS DIPSTICK
Bilirubin, UA: NEGATIVE
Blood, UA: NEGATIVE
Glucose, UA: NEGATIVE
Ketones, UA: NEGATIVE
Leukocytes, UA: NEGATIVE
Nitrite, UA: NEGATIVE
Protein, UA: NEGATIVE
Spec Grav, UA: 1.01 (ref 1.010–1.025)
Urobilinogen, UA: 0.2 E.U./dL
pH, UA: 5 (ref 5.0–8.0)

## 2019-05-08 LAB — POCT GLYCOSYLATED HEMOGLOBIN (HGB A1C): Hemoglobin A1C: 6.6 % — AB (ref 4.0–5.6)

## 2019-05-08 NOTE — Patient Instructions (Addendum)
You have lost a total of 6 pounds.  No medication changes on today Sent a referral to orthopedics  Hemoglobin a1C is at goal, 6.6  Your A1C goal is less than 7. Your fasting blood sugar  Upon awakening goal is between 110-140.  Your LDL  (bad cholesterol goal is less than 100 Blood pressure goal is <140/90.  Recommend a lowfat, low carbohydrate diet divided over 5-6 small meals, increase water intake to 6-8 glasses, and 150 minutes per week of cardiovascular exercise.   Take your medications as prescribed Make sure that you are familiar with each one of your medications and what they are used to treat.  If you are unsure of medications, please bring to follow up Will send referral for eye exam  Please keep your scheduled follow up appointment.

## 2019-05-09 ENCOUNTER — Telehealth: Payer: Self-pay

## 2019-05-09 ENCOUNTER — Other Ambulatory Visit: Payer: Self-pay | Admitting: Family Medicine

## 2019-05-09 DIAGNOSIS — E119 Type 2 diabetes mellitus without complications: Secondary | ICD-10-CM

## 2019-05-09 LAB — BASIC METABOLIC PANEL
BUN/Creatinine Ratio: 20 (ref 12–28)
BUN: 13 mg/dL (ref 8–27)
CO2: 27 mmol/L (ref 20–29)
Calcium: 10 mg/dL (ref 8.7–10.3)
Chloride: 100 mmol/L (ref 96–106)
Creatinine, Ser: 0.64 mg/dL (ref 0.57–1.00)
GFR calc Af Amer: 105 mL/min/{1.73_m2} (ref >59)
GFR calc non Af Amer: 91 mL/min/{1.73_m2} (ref >59)
Glucose: 108 mg/dL — ABNORMAL HIGH (ref 65–99)
Potassium: 3.8 mmol/L (ref 3.5–5.2)
Sodium: 142 mmol/L (ref 134–144)

## 2019-05-09 NOTE — Telephone Encounter (Signed)
Called and spoke with patient, advised that creatinine level was within range, advised that all other labs were unremarkable. Encouraged her to continue with weight loss and carb modified diet. Reminded that ortho will be calling to schedule an appointment for bilateral knee pain. Her next appointments for labs and then tele visit in 1 week are scheduled. Thanks!

## 2019-05-09 NOTE — Telephone Encounter (Signed)
-----   Message from Dorena Dew, [place] sent at 05/09/2019  2:27 PM EST ----- Regarding: lab results Please inform patient that her creatinine level, which is an indicator of kidney functioning is within normal range.  Also, all other laboratory values are unremarkable.  During appointment, it was noticed that patient has decreased weight by 6 pounds.  Recommend that she continues a carbohydrate modified diet.  Also, a referral was sent to orthopedic services for chronic bilateral knee pain.  For management of mild to moderate knee pain, recommend Tylenol 1000 mg every 6 hours as needed.  Our next appointment should be scheduled as a telephone visit, patient will come and for labs 1 week prior to telephone visit. She will also need to schedule a nurse visit for vital signs on same day as lab appointment. Donia Pounds  APRN, MSN, FNP-C Patient [facility] [address] [place], [place] [address] [phone]

## 2019-05-09 NOTE — Progress Notes (Signed)
Orders Placed This Encounter  Procedures  . Hemoglobin A1c    Standing Status:   Future    Standing Expiration Date:   05/08/2020  . Microalbumin/Creatinine Ratio, Urine    Standing Status:   Future    Standing Expiration Date:   05/08/2020  . Basic Metabolic Panel (BMET)    Standing Status:   Future    Standing Expiration Date:   05/08/2020    Donia Pounds  APRN, MSN, FNP-C Patient [facility] [address] [place], [place] [address] [phone]

## 2019-05-14 ENCOUNTER — Telehealth: Payer: Self-pay | Admitting: Radiology

## 2019-05-14 ENCOUNTER — Ambulatory Visit (INDEPENDENT_AMBULATORY_CARE_PROVIDER_SITE_OTHER): Payer: Medicare Other | Admitting: Physician Assistant

## 2019-05-14 ENCOUNTER — Encounter: Payer: Self-pay | Admitting: Physician Assistant

## 2019-05-14 ENCOUNTER — Other Ambulatory Visit: Payer: Self-pay

## 2019-05-14 DIAGNOSIS — M1711 Unilateral primary osteoarthritis, right knee: Secondary | ICD-10-CM

## 2019-05-14 DIAGNOSIS — M1712 Unilateral primary osteoarthritis, left knee: Secondary | ICD-10-CM

## 2019-05-14 NOTE — Telephone Encounter (Signed)
Noted. Will submit in January 2021.

## 2019-05-14 NOTE — Telephone Encounter (Signed)
Bilateral knee supplemental injections.  Patients insurance will be changing to [facility] after 1st of year.

## 2019-05-14 NOTE — Progress Notes (Signed)
Office Visit Note   Patient: Erin House           Date of Birth: 1951/02/05           MRN: [id number] Visit Date: 05/14/2019              Requested by: Dorena Dew, FNP [address],  [place] [address] PCP: Lanae Boast, FNP   Assessment & Plan: Visit Diagnoses: No diagnosis found.  Plan: Discussed knee friendly exercises with her.  Also discussed quad strengthening and showed her exercises for this.  In regards to her knees recommend supplemental injections both knees we will try to get this approved and have her back once available.  Questions were encouraged and answered at length.  She will continue to use her Voltaren gel.  She can use her Mobic periodically no other NSAIDs while on Mobic.  Follow-Up Instructions: Return for Supplemental injection.   Orders:  No orders of the defined types were placed in this encounter.  No orders of the defined types were placed in this encounter.     Procedures: No procedures performed   Clinical Data: No additional findings.   Subjective: Chief Complaint  Patient presents with   Left Knee - Pain   Right Knee - Pain    HPI Mrs. Erin House is a 68 year old female comes in today with bilateral knee pain left greater than right.  Pain has been present for greater than a year first he was just off and on pain but has become worse after a fall August of this year.  She had radiographs of both knees and August 2020 personally reviewed these.  These show what appears to be moderate patellofemoral arthritic changes of both knees and right greater than left medial compartmental narrowing which is moderate.  Some mild tricompartmental changes lateral compartments bilaterally.  No acute fractures.  She reports a history of right knee supplemental injection in the past which fevers is given good relief.  She is been told she has a mid meniscal tear which did not require any kind of surgical intervention.   Currently she is having giving way of the left knee only.  No mechanical symptoms right knee.  She is diabetic last hemoglobin A1c was 6.6.  She takes Mobic periodically for her knee pain when she cannot sleep as it does make her sleepy.  She also uses Voltaren gel on both knees.  MRI of her right knee 2017 showed suspected small tears both in the medial lateral meniscus.  Cartilage showed moderate degenerative changes of the patellofemoral compartment moderate to significant chondral thinning medial compartment and moderate degenerative thinning lateral compartment.  Review of Systems Negative for fevers chills shortness of breath chest pain please see HPI otherwise negative  Objective: Vital Signs: There were no vitals taken for this visit.  Physical Exam Constitutional:      Appearance: She is not ill-appearing or diaphoretic.  Pulmonary:     Effort: Pulmonary effort is normal.  Neurological:     Mental Status: She is alert and oriented to person, place, and time.  Psychiatric:        Mood and Affect: Mood normal.     Ortho Exam Bilateral knees: Good range of motion both knees.  No abnormal warmth erythema or effusion.  Tenderness along the medial lateral joint line of both knees.  Slight hyper extension of both knees.  No instability valgus varus stressing of either knee.  McMurray's is  negative bilaterally.  Significant patellofemoral crepitus both knees. Specialty Comments:  No specialty comments available.  Imaging: No results found.   PMFS History: Patient Active Problem List   Diagnosis Date Noted   Hair loss 08/24/2017   Carotid stenosis, asymptomatic, bilateral 03/23/2017   PVD (peripheral vascular disease) with claudication ([place]) 10/18/2016   Claudication ([place]) 10/18/2016   Varicose veins of both legs with edema [id number]   Diastolic dysfunction without heart failure 10/18/2016   Heel pain, chronic, left 06/29/2016   History of mastectomy 02/06/2015    Need for Tdap vaccination 07/15/2014   Obstructive sleep apnea 07/15/2014   Need for immunization against influenza 02/21/2014   Obesity, morbid ([facility]) 02/21/2014   Diabetes mellitus without complication ([place] [facility]) [id number]   Screening for colon cancer 01/17/2014   Other screening mammogram 01/17/2014   Hyperlipidemia 01/17/2014   Bilateral lower extremity edema 01/17/2014   Essential hypertension 01/17/2014   Past Medical History:  Diagnosis Date   Arthritis    knees   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  Right saphenofemoral junction and right greater saphenous vein incompetence.    History reviewed. No pertinent family history.  Past Surgical History:  Procedure Laterality Date   BREAST BIOPSY Right 04/29/2014   BREAST EXCISIONAL BIOPSY Right    BREAST LUMPECTOMY WITH RADIOACTIVE SEED LOCALIZATION Right 06/11/2014   Procedure: BREAST LUMPECTOMY WITH RADIOACTIVE SEED LOCALIZATION;  Surgeon: Jackolyn Confer, MD;  Location: [facility];  Service: General;  Laterality: Right;   CHOLECYSTECTOMY     CYST ON WRIST     LEFT WRIST-BENIGN   MASTECTOMY Left 05/1999   ROTATOR CUFF REPAIR     LEFT SHOULDER   WISDOM TOOTH EXTRACTION     Social History   Occupational History   Not on file  Tobacco Use   Smoking status: Former Smoker    Quit date: 05/24/1993    Years since quitting: 25.9   Smokeless tobacco: Never Used  Substance and Sexual Activity   Alcohol use: Yes    Alcohol/week: 0.0 standard drinks    Comment: occasionlly   Drug use: No   Sexual activity: Not on file

## 2019-05-17 NOTE — Progress Notes (Signed)
Patient [facility]   Established Patient Office Visit  Subjective:  Patient ID: Erin House, female    DOB: May 02, 1951  Age: 68 y.o. MRN: [id number]  CC:  Chief Complaint  Patient presents with  . Hypertension  . Diabetes  . Shoulder Pain  . Medication Refill    most all meds    Karris Deangelo, a very pleasant 68 year old female with a medical history significant for type 2 diabetes mellitus, controlled, essential hypertension, hyperlipidemia, osteoarthritis, left breast cancer, and chronic knee pain presents for follow-up of chronic conditions.  Hypertension This is a chronic problem. The problem has been gradually improving since onset. The problem is controlled. Pertinent negatives include no orthopnea, palpitations or shortness of breath. There are no associated agents to hypertension. Risk factors for coronary artery disease include diabetes mellitus, dyslipidemia, obesity and sedentary lifestyle. Compliance problems include diet and exercise.   Diabetes She presents for her follow-up diabetic visit. She has type 2 diabetes mellitus. There are no hypoglycemic associated symptoms. Pertinent negatives for hypoglycemia include no nervousness/anxiousness or sleepiness. Pertinent negatives for diabetes include no polydipsia, no polyphagia and no polyuria. There are no hypoglycemic complications. She is compliant with treatment all of the time. She is following a generally healthy diet. She rarely participates in exercise. She sees a podiatrist.Eye exam is current.  Shoulder Pain  The pain is present in the right shoulder. This is a recurrent problem. The current episode started more than 1 month ago. There has been no history of extremity trauma. The problem occurs intermittently. The problem has been waxing and waning. The quality of the pain is described as aching. The pain is at a severity of 3/10. The symptoms are aggravated by activity and  cold. She has tried movement for the symptoms. The treatment provided mild relief. Family history does not include gout or rheumatoid arthritis. Her past medical history is significant for diabetes and osteoarthritis.     Past Medical History:  Diagnosis Date  . Arthritis    knees  . Cancer ([place]) 2000   left breast  . Diabetes mellitus without complication ([place])   . Hyperlipemia   . Hypertension   . Sleep apnea    has CPAP but does not wear every night  . Venous insufficiency of both lower extremities 12/2016   Bilateral femoral and small saphenous vein incompetence.  Right saphenofemoral junction and right greater saphenous vein incompetence.    Past Surgical History:  Procedure Laterality Date  . BREAST BIOPSY Right 04/29/2014  . BREAST EXCISIONAL BIOPSY Right   . BREAST LUMPECTOMY WITH RADIOACTIVE SEED LOCALIZATION Right 06/11/2014   Procedure: BREAST LUMPECTOMY WITH RADIOACTIVE SEED LOCALIZATION;  Surgeon: Jackolyn Confer, MD;  Location: [facility];  Service: General;  Laterality: Right;  . CHOLECYSTECTOMY    . CYST ON WRIST     LEFT WRIST-BENIGN  . MASTECTOMY Left 05/1999  . ROTATOR CUFF REPAIR     LEFT SHOULDER  . WISDOM TOOTH EXTRACTION      History reviewed. No pertinent family history.  Social History   Socioeconomic History  . Marital status: Married    Spouse name: Not on file  . Number of children: Not on file  . Years of education: Not on file  . Highest education level: Not on file  Occupational History  . Not on file  Tobacco Use  . Smoking status: Former Smoker    Quit date: 05/24/1993    Years since  quitting: 25.9  . Smokeless tobacco: Never Used  Substance and Sexual Activity  . Alcohol use: Yes    Alcohol/week: 0.0 standard drinks    Comment: occasionlly  . Drug use: No  . Sexual activity: Not on file  Other Topics Concern  . Not on file  Social History Narrative  . Not on file   Social Determinants of Health   Financial  Resource Strain:   . Difficulty of Paying Living Expenses: Not on file  Food Insecurity:   . Worried About Charity fundraiser in the Last Year: Not on file  . Ran Out of Food in the Last Year: Not on file  Transportation Needs:   . Lack of Transportation (Medical): Not on file  . Lack of Transportation (Non-Medical): Not on file  Physical Activity:   . Days of Exercise per Week: Not on file  . Minutes of Exercise per Session: Not on file  Stress:   . Feeling of Stress : Not on file  Social Connections:   . Frequency of Communication with Friends and Family: Not on file  . Frequency of Social Gatherings with Friends and Family: Not on file  . Attends Religious Services: Not on file  . Active Member of Clubs or Organizations: Not on file  . Attends Archivist Meetings: Not on file  . Marital Status: Not on file  Intimate Partner Violence:   . Fear of Current or Ex-Partner: Not on file  . Emotionally Abused: Not on file  . Physically Abused: Not on file  . Sexually Abused: Not on file    Outpatient Medications Prior to Visit  Medication Sig Dispense Refill  . acetaminophen (TYLENOL) 500 MG tablet Take 1 tablet (500 mg total) by mouth every 6 (six) hours as needed. 30 tablet 0  . Blood Glucose Monitoring Suppl (ACCU-CHEK AVIVA PLUS) w/Device KIT 1 each by Does not apply route daily. 1 kit 0  . cetirizine (ZYRTEC) 10 MG tablet Take 1 tablet by mouth once daily 30 tablet 0  . cyclobenzaprine (FLEXERIL) 5 MG tablet Take 1 tablet (5 mg total) by mouth at bedtime. 30 tablet 1  . diclofenac sodium (VOLTAREN) 1 % GEL Apply 2 g topically 4 (four) times daily. 350 g 0  . EQ ASPIRIN ADULT LOW DOSE 81 MG EC tablet Take 1 tablet by mouth once daily 90 tablet 0  . furosemide (LASIX) 40 MG tablet Take 1 tablet (40 mg total) by mouth daily. 90 tablet 1  . glucose blood (ACCU-CHEK AVIVA) test strip Will check fasting blood sugar daily. Use as instructed 100 each 12  . liraglutide  (VICTOZA) 18 MG/3ML SOPN 0.6 mg daily for 1 week. Week 2, increase to 1.2 mg daily 3 mL 12  . lisinopril (PRINIVIL,ZESTRIL) 20 MG tablet Take 1 tablet (20 mg total) by mouth daily. 90 tablet 3  . meloxicam (MOBIC) 15 MG tablet Take 1 tablet (15 mg total) by mouth daily. 30 tablet 1  . metFORMIN (GLUCOPHAGE) 1000 MG tablet TAKE 1 TABLET BY MOUTH TWICE DAILY WITH MEALS 180 tablet 0  . NONFORMULARY OR COMPOUNDED ITEM Baclofen 2%, cyclobenzaprine% diclofenac 3% and lidocaine2%    . omega-3 acid ethyl esters (LOVAZA) 1 g capsule Take 1 capsule (1 g total) by mouth 2 (two) times daily. 180 capsule 3  . potassium chloride (K-DUR) 10 MEQ tablet Take 1 tablet (10 mEq total) by mouth daily. 90 tablet 1  . rosuvastatin (CRESTOR) 20 MG tablet Take 1 tablet (  20 mg total) by mouth daily. 90 tablet 3   No facility-administered medications prior to visit.    No Known Allergies  ROS Review of Systems  Respiratory: Negative for shortness of breath.   Cardiovascular: Negative for palpitations and orthopnea.  Endocrine: Negative for polydipsia, polyphagia and polyuria.  Psychiatric/Behavioral: The patient is not nervous/anxious.       Objective:    Physical Exam  BP 137/67 (BP Location: Right Arm, Patient Position: Sitting, Cuff Size: Large)   Pulse 60   Temp 98.4 F (36.9 C) (Oral)   Resp 16   Ht 5' 0.25" (1.53 m)   Wt 234 lb (106.1 kg)   SpO2 100%   BMI 45.32 kg/m  Wt Readings from Last 3 Encounters:  05/08/19 234 lb (106.1 kg)  12/27/18 240 lb (108.9 kg)  07/05/18 239 lb (108.4 kg)     Health Maintenance Due  Topic Date Due  . OPHTHALMOLOGY EXAM  12/03/2018  . URINE MICROALBUMIN  12/27/2018    There are no preventive care reminders to display for this patient.  Lab Results  Component Value Date   TSH 0.588 10/13/2018   Lab Results  Component Value Date   WBC 5.5 06/29/2016   HGB 11.4 (L) 06/29/2016   HCT 37.6 06/29/2016   MCV 78.0 (L) 06/29/2016   PLT 170 06/29/2016    Lab Results  Component Value Date   NA 142 05/08/2019   K 3.8 05/08/2019   CO2 27 05/08/2019   GLUCOSE 108 (H) 05/08/2019   BUN 13 05/08/2019   CREATININE 0.64 05/08/2019   BILITOT 0.9 10/13/2018   ALKPHOS 81 10/13/2018   AST 19 10/13/2018   ALT 24 10/13/2018   PROT 6.1 10/13/2018   ALBUMIN 3.8 10/13/2018   CALCIUM 10.0 05/08/2019   ANIONGAP 9 04/24/2015   Lab Results  Component Value Date   CHOL 142 10/13/2018   Lab Results  Component Value Date   HDL 53 10/13/2018   Lab Results  Component Value Date   LDLCALC 62 10/13/2018   Lab Results  Component Value Date   TRIG 134 10/13/2018   Lab Results  Component Value Date   CHOLHDL 2.7 10/13/2018   Lab Results  Component Value Date   HGBA1C 6.6 (A) 05/08/2019      Assessment & Plan:   Problem List Items Addressed This Visit      Cardiovascular and Mediastinum   Essential hypertension   Relevant Orders   Basic Metabolic Panel (Completed)     Endocrine   Diabetes mellitus without complication ([place]) - Primary   Relevant Orders   HgB A1c (Completed)   Urinalysis Dipstick (Completed)   Basic Metabolic Panel (Completed)     Other   Hyperlipidemia    Other Visit Diagnoses    Chronic pain of left knee       Relevant Orders   AMB referral to orthopedics     1. Diabetes mellitus without complication ([place]) Ms. Dodds's hemoglobin A1c is 6.6, which is improved from previous.  Patient praised for all of her efforts to decrease hemoglobin A1c.  She is currently at goal.  Discussed diet and adding a little exercise at length.  Patient has been unable to exercise since the [facility] has close due to COVID-19 pandemic.  Patient has only been able to tolerate water exercises over the past several years. Hemoglobin A1c goals continue to be less than 7.  Blood pressure goal is less than 140/90.  We will recheck cholesterol  in 6 months. - HgB A1c - Urinalysis Dipstick - Basic Metabolic Panel  2. Chronic pain of left  knee Patient has a history of chronic bilateral knee pain, primarily on left side.  She states that knee pain is typically worse in the mornings. Patient advised to decrease weight to help with this problem.  Also, she warrants referral to orthopedic services.  Steroid injections may be needed at this juncture to improve quality of life. - AMB referral to orthopedics  3. Essential hypertension BP 137/67 (BP Location: Right Arm, Patient Position: Sitting, Cuff Size: Large)   Pulse 60   Temp 98.4 F (36.9 C) (Oral)   Resp 16   Ht 5' 0.25" (1.53 m)   Wt 234 lb (106.1 kg)   SpO2 100%   BMI 45.32 kg/m  Blood pressure is currently at goal.  Patient's BMI is 45, recommend carbohydrate modified diet and increasing daily activity.- Continue medication, monitor blood pressure at home. Continue DASH diet. Reminder to go to the ER if any CP, SOB, nausea, dizziness, severe HA, changes vision/speech, left arm numbness and tingling and jaw pain.  - Basic Metabolic Panel  4. Hyperlipidemia, unspecified hyperlipidemia type The 10-year ASCVD risk score Mikey Bussing DC Brooke Bonito., et al., 2013) is: 22.3%   Values used to calculate the score:     Age: 43 years     Sex: Female     Is Non-Hispanic African American: Yes     Diabetic: Yes     Tobacco smoker: No     Systolic Blood Pressure: 220 mmHg     Is BP treated: Yes     HDL Cholesterol: 53 mg/dL     Total Cholesterol: 142 mg/dL   5. Right shoulder pain, unspecified chronicity Patient was advised to refrain from nephrotoxic medications such as ibuprofen and naproxen due to history of diabetes and hypertension.  However, recommend Tylenol 500 mg every 4-6 hours for mild to moderate pain.  Patient has right shoulder and bilateral knee pain.  I suspect is related to osteoarthritis.  Will defer to orthopedic services for further work-up and evaluation - AMB referral to orthopedics   Follow-up: Return in about 3 months (around 08/06/2019) for diabetes.   Donia Pounds  APRN, MSN, FNP-C [facility] [address] [place], [place] [address] [phone]

## 2019-06-05 ENCOUNTER — Telehealth: Payer: Self-pay

## 2019-06-05 NOTE — Telephone Encounter (Signed)
Submitted VOB for Monovisc, bilateral knee. 

## 2019-06-07 ENCOUNTER — Other Ambulatory Visit: Payer: Self-pay | Admitting: Family Medicine

## 2019-06-07 ENCOUNTER — Telehealth: Payer: Self-pay

## 2019-06-07 DIAGNOSIS — I10 Essential (primary) hypertension: Secondary | ICD-10-CM

## 2019-06-07 NOTE — Telephone Encounter (Signed)
Called and left a VM advising patient to CB to schedule an appointment with Artis Delay or Dr. Ninfa Linden for gel injection.  Approved for Monovisc, bilateral knee. [place] Once OOP has been met, patient is covered at 100%. Co-pay of $40.00 PA required PA Approval# [id number] Valid 06/07/2019- 12/05/2019

## 2019-06-14 ENCOUNTER — Other Ambulatory Visit: Payer: Self-pay | Admitting: Family Medicine

## 2019-06-14 DIAGNOSIS — R0982 Postnasal drip: Secondary | ICD-10-CM

## 2019-06-14 DIAGNOSIS — I10 Essential (primary) hypertension: Secondary | ICD-10-CM

## 2019-07-02 ENCOUNTER — Ambulatory Visit: Payer: Medicare PPO

## 2019-07-10 ENCOUNTER — Other Ambulatory Visit: Payer: Self-pay | Admitting: Internal Medicine

## 2019-07-10 DIAGNOSIS — I6523 Occlusion and stenosis of bilateral carotid arteries: Secondary | ICD-10-CM

## 2019-07-31 ENCOUNTER — Other Ambulatory Visit: Payer: Medicare PPO

## 2019-07-31 ENCOUNTER — Other Ambulatory Visit: Payer: Self-pay

## 2019-07-31 DIAGNOSIS — E119 Type 2 diabetes mellitus without complications: Secondary | ICD-10-CM

## 2019-08-01 LAB — BASIC METABOLIC PANEL
BUN/Creatinine Ratio: 20 (ref 12–28)
BUN: 13 mg/dL (ref 8–27)
CO2: 25 mmol/L (ref 20–29)
Calcium: 9.5 mg/dL (ref 8.7–10.3)
Chloride: 104 mmol/L (ref 96–106)
Creatinine, Ser: 0.64 mg/dL (ref 0.57–1.00)
GFR calc Af Amer: 104 mL/min/{1.73_m2} (ref >59)
GFR calc non Af Amer: 90 mL/min/{1.73_m2} (ref >59)
Glucose: 112 mg/dL — ABNORMAL HIGH (ref 65–99)
Potassium: 4 mmol/L (ref 3.5–5.2)
Sodium: 142 mmol/L (ref 134–144)

## 2019-08-01 LAB — HEMOGLOBIN A1C
Est. average glucose Bld gHb Est-mCnc: 143 mg/dL
Hgb A1c MFr Bld: 6.6 % — ABNORMAL HIGH (ref 4.8–5.6)

## 2019-08-01 LAB — MICROALBUMIN / CREATININE URINE RATIO
Creatinine, Urine: 169.1 mg/dL
Microalb/Creat Ratio: 13 mg/g creat (ref 0–29)
Microalbumin, Urine: 22.1 ug/mL

## 2019-08-07 ENCOUNTER — Other Ambulatory Visit: Payer: Self-pay

## 2019-08-07 ENCOUNTER — Ambulatory Visit (INDEPENDENT_AMBULATORY_CARE_PROVIDER_SITE_OTHER): Payer: Medicare PPO | Admitting: Family Medicine

## 2019-08-07 VITALS — BP 144/63 | HR 86 | Temp 98.6°F | Resp 16 | Ht 60.25 in | Wt 232.0 lb

## 2019-08-07 DIAGNOSIS — E119 Type 2 diabetes mellitus without complications: Secondary | ICD-10-CM | POA: Diagnosis not present

## 2019-08-07 DIAGNOSIS — E559 Vitamin D deficiency, unspecified: Secondary | ICD-10-CM | POA: Diagnosis not present

## 2019-08-07 MED ORDER — TRULICITY 1.5 MG/0.5ML ~~LOC~~ SOAJ: 1.5000 mg | SUBCUTANEOUS | 12 refills | Status: DC

## 2019-08-07 NOTE — Progress Notes (Signed)
Established Patient Office Visit  Subjective:  Patient ID: Erin House, female    DOB: 1950/06/16  Age: 69 y.o. MRN: [id number]  CC:  Chief Complaint  Patient presents with  . Hypertension  . Diabetes  . Shoulder Pain    right shoulder and arm pain   . Neck Pain    both sides   . Medication Refill    zyrtec   . Follow-up    wants rx for vitamin D     HPI  Erin House, a pleasant 69 year old female with a medical history significant for type 2 diabetes mellitus, essential hypertension, hyperlipidemia, osteoarthritis, history of left breast cancer completed chemotherapy and radiation, and history of chronic knee pain presents for follow-up of chronic conditions.  Patient states that she has been feeling well and has minimal complaints.  She reports daily aches and pains primarily to neck, shoulders, and bilateral knees.   Hypertension This is a chronic problem. Pertinent negatives include no anxiety, blurred vision, chest pain, malaise/fatigue, palpitations, peripheral edema, shortness of breath or sweats. Risk factors for coronary artery disease include sedentary lifestyle and obesity. The current treatment provides significant improvement. There is no history of kidney disease, heart failure, left ventricular hypertrophy or retinopathy.  Diabetes She presents for her follow-up diabetic visit. She has type 2 diabetes mellitus. Her disease course has been improving. Pertinent negatives for hypoglycemia include no sweats. Pertinent negatives for diabetes include no blurred vision, no chest pain, no polydipsia, no polyphagia, no polyuria and no weakness. Symptoms are stable. Pertinent negatives for diabetic complications include no retinopathy. She is following a generally healthy diet. She has not had a previous visit with a dietitian. She participates in exercise three times a week. An ACE inhibitor/angiotensin II receptor blocker is being taken. She sees a podiatrist.Eye exam  is current.    Past Medical History:  Diagnosis Date  . Arthritis    knees  . Cancer ([place]) 2000   left breast  . Diabetes mellitus without complication ([place])   . Hyperlipemia   . Hypertension   . Sleep apnea    has CPAP but does not wear every night  . Venous insufficiency of both lower extremities 12/2016   Bilateral femoral and small saphenous vein incompetence.  Right saphenofemoral junction and right greater saphenous vein incompetence.    Past Surgical History:  Procedure Laterality Date  . BREAST BIOPSY Right 04/29/2014  . BREAST EXCISIONAL BIOPSY Right   . BREAST LUMPECTOMY WITH RADIOACTIVE SEED LOCALIZATION Right 06/11/2014   Procedure: BREAST LUMPECTOMY WITH RADIOACTIVE SEED LOCALIZATION;  Surgeon: Jackolyn Confer, MD;  Location: [facility];  Service: General;  Laterality: Right;  . CHOLECYSTECTOMY    . CYST ON WRIST     LEFT WRIST-BENIGN  . MASTECTOMY Left 05/1999  . ROTATOR CUFF REPAIR     LEFT SHOULDER  . WISDOM TOOTH EXTRACTION      No family history on file.  Social History   Socioeconomic History  . Marital status: Married    Spouse name: Not on file  . Number of children: Not on file  . Years of education: Not on file  . Highest education level: Not on file  Occupational History  . Not on file  Tobacco Use  . Smoking status: Former Smoker    Quit date: 05/24/1993    Years since quitting: 26.2  . Smokeless tobacco: Never Used  Substance and Sexual Activity  . Alcohol use: Yes    Alcohol/week:  0.0 standard drinks    Comment: occasionlly  . Drug use: No  . Sexual activity: Not on file  Other Topics Concern  . Not on file  Social History Narrative  . Not on file   Social Determinants of Health   Financial Resource Strain:   . Difficulty of Paying Living Expenses:   Food Insecurity:   . Worried About Charity fundraiser in the Last Year:   . Arboriculturist in the Last Year:   Transportation Needs:   . Film/video editor  (Medical):   Marland Kitchen Lack of Transportation (Non-Medical):   Physical Activity:   . Days of Exercise per Week:   . Minutes of Exercise per Session:   Stress:   . Feeling of Stress :   Social Connections:   . Frequency of Communication with Friends and Family:   . Frequency of Social Gatherings with Friends and Family:   . Attends Religious Services:   . Active Member of Clubs or Organizations:   . Attends Archivist Meetings:   Marland Kitchen Marital Status:   Intimate Partner Violence:   . Fear of Current or Ex-Partner:   . Emotionally Abused:   Marland Kitchen Physically Abused:   . Sexually Abused:     Outpatient Medications Prior to Visit  Medication Sig Dispense Refill  . acetaminophen (TYLENOL) 500 MG tablet Take 1 tablet (500 mg total) by mouth every 6 (six) hours as needed. 30 tablet 0  . Blood Glucose Monitoring Suppl (ACCU-CHEK AVIVA PLUS) w/Device KIT 1 each by Does not apply route daily. 1 kit 0  . cetirizine (ZYRTEC) 10 MG tablet Take 1 tablet by mouth once daily 30 tablet 0  . cyclobenzaprine (FLEXERIL) 5 MG tablet Take 1 tablet (5 mg total) by mouth at bedtime. 30 tablet 1  . diclofenac sodium (VOLTAREN) 1 % GEL Apply 2 g topically 4 (four) times daily. 350 g 0  . EQ ASPIRIN ADULT LOW DOSE 81 MG EC tablet Take 1 tablet by mouth once daily 90 tablet 0  . furosemide (LASIX) 40 MG tablet Take 1 tablet by mouth once daily 90 tablet 0  . glucose blood (ACCU-CHEK AVIVA) test strip Will check fasting blood sugar daily. Use as instructed 100 each 12  . lisinopril (ZESTRIL) 20 MG tablet Take 1 tablet by mouth once daily 90 tablet 0  . meloxicam (MOBIC) 15 MG tablet Take 1 tablet (15 mg total) by mouth daily. 30 tablet 1  . metFORMIN (GLUCOPHAGE) 1000 MG tablet TAKE 1 TABLET BY MOUTH TWICE DAILY WITH MEALS 180 tablet 0  . NONFORMULARY OR COMPOUNDED ITEM Baclofen 2%, cyclobenzaprine% diclofenac 3% and lidocaine2%    . omega-3 acid ethyl esters (LOVAZA) 1 g capsule Take 1 capsule (1 g total) by mouth  2 (two) times daily. 180 capsule 3  . potassium chloride (K-DUR) 10 MEQ tablet Take 1 tablet (10 mEq total) by mouth daily. 90 tablet 1  . rosuvastatin (CRESTOR) 20 MG tablet Take 1 tablet (20 mg total) by mouth daily. 90 tablet 3  . liraglutide (VICTOZA) 18 MG/3ML SOPN 0.6 mg daily for 1 week. Week 2, increase to 1.2 mg daily 3 mL 12   No facility-administered medications prior to visit.    No Known Allergies  ROS Review of Systems  Constitutional: Negative.  Negative for malaise/fatigue.  HENT: Negative.   Eyes: Negative for blurred vision.  Respiratory: Negative.  Negative for shortness of breath.   Cardiovascular: Negative.  Negative for chest  pain, palpitations and leg swelling.  Gastrointestinal: Negative.   Endocrine: Negative.  Negative for polydipsia, polyphagia and polyuria.  Genitourinary: Negative.   Musculoskeletal: Negative.   Skin: Negative.   Neurological: Negative.  Negative for weakness.  Hematological: Negative.   Psychiatric/Behavioral: Negative.       Objective:    Physical Exam  Constitutional: She is oriented to person, place, and time. She appears well-developed and well-nourished.  HENT:  Head: Normocephalic.  Eyes: Pupils are equal, round, and reactive to light.  Cardiovascular: Normal rate and regular rhythm.  Pulmonary/Chest: Effort normal.  Abdominal: Soft. Bowel sounds are normal.  Musculoskeletal:        General: Normal range of motion.  Neurological: She is alert and oriented to person, place, and time.  Skin: Skin is warm.    BP (!) 144/63 (BP Location: Right Arm, Patient Position: Sitting, Cuff Size: Large)   Pulse 86   Temp 98.6 F (37 C) (Oral)   Resp 16   Ht 5' 0.25" (1.53 m)   Wt 232 lb (105.2 kg)   SpO2 99%   BMI 44.93 kg/m  Wt Readings from Last 3 Encounters:  08/07/19 232 lb (105.2 kg)  05/08/19 234 lb (106.1 kg)  12/27/18 240 lb (108.9 kg)     Health Maintenance Due  Topic Date Due  . OPHTHALMOLOGY EXAM   12/03/2018  . FOOT EXAM  07/06/2019    There are no preventive care reminders to display for this patient.  Lab Results  Component Value Date   TSH 0.588 10/13/2018   Lab Results  Component Value Date   WBC 5.5 06/29/2016   HGB 11.4 (L) 06/29/2016   HCT 37.6 06/29/2016   MCV 78.0 (L) 06/29/2016   PLT 170 06/29/2016   Lab Results  Component Value Date   NA 142 07/31/2019   K 4.0 07/31/2019   CO2 25 07/31/2019   GLUCOSE 112 (H) 07/31/2019   BUN 13 07/31/2019   CREATININE 0.64 07/31/2019   BILITOT 0.9 10/13/2018   ALKPHOS 81 10/13/2018   AST 19 10/13/2018   ALT 24 10/13/2018   PROT 6.1 10/13/2018   ALBUMIN 3.8 10/13/2018   CALCIUM 9.5 07/31/2019   ANIONGAP 9 04/24/2015   Lab Results  Component Value Date   CHOL 142 10/13/2018   Lab Results  Component Value Date   HDL 53 10/13/2018   Lab Results  Component Value Date   LDLCALC 62 10/13/2018   Lab Results  Component Value Date   TRIG 134 10/13/2018   Lab Results  Component Value Date   CHOLHDL 2.7 10/13/2018   Lab Results  Component Value Date   HGBA1C 6.6 (H) 07/31/2019      Assessment & Plan:   Problem List Items Addressed This Visit      Endocrine   Diabetes mellitus without complication ([place]) - Primary   Relevant Medications   Dulaglutide (TRULICITY) 1.5 ES/9.2ZR SOPN      Meds ordered this encounter  Medications  . Dulaglutide (TRULICITY) 1.5 AQ/7.6AU SOPN    Sig: Inject 1.5 mg into the skin once a week.    Dispense:  1 pen    Refill:  12    Order Specific Question:   Supervising Provider    Answer:   Tresa Garter [[id number]]    Diabetes mellitus without complication ([place]) Hemoglobin A1c is 6.6, which is at goal.  We will add dulaglutide once weekly.  Discontinue Victoza, patient says that she is not adherent to Victoza  being that she has to have daily injections.  She feels as if she will be more compliant with once weekly.  Also, discussed the importance of diet and exercise  consistently in order to achieve positive outcomes. - Dulaglutide (TRULICITY) 1.5 PP/2.9JJ SOPN; Inject 1.5 mg into the skin once a week.  Dispense: 1 pen; Refill: 12   Vitamin D deficiency - Vitamin D, 25-hydroxy   Obesity, morbid ([place]) The patient is asked to make an attempt to improve diet and exercise patterns to aid in medical management of this problem.  Follow-up: Return in about 3 months (around 11/07/2019) for diabetes, hypertension.     Donia Pounds  APRN, MSN, FNP-C [facility] [address] [place], [place] [address] [phone]

## 2019-08-08 ENCOUNTER — Other Ambulatory Visit: Payer: Self-pay | Admitting: Family Medicine

## 2019-08-08 ENCOUNTER — Telehealth: Payer: Self-pay

## 2019-08-08 DIAGNOSIS — E559 Vitamin D deficiency, unspecified: Secondary | ICD-10-CM

## 2019-08-08 LAB — VITAMIN D 25 HYDROXY (VIT D DEFICIENCY, FRACTURES): Vit D, 25-Hydroxy: 29.3 ng/mL — ABNORMAL LOW (ref 30.0–100.0)

## 2019-08-08 MED ORDER — ERGOCALCIFEROL 1.25 MG (50000 UT) PO CAPS: 50000.0000 [IU] | ORAL_CAPSULE | ORAL | 3 refills | Status: DC

## 2019-08-08 NOTE — Telephone Encounter (Signed)
-----   Message from Erin House, Ray sent at 08/08/2019 10:00 AM EDT ----- Regarding: lab results Please inform Erin House that vitamin D level continues to be decreased. Will continue drisdol 50,000 IU per week. Will repeat vitamin D level in 6 months.  Inquire about patient's mammogram and bone density. Let me know if I need to place a new order. All other labs and medications were discussed with patient at length during appointment.   Erin Pounds  APRN, MSN, FNP-C [facility] [address] [place], [place] [address] [phone]

## 2019-08-08 NOTE — Progress Notes (Signed)
Meds ordered this encounter  Medications   ergocalciferol (VITAMIN D2) 1.25 MG (50000 UT) capsule    Sig: Take 1 capsule (50,000 Units total) by mouth once a week.    Dispense:  12 capsule    Refill:  3    Order Specific Question:   Supervising Provider    Answer:   JEGEDE, OLUGBEMIGA E [[id number]]   Erin Botz Moore [name]  APRN, MSN, FNP-C [facility] [address]  [place], [place] [address] [phone]  

## 2019-08-08 NOTE — Telephone Encounter (Signed)
Called and spoke with patient regarding labs for vitamin D. Advised that this level was decreased and to take once weekly vitamin d 50,000 units. Advised that we will recheck this at 6 months. Patient verbalized understanding. Thanks!

## 2019-09-11 ENCOUNTER — Encounter: Payer: Self-pay | Admitting: Family Medicine

## 2019-09-11 ENCOUNTER — Ambulatory Visit (INDEPENDENT_AMBULATORY_CARE_PROVIDER_SITE_OTHER): Payer: Medicare PPO | Admitting: Family Medicine

## 2019-09-11 ENCOUNTER — Other Ambulatory Visit: Payer: Self-pay

## 2019-09-11 DIAGNOSIS — M79641 Pain in right hand: Secondary | ICD-10-CM | POA: Diagnosis not present

## 2019-09-11 DIAGNOSIS — G629 Polyneuropathy, unspecified: Secondary | ICD-10-CM

## 2019-09-11 DIAGNOSIS — M25512 Pain in left shoulder: Secondary | ICD-10-CM | POA: Diagnosis not present

## 2019-09-11 DIAGNOSIS — M25511 Pain in right shoulder: Secondary | ICD-10-CM

## 2019-09-11 DIAGNOSIS — Z87891 Personal history of nicotine dependence: Secondary | ICD-10-CM

## 2019-09-11 DIAGNOSIS — M79642 Pain in left hand: Secondary | ICD-10-CM

## 2019-09-11 MED ORDER — GABAPENTIN 100 MG PO CAPS: 100.0000 mg | ORAL_CAPSULE | Freq: Every day | ORAL | 0 refills | Status: DC

## 2019-09-11 NOTE — Progress Notes (Signed)
[facility]   Virtual Visit via Telephone Note  I connected with Erin House on 09/11/19 at  1:40 PM EDT by telephone and verified that I am speaking with the correct person using two identifiers.   I discussed the limitations, risks, security and privacy concerns of performing an evaluation and management service by telephone and the availability of in person appointments. I also discussed with the patient that there may be a patient responsible charge related to this service. The patient expressed understanding and agreed to proceed.   History of Present Illness:  Erin House is a 69 year old female with a medical history significant for type 2 diabetes mellitus, essential hypertension, obesity, history of left breast cancer, hyperlipidemia, OSA, and osteo arthritis presents via phone with complaints of upper extremity pain.  Bilateral upper extremity pain  Patient complaints of bilateral shoulder and hand pain. Erin House denies any personal injury. The pain is characterized as This is evaluated as a  shooting.  The onset of the pain was sudden, not related to any specific activity.  The pain occurs continuously and is worsened with activity. Patient also endorses occasional shoulder stiffness and weakness. Pain intensity is 6/10. Pain is unrelieved by meloxicam.    Past Medical History:  Diagnosis Date  . Arthritis    knees  . Cancer ([place]) 2000   left breast  . Diabetes mellitus without complication ([place])   . Hyperlipemia   . Hypertension   . Sleep apnea    has CPAP but does not wear every night  . Venous insufficiency of both lower extremities 12/2016   Bilateral femoral and small saphenous vein incompetence.  Right saphenofemoral junction and right greater saphenous vein incompetence.   Social History   Socioeconomic History  . Marital status: Married    Spouse name: Not on file  . Number of children: Not on file    . Years of education: Not on file  . Highest education level: Not on file  Occupational History  . Not on file  Tobacco Use  . Smoking status: Former Smoker    Quit date: 05/24/1993    Years since quitting: 26.3  . Smokeless tobacco: Never Used  Substance and Sexual Activity  . Alcohol use: Yes    Alcohol/week: 0.0 standard drinks    Comment: occasionlly  . Drug use: No  . Sexual activity: Not Currently  Other Topics Concern  . Not on file  Social History Narrative  . Not on file   Social Determinants of Health   Financial Resource Strain:   . Difficulty of Paying Living Expenses:   Food Insecurity:   . Worried About Charity fundraiser in the Last Year:   . Arboriculturist in the Last Year:   Transportation Needs:   . Film/video editor (Medical):   Marland Kitchen Lack of Transportation (Non-Medical):   Physical Activity:   . Days of Exercise per Week:   . Minutes of Exercise per Session:   Stress:   . Feeling of Stress :   Social Connections:   . Frequency of Communication with Friends and Family:   . Frequency of Social Gatherings with Friends and Family:   . Attends Religious Services:   . Active Member of Clubs or Organizations:   . Attends Archivist Meetings:   Marland Kitchen Marital Status:   Intimate Partner Violence:   . Fear of Current or Ex-Partner:   .  Emotionally Abused:   Marland Kitchen Physically Abused:   . Sexually Abused:   No Known Allergies  Immunization History  Administered Date(s) Administered  . Influenza,inj,Quad PF,6+ Mos 02/18/2014, 01/30/2015, 02/20/2016  . Influenza-Unspecified 03/04/2018  . PFIZER SARS-COV-2 Vaccination 06/18/2019, 07/09/2019  . Pneumococcal Conjugate-13 08/15/2015  . Pneumococcal Polysaccharide-23 04/11/2014  . Tdap 07/15/2014  . Zoster 04/11/2014  . Zoster Recombinat (Shingrix) 05/16/2019   Review of Systems  Constitutional: Negative.   HENT: Negative.   Eyes: Negative.   Respiratory: Negative.   Cardiovascular: Negative.    Genitourinary: Negative.   Musculoskeletal: Positive for joint pain (Bilateral shoulder pain).  Skin: Negative.   Neurological: Negative.   Endo/Heme/Allergies: Negative.   Psychiatric/Behavioral: Negative.     Assessment and Plan: Neuropathy - gabapentin (NEURONTIN) 100 MG capsule; Take 1 capsule (100 mg total) by mouth at bedtime.  Dispense: 30 capsule; Refill: 0  Acute pain of both shoulders Will start a trial of gabapentin for greater pain control. Patient also scheduled with orthopedic specialist for this problem.  - gabapentin (NEURONTIN) 100 MG capsule; Take 1 capsule (100 mg total) by mouth at bedtime.  Dispense: 30 capsule; Refill: 0  Pain in both hands - gabapentin (NEURONTIN) 100 MG capsule; Take 1 capsule (100 mg total) by mouth at bedtime.  Dispense: 30 capsule; Refill: 0  Follow Up Instructions:    I discussed the assessment and treatment plan with the patient. The patient was provided an opportunity to ask questions and all were answered. The patient agreed with the plan and demonstrated an understanding of the instructions.   The patient was advised to call back or seek an in-person evaluation if the symptoms worsen or if the condition fails to improve as anticipated.  I provided 10 minutes of non-face-to-face time during this encounter.   Donia Pounds  APRN, MSN, FNP-C [facility] [address] [place], [place] [id number] [phone]

## 2019-09-17 ENCOUNTER — Ambulatory Visit: Payer: Self-pay

## 2019-09-17 ENCOUNTER — Encounter: Payer: Self-pay | Admitting: Physician Assistant

## 2019-09-17 ENCOUNTER — Ambulatory Visit (INDEPENDENT_AMBULATORY_CARE_PROVIDER_SITE_OTHER): Payer: Medicare PPO | Admitting: Physician Assistant

## 2019-09-17 ENCOUNTER — Ambulatory Visit (INDEPENDENT_AMBULATORY_CARE_PROVIDER_SITE_OTHER): Payer: Medicare PPO

## 2019-09-17 ENCOUNTER — Other Ambulatory Visit: Payer: Self-pay

## 2019-09-17 VITALS — Ht 60.25 in | Wt 232.0 lb

## 2019-09-17 DIAGNOSIS — M79641 Pain in right hand: Secondary | ICD-10-CM

## 2019-09-17 DIAGNOSIS — M79642 Pain in left hand: Secondary | ICD-10-CM

## 2019-09-17 DIAGNOSIS — M654 Radial styloid tenosynovitis [de Quervain]: Secondary | ICD-10-CM

## 2019-09-17 MED ORDER — METHYLPREDNISOLONE ACETATE 40 MG/ML IJ SUSP
40.0000 mg | INTRAMUSCULAR | Status: AC | PRN
  Administered 2019-09-17: 17:00:00 40 mg

## 2019-09-17 MED ORDER — LIDOCAINE HCL 1 % IJ SOLN
1.0000 mL | INTRAMUSCULAR | Status: AC | PRN
  Administered 2019-09-17: 1 mL

## 2019-09-17 NOTE — Progress Notes (Signed)
Office Visit Note   Patient: Erin House           Date of Birth: Dec 26, 1950           MRN: [id number] Visit Date: 09/17/2019              Requested by: Erin Dew, FNP [address],  [place] [id number] PCP: Erin Dew, FNP   Assessment & Plan: Visit Diagnoses:  1. Bilateral hand pain   2. De Quervain's tenosynovitis, left     Plan: Due to patient's numbness tingling right hand that is getting worse and more constant recommend EMG nerve conduction times rule out carpal tunnel syndrome.  In regards to the left hand we will place her in a Velcro wrist splint with a thumb spica to help with the de Quervain's.  She will wear this during the day.  We will see her back after the EMG nerve conduction studies and reevaluate her right hand and left wrist pain.  Follow-Up Instructions: Return After EMG nerve conduction studies right upper extremity rule out carpal tunnel syndrome..   Orders:  Orders Placed This Encounter  Procedures  . Hand/UE Inj  . XR Hand Complete Right  . XR Hand Complete Left  . Ambulatory referral to Physical Medicine Rehab   No orders of the defined types were placed in this encounter.     Procedures: Hand/UE Inj: L extensor compartment 1 for de Quervain's tenosynovitis on 09/17/2019 5:26 PM Medications: 1 mL lidocaine 1 %; 40 mg methylPREDNISolone acetate 40 MG/ML Consent was given by the parent. Patient was prepped and draped in the usual sterile fashion.       Clinical Data: No additional findings.   Subjective: Chief Complaint  Patient presents with  . Right Hand - Pain  . Left Hand - Pain    HPI Erin House is a 69 year old female who is known to Dr. Ninfa Linden service.  She comes in today with bilateral hand pain.  States pains been ongoing for the past 3 weeks no known injury to either hand.  She states her right hand goes numb particularly at night.  She notes that the numbness in the hand is  becoming more more constant.  She has tried NSAIDs and Neurontin without any real relief.  She is diabetic is been diabetic for some 20 years.  Ports her last hemoglobin A1c to be 6.6.  She is also having left hand pain and weakness.  Is worse with turning knobs with holding objects.  She is having no significant numbness in the left hand. Covid vaccine greater than 2 weeks ago. Review of Systems Negative for fevers chills shortness of breath chest pain  Objective: Vital Signs: Ht 5' 0.25" (1.53 m)   Wt 232 lb (105.2 kg)   BMI 44.93 kg/m   Physical Exam Constitutional:      Appearance: She is not ill-appearing or diaphoretic.  Pulmonary:     Effort: Pulmonary effort is normal.  Neurological:     Mental Status: She is alert and oriented to person, place, and time.  Psychiatric:        Mood and Affect: Mood normal.        Behavior: Behavior normal.     Ortho Exam Right hand full sensation light touch.  Negative Tinel's over the median nerve negative compression test.  Negative Phalen's.  Left hand negative Phalen's over the median nerve at the wrist and  negative compression test in the median nerve at wrist.  Full motor bilateral hands.  Positive Finkelstein's on the left negative on the right.  Left wrist tenderness over the distal ulna.  No rashes skin lesions ulcerations bilateral hands.  No noticeable edema of either hand.  Grip strengths are normal bilaterally.  Good range of motion bilateral elbows without pain.  Nontender of the medial lateral epicondyles of both elbows.  Full supination pronation bilateral forearms. Specialty Comments:  No specialty comments available.  Imaging: XR Hand Complete Left  Result Date: 09/17/2019 Left hand 3 views: No acute findings.  No acute fractures no subluxations dislocations.  Cystic change within the distal ulna.  XR Hand Complete Right  Result Date: 09/17/2019 Right hand 3 views: No acute fracture or acute findings.  No significant  arthritic changes.    PMFS History: Patient Active Problem List   Diagnosis Date Noted  . Hair loss 08/24/2017  . Carotid stenosis, asymptomatic, bilateral 03/23/2017  . PVD (peripheral vascular disease) with claudication ([place]) 10/18/2016  . Claudication ([place]) 10/18/2016  . Varicose veins of both legs with edema 10/18/2016  . Diastolic dysfunction without heart failure 10/18/2016  . Heel pain, chronic, left 06/29/2016  . History of mastectomy 02/06/2015  . Need for Tdap vaccination 07/15/2014  . Obstructive sleep apnea 07/15/2014  . Need for immunization against influenza 02/21/2014  . Obesity, morbid ([place]) 02/21/2014  . Diabetes mellitus without complication ([place]) [id number]  . Screening for colon cancer 01/17/2014  . Other screening mammogram 01/17/2014  . Hyperlipidemia 01/17/2014  . Bilateral lower extremity edema 01/17/2014  . Essential hypertension 01/17/2014   Past Medical History:  Diagnosis Date  . Arthritis    knees  . Cancer ([place]) 2000   left breast  . Diabetes mellitus without complication ([place])   . Hyperlipemia   . Hypertension   . Sleep apnea    has CPAP but does not wear every night  . Venous insufficiency of both lower extremities 12/2016   Bilateral femoral and small saphenous vein incompetence.  Right saphenofemoral junction and right greater saphenous vein incompetence.    History reviewed. No pertinent family history.  Past Surgical History:  Procedure Laterality Date  . BREAST BIOPSY Right 04/29/2014  . BREAST EXCISIONAL BIOPSY Right   . BREAST LUMPECTOMY WITH RADIOACTIVE SEED LOCALIZATION Right 06/11/2014   Procedure: BREAST LUMPECTOMY WITH RADIOACTIVE SEED LOCALIZATION;  Surgeon: Jackolyn Confer, MD;  Location: [facility];  Service: General;  Laterality: Right;  . CHOLECYSTECTOMY    . CYST ON WRIST     LEFT WRIST-BENIGN  . MASTECTOMY Left 05/1999  . ROTATOR CUFF REPAIR     LEFT SHOULDER  . WISDOM TOOTH EXTRACTION     Social  History   Occupational History  . Not on file  Tobacco Use  . Smoking status: Former Smoker    Quit date: 05/24/1993    Years since quitting: 26.3  . Smokeless tobacco: Never Used  Substance and Sexual Activity  . Alcohol use: Yes    Alcohol/week: 0.0 standard drinks    Comment: occasionlly  . Drug use: No  . Sexual activity: Not Currently

## 2019-09-18 ENCOUNTER — Other Ambulatory Visit: Payer: Self-pay

## 2019-09-27 ENCOUNTER — Other Ambulatory Visit: Payer: Self-pay | Admitting: Family Medicine

## 2019-09-27 DIAGNOSIS — Z1231 Encounter for screening mammogram for malignant neoplasm of breast: Secondary | ICD-10-CM

## 2019-10-12 ENCOUNTER — Ambulatory Visit (INDEPENDENT_AMBULATORY_CARE_PROVIDER_SITE_OTHER): Payer: Medicare PPO | Admitting: Physical Medicine and Rehabilitation

## 2019-10-12 ENCOUNTER — Other Ambulatory Visit: Payer: Self-pay

## 2019-10-12 ENCOUNTER — Encounter: Payer: Self-pay | Admitting: Physical Medicine and Rehabilitation

## 2019-10-12 DIAGNOSIS — R202 Paresthesia of skin: Secondary | ICD-10-CM | POA: Diagnosis not present

## 2019-10-12 NOTE — Progress Notes (Signed)
Numbness in fingers worse in second and third fingers of right hand. Pain in hand and distal forearm on the right. Numbness and tingling worse at night. Pain in left hand. Right hand dominant. Numeric Pain Rating Scale and Functional Assessment Average Pain 10   In the last MONTH (on 0-10 scale) has pain interfered with the following?  1. General activity like being  able to carry out your everyday physical activities such as walking, climbing stairs, carrying groceries, or moving a chair?  Rating(7)

## 2019-10-15 ENCOUNTER — Other Ambulatory Visit: Payer: Self-pay | Admitting: Family Medicine

## 2019-10-15 DIAGNOSIS — E785 Hyperlipidemia, unspecified: Secondary | ICD-10-CM

## 2019-10-15 DIAGNOSIS — I6523 Occlusion and stenosis of bilateral carotid arteries: Secondary | ICD-10-CM

## 2019-10-15 NOTE — Procedures (Signed)
EMG & NCV Findings: Evaluation of the right median motor nerve showed prolonged distal onset latency (5.5 ms), reduced amplitude (2.5 mV), and decreased conduction velocity (Elbow-Wrist, 43 m/s).  The right ulnar motor and the right ulnar sensory nerves showed reduced amplitude (R1.7, R14.7 V).  The right median (across palm) sensory nerve showed prolonged distal peak latency (Wrist, 6.1 ms), reduced amplitude (3.9 V), and prolonged distal peak latency (Palm, 3.7 ms).  All remaining nerves (as indicated in the following tables) were within normal limits.    Needle evaluation of the right abductor pollicis brevis muscle showed increased insertional activity, increased motor unit amplitude, and diminished recruitment.  All remaining muscles (as indicated in the following table) showed no evidence of electrical instability.    Impression: The above electrodiagnostic study is ABNORMAL and reveals evidence of a severe right median nerve entrapment at the wrist (carpal tunnel syndrome) affecting sensory and motor components.   There is no significant electrodiagnostic evidence of any other focal nerve entrapment, brachial plexopathy or generalized peripheral neuropathy.   Recommendations: 1.  Follow-up with referring physician. 2.  Continue current management of symptoms. 3.  Continue use of resting splint at night-time and as needed during the day. 4.  Suggest surgical evaluation.  ___________________________ Laurence Spates FAAPMR Board Certified, American Board of Physical Medicine and Rehabilitation    Nerve Conduction Studies Anti Sensory Summary Table   Stim Site NR Peak (ms) Norm Peak (ms) P-T Amp (V) Norm P-T Amp Site1 Site2 Delta-P (ms) Dist (cm) Vel (m/s) Norm Vel (m/s)  Right Median Acr Palm Anti Sensory (2nd Digit)  30.4C  Wrist    *6.1 <3.6 *3.9 >10 Wrist Palm 2.4 0.0    Palm    *3.7 <2.0 1.8         Right Radial Anti Sensory (Base 1st Digit)  30.1C  Wrist    2.3 <3.1 11.6   Wrist Base 1st Digit 2.3 0.0    Right Ulnar Anti Sensory (5th Digit)  30.7C  Wrist    3.3 <3.7 *14.7 >15.0 Wrist 5th Digit 3.3 14.0 42 >38   Motor Summary Table   Stim Site NR Onset (ms) Norm Onset (ms) O-P Amp (mV) Norm O-P Amp Site1 Site2 Delta-0 (ms) Dist (cm) Vel (m/s) Norm Vel (m/s)  Right Median Motor (Abd Poll Brev)  30.4C  Wrist    *5.5 <4.2 *2.5 >5 Elbow Wrist 4.7 20.0 *43 >50  Elbow    10.2  2.8         Right Ulnar Motor (Abd Dig Min)  30.7C  Wrist    3.0 <4.2 *1.7 >3 B Elbow Wrist 2.9 19.5 67 >53  B Elbow    5.9  6.4  A Elbow B Elbow 1.0 10.0 100 >53  A Elbow    6.9  7.6          EMG   Side Muscle Nerve Root Ins Act Fibs Psw Amp Dur Poly Recrt Int Fraser Din Comment  Right Abd Poll Brev Median C8-T1 *Incr Nml Nml *Incr Nml 0 *Reduced Nml   Right 1stDorInt Ulnar C8-T1 Nml Nml Nml Nml Nml 0 Nml Nml   Right PronatorTeres Median C6-7 Nml Nml Nml Nml Nml 0 Nml Nml     Nerve Conduction Studies Anti Sensory Left/Right Comparison   Stim Site L Lat (ms) R Lat (ms) L-R Lat (ms) L Amp (V) R Amp (V) L-R Amp (%) Site1 Site2 L Vel (m/s) R Vel (m/s) L-R Vel (m/s)  Median Acr [employer]  Anti Sensory (2nd Digit)  30.4C  Wrist  *6.1   *3.9  Wrist Palm     Palm  *3.7   1.8        Radial Anti Sensory (Base 1st Digit)  30.1C  Wrist  2.3   11.6  Wrist Base 1st Digit     Ulnar Anti Sensory (5th Digit)  30.7C  Wrist  3.3   *14.7  Wrist 5th Digit  42    Motor Left/Right Comparison   Stim Site L Lat (ms) R Lat (ms) L-R Lat (ms) L Amp (mV) R Amp (mV) L-R Amp (%) Site1 Site2 L Vel (m/s) R Vel (m/s) L-R Vel (m/s)  Median Motor (Abd Poll Brev)  30.4C  Wrist  *5.5   *2.5  Elbow Wrist  *43   Elbow  10.2   2.8        Ulnar Motor (Abd Dig Min)  30.7C  Wrist  3.0   *1.7  B Elbow Wrist  67   B Elbow  5.9   6.4  A Elbow B Elbow  100   A Elbow  6.9   7.6           Waveforms:

## 2019-10-15 NOTE — Progress Notes (Signed)
Erin House - 69 y.o. female MRN [id number]  Date of birth: 1951/03/16  Office Visit Note: Visit Date: 10/12/2019 PCP: Dorena Dew, FNP Referred by: Dorena Dew, FNP  Subjective: Chief Complaint  Patient presents with  . Right Hand - Numbness, Pain  . Left Hand - Pain   HPI: Erin House is a 69 y.o. female who comes in today At the request of Benita Stabile, P.A.-C for electrodiagnostic study of both upper limbs.  Patient is right-hand dominant with predominant pain and paresthesia in the right hand in particular the second and third fingers.  She gets some pain up into the distal forearm on the right.  She rates her pain as a 10 out of 10.  She has some pain on the left but no real paresthesia.  She has not had prior electrodiagnostic study.  No frank radicular symptoms.  History is complicated by type 2 diabetes with last hemoglobin A1c of 6.6.  No history of known polyneuropathy.  Charyl Bigger notes suggest the left hand pain may be de Quervain's tenosynovitis.  ROS Otherwise per HPI.  Assessment & Plan: Visit Diagnoses:  1. Paresthesia of skin     Plan: Impression: The above electrodiagnostic study is ABNORMAL and reveals evidence of a severe right median nerve entrapment at the wrist (carpal tunnel syndrome) affecting sensory and motor components.   There is no significant electrodiagnostic evidence of any other focal nerve entrapment, brachial plexopathy or generalized peripheral neuropathy.   Recommendations: 1.  Follow-up with referring physician. 2.  Continue current management of symptoms. 3.  Continue use of resting splint at night-time and as needed during the day. 4.  Suggest surgical evaluation.  Meds & Orders: No orders of the defined types were placed in this encounter.   Orders Placed This Encounter  Procedures  . NCV with EMG (electromyography)    Follow-up: Return for Benita Stabile, PA-C.   Procedures: No procedures performed  EMG & NCV  Findings: Evaluation of the right median motor nerve showed prolonged distal onset latency (5.5 ms), reduced amplitude (2.5 mV), and decreased conduction velocity (Elbow-Wrist, 43 m/s).  The right ulnar motor and the right ulnar sensory nerves showed reduced amplitude (R1.7, R14.7 V).  The right median (across palm) sensory nerve showed prolonged distal peak latency (Wrist, 6.1 ms), reduced amplitude (3.9 V), and prolonged distal peak latency (Palm, 3.7 ms).  All remaining nerves (as indicated in the following tables) were within normal limits.    Needle evaluation of the right abductor pollicis brevis muscle showed increased insertional activity, increased motor unit amplitude, and diminished recruitment.  All remaining muscles (as indicated in the following table) showed no evidence of electrical instability.    Impression: The above electrodiagnostic study is ABNORMAL and reveals evidence of a severe right median nerve entrapment at the wrist (carpal tunnel syndrome) affecting sensory and motor components.   There is no significant electrodiagnostic evidence of any other focal nerve entrapment, brachial plexopathy or generalized peripheral neuropathy.   Recommendations: 1.  Follow-up with referring physician. 2.  Continue current management of symptoms. 3.  Continue use of resting splint at night-time and as needed during the day. 4.  Suggest surgical evaluation.  ___________________________ Laurence Spates FAAPMR Board Certified, American Board of Physical Medicine and Rehabilitation    Nerve Conduction Studies Anti Sensory Summary Table   Stim Site NR Peak (ms) Norm Peak (ms) P-T Amp (V) Norm P-T Amp Site1 Site2 Delta-P (ms) Dist (cm) Vel (m/s)  Norm Vel (m/s)  Right Median Acr Palm Anti Sensory (2nd Digit)  30.4C  Wrist    *6.1 <3.6 *3.9 >10 Wrist Palm 2.4 0.0    Palm    *3.7 <2.0 1.8         Right Radial Anti Sensory (Base 1st Digit)  30.1C  Wrist    2.3 <3.1 11.6  Wrist Base 1st  Digit 2.3 0.0    Right Ulnar Anti Sensory (5th Digit)  30.7C  Wrist    3.3 <3.7 *14.7 >15.0 Wrist 5th Digit 3.3 14.0 42 >38   Motor Summary Table   Stim Site NR Onset (ms) Norm Onset (ms) O-P Amp (mV) Norm O-P Amp Site1 Site2 Delta-0 (ms) Dist (cm) Vel (m/s) Norm Vel (m/s)  Right Median Motor (Abd Poll Brev)  30.4C  Wrist    *5.5 <4.2 *2.5 >5 Elbow Wrist 4.7 20.0 *43 >50  Elbow    10.2  2.8         Right Ulnar Motor (Abd Dig Min)  30.7C  Wrist    3.0 <4.2 *1.7 >3 B Elbow Wrist 2.9 19.5 67 >53  B Elbow    5.9  6.4  A Elbow B Elbow 1.0 10.0 100 >53  A Elbow    6.9  7.6          EMG   Side Muscle Nerve Root Ins Act Fibs Psw Amp Dur Poly Recrt Int Fraser Din Comment  Right Abd Poll Brev Median C8-T1 *Incr Nml Nml *Incr Nml 0 *Reduced Nml   Right 1stDorInt Ulnar C8-T1 Nml Nml Nml Nml Nml 0 Nml Nml   Right PronatorTeres Median C6-7 Nml Nml Nml Nml Nml 0 Nml Nml     Nerve Conduction Studies Anti Sensory Left/Right Comparison   Stim Site L Lat (ms) R Lat (ms) L-R Lat (ms) L Amp (V) R Amp (V) L-R Amp (%) Site1 Site2 L Vel (m/s) R Vel (m/s) L-R Vel (m/s)  Median Acr Palm Anti Sensory (2nd Digit)  30.4C  Wrist  *6.1   *3.9  Wrist Palm     Palm  *3.7   1.8        Radial Anti Sensory (Base 1st Digit)  30.1C  Wrist  2.3   11.6  Wrist Base 1st Digit     Ulnar Anti Sensory (5th Digit)  30.7C  Wrist  3.3   *14.7  Wrist 5th Digit  42    Motor Left/Right Comparison   Stim Site L Lat (ms) R Lat (ms) L-R Lat (ms) L Amp (mV) R Amp (mV) L-R Amp (%) Site1 Site2 L Vel (m/s) R Vel (m/s) L-R Vel (m/s)  Median Motor (Abd Poll Brev)  30.4C  Wrist  *5.5   *2.5  Elbow Wrist  *43   Elbow  10.2   2.8        Ulnar Motor (Abd Dig Min)  30.7C  Wrist  3.0   *1.7  B Elbow Wrist  67   B Elbow  5.9   6.4  A Elbow B Elbow  100   A Elbow  6.9   7.6           Waveforms:             Clinical History: No specialty comments available.   She reports that she quit smoking about 26 years ago. She has  never used smokeless tobacco.  Recent Labs    05/08/19 1404 07/31/19 0943  HGBA1C 6.6* 6.6*    Objective:  VS:  HT:    WT:   BMI:     BP:   HR: bpm  TEMP: ( )  RESP:  Physical Exam Musculoskeletal:        General: No swelling, tenderness or deformity.     Comments: Inspection reveals flattening of the right APB no atrophy of the FDI or hand intrinsics. There is no swelling, color changes, allodynia or dystrophic changes. There is 5 out of 5 strength in the bilateral wrist extension, finger abduction and long finger flexion.  There is decreased sensation light touch in median nerve distribution on the right.  There is a negative Hoffmann's test bilaterally.  Skin:    General: Skin is warm and dry.     Findings: No erythema or rash.  Neurological:     General: No focal deficit present.     Mental Status: She is alert and oriented to person, place, and time.     Motor: No weakness or abnormal muscle tone.     Coordination: Coordination normal.  Psychiatric:        Mood and Affect: Mood normal.        Behavior: Behavior normal.     Ortho Exam  Imaging: No results found.  Past Medical/Family/Surgical/Social History: Medications & Allergies reviewed per EMR, new medications updated. Patient Active Problem List   Diagnosis Date Noted  . Hair loss 08/24/2017  . Carotid stenosis, asymptomatic, bilateral 03/23/2017  . PVD (peripheral vascular disease) with claudication ([facility]) 10/18/2016  . Claudication ([facility]) 10/18/2016  . Varicose veins of both legs with edema 10/18/2016  . Diastolic dysfunction without heart failure 10/18/2016  . Heel pain, chronic, left 06/29/2016  . History of mastectomy 02/06/2015  . Need for Tdap vaccination 07/15/2014  . Obstructive sleep apnea 07/15/2014  . Need for immunization against influenza 02/21/2014  . Obesity, morbid ([facility]) 02/21/2014  . Diabetes mellitus without complication ([facility]) [id number]  . Screening for colon cancer 01/17/2014  . Other  screening mammogram 01/17/2014  . Hyperlipidemia 01/17/2014  . Bilateral lower extremity edema 01/17/2014  . Essential hypertension 01/17/2014   Past Medical History:  Diagnosis Date  . Arthritis    knees  . Cancer ([facility]) 2000   left breast  . Diabetes mellitus without complication ([facility])   . Hyperlipemia   . Hypertension   . Sleep apnea    has CPAP but does not wear every night  . Venous insufficiency of both lower extremities 12/2016   Bilateral femoral and small saphenous vein incompetence.  Right saphenofemoral junction and right greater saphenous vein incompetence.   History reviewed. No pertinent family history. Past Surgical History:  Procedure Laterality Date  . BREAST BIOPSY Right 04/29/2014  . BREAST EXCISIONAL BIOPSY Right   . BREAST LUMPECTOMY WITH RADIOACTIVE SEED LOCALIZATION Right 06/11/2014   Procedure: BREAST LUMPECTOMY WITH RADIOACTIVE SEED LOCALIZATION;  Surgeon: Jackolyn Confer, MD;  Location: [facility];  Service: General;  Laterality: Right;  . CHOLECYSTECTOMY    . CYST ON WRIST     LEFT WRIST-BENIGN  . MASTECTOMY Left 05/1999  . ROTATOR CUFF REPAIR     LEFT SHOULDER  . WISDOM TOOTH EXTRACTION     Social History   Occupational History  . Not on file  Tobacco Use  . Smoking status: Former Smoker    Quit date: 05/24/1993    Years since quitting: 26.4  . Smokeless tobacco: Never Used  Substance and Sexual Activity  . Alcohol use: Yes    Alcohol/week: 0.0 standard  drinks    Comment: occasionlly  . Drug use: No  . Sexual activity: Not Currently

## 2019-10-16 ENCOUNTER — Other Ambulatory Visit: Payer: Medicare PPO

## 2019-10-17 ENCOUNTER — Encounter: Payer: Self-pay | Admitting: Physician Assistant

## 2019-10-17 ENCOUNTER — Other Ambulatory Visit: Payer: Self-pay

## 2019-10-17 ENCOUNTER — Ambulatory Visit: Payer: Medicare PPO | Admitting: Physician Assistant

## 2019-10-17 DIAGNOSIS — M654 Radial styloid tenosynovitis [de Quervain]: Secondary | ICD-10-CM | POA: Diagnosis not present

## 2019-10-17 DIAGNOSIS — G5601 Carpal tunnel syndrome, right upper limb: Secondary | ICD-10-CM | POA: Diagnosis not present

## 2019-10-17 MED ORDER — TRAMADOL HCL 50 MG PO TABS: 50.0000 mg | ORAL_TABLET | Freq: Four times a day (QID) | ORAL | 0 refills | Status: AC | PRN

## 2019-10-17 NOTE — Progress Notes (Signed)
Office Visit Note   Patient: Erin House           Date of Birth: 01-30-1951           MRN: [id number] Visit Date: 10/17/2019              Requested by: Dorena Dew, FNP [address],  [place] [address] PCP: Dorena Dew, FNP   Assessment & Plan: Visit Diagnoses:  1. Carpal tunnel syndrome, right upper limb   2. De Quervain's tenosynovitis, left     Plan: She will continue the splint with the thumb spica on the left wrist and Voltaren gel over the this area.  In regards to the right hand due to the fact that she has severe carpal tunnel syndrome recommend carpal tunnel release.  Surgery was discussed with the patient at length.  Risk including but not limited to prolonged pain, infection and wound healing problems discussed with patient.  She would like to proceed with surgery near future.  Questions encouraged and answered by Dr. Ninfa Linden and myself.  See her back 2 weeks postop.  Follow-Up Instructions: Return 2 weeks post op.   Orders:  No orders of the defined types were placed in this encounter.  Meds ordered this encounter  Medications  . traMADol (ULTRAM) 50 MG tablet    Sig: Take 1 tablet (50 mg total) by mouth every 6 (six) hours as needed.    Dispense:  30 tablet    Refill:  0      Procedures: No procedures performed   Clinical Data: No additional findings.   Subjective: Chief Complaint  Patient presents with  . Right Hand - Pain  . Left Hand - Pain    HPI Erin House returns today to go over the nerve conduction studies of her right upper extremity.  These were performed and read by Dr. Ernestina Patches.  Nerve conduction EMG study showed severe right carpal tunnel syndrome.  She states that since the studies that her right hand numbness tingling is becoming worse.  She is asking for something for pain due to the pain in the right hand.  In regards left hand she does feel that the injection helped some.  She states the brace of  the thumb spica splint is helpful.  Still has pain over this area though. Review of Systems See HPI.  Objective: Vital Signs: There were no vitals taken for this visit.  Physical Exam Constitutional:      Appearance: She is not ill-appearing or diaphoretic.  Neurological:     Mental Status: She is alert.     Ortho Exam Left hand [name] only mildly positive.  Slight tenderness over the first extensor compartment.  There is no rashes skin lesions ecchymosis over this area.  Grind test of the left thumb is negative. Specialty Comments:  No specialty comments available.  Imaging: No results found.   PMFS History: Patient Active Problem List   Diagnosis Date Noted  . Hair loss 08/24/2017  . Carotid stenosis, asymptomatic, bilateral 03/23/2017  . PVD (peripheral vascular disease) with claudication ([facility]) 10/18/2016  . Claudication ([facility]) 10/18/2016  . Varicose veins of both legs with edema 10/18/2016  . Diastolic dysfunction without heart failure 10/18/2016  . Heel pain, chronic, left 06/29/2016  . History of mastectomy 02/06/2015  . Need for Tdap vaccination 07/15/2014  . Obstructive sleep apnea 07/15/2014  . Need for immunization against influenza 02/21/2014  . Obesity, morbid ([facility]) 02/21/2014  .  Diabetes mellitus without complication ([facility]) [id number]  . Screening for colon cancer 01/17/2014  . Other screening mammogram 01/17/2014  . Hyperlipidemia 01/17/2014  . Bilateral lower extremity edema 01/17/2014  . Essential hypertension 01/17/2014   Past Medical History:  Diagnosis Date  . Arthritis    knees  . Cancer ([facility]) 2000   left breast  . Diabetes mellitus without complication ([facility])   . Hyperlipemia   . Hypertension   . Sleep apnea    has CPAP but does not wear every night  . Venous insufficiency of both lower extremities 12/2016   Bilateral femoral and small saphenous vein incompetence.  Right saphenofemoral junction and right greater saphenous vein  incompetence.    History reviewed. No pertinent family history.  Past Surgical History:  Procedure Laterality Date  . BREAST BIOPSY Right 04/29/2014  . BREAST EXCISIONAL BIOPSY Right   . BREAST LUMPECTOMY WITH RADIOACTIVE SEED LOCALIZATION Right 06/11/2014   Procedure: BREAST LUMPECTOMY WITH RADIOACTIVE SEED LOCALIZATION;  Surgeon: Jackolyn Confer, MD;  Location: [facility];  Service: General;  Laterality: Right;  . CHOLECYSTECTOMY    . CYST ON WRIST     LEFT WRIST-BENIGN  . MASTECTOMY Left 05/1999  . ROTATOR CUFF REPAIR     LEFT SHOULDER  . WISDOM TOOTH EXTRACTION     Social History   Occupational History  . Not on file  Tobacco Use  . Smoking status: Former Smoker    Quit date: 05/24/1993    Years since quitting: 26.4  . Smokeless tobacco: Never Used  Substance and Sexual Activity  . Alcohol use: Yes    Alcohol/week: 0.0 standard drinks    Comment: occasionlly  . Drug use: No  . Sexual activity: Not Currently

## 2019-10-18 ENCOUNTER — Telehealth: Payer: Self-pay | Admitting: Physician Assistant

## 2019-10-18 NOTE — Telephone Encounter (Signed)
Patient aware.

## 2019-10-18 NOTE — Telephone Encounter (Signed)
Patient called advised the medication for pain has not been called into her pharmacy which [facility] [address]. The number to contact patient is [phone]

## 2019-10-18 NOTE — Telephone Encounter (Signed)
Patient aware Rx is there

## 2019-10-23 ENCOUNTER — Telehealth: Payer: Self-pay | Admitting: Orthopaedic Surgery

## 2019-10-23 NOTE — Telephone Encounter (Signed)
Received vm from patient wanting records to be sent to [facility]. IC,lmvm advised need to sign records release form before we can fax records.

## 2019-10-26 ENCOUNTER — Other Ambulatory Visit: Payer: Self-pay

## 2019-10-26 ENCOUNTER — Ambulatory Visit: Payer: Medicare PPO

## 2019-10-26 VITALS — Ht 60.0 in

## 2019-10-26 DIAGNOSIS — I10 Essential (primary) hypertension: Secondary | ICD-10-CM

## 2019-10-26 DIAGNOSIS — E119 Type 2 diabetes mellitus without complications: Secondary | ICD-10-CM

## 2019-10-26 LAB — POCT GLYCOSYLATED HEMOGLOBIN (HGB A1C): Hemoglobin A1C: 6.4 % — AB (ref 4.0–5.6)

## 2019-10-26 MED ORDER — LISINOPRIL 20 MG PO TABS: 20.0000 mg | ORAL_TABLET | Freq: Every day | ORAL | 0 refills | Status: DC

## 2019-10-29 NOTE — Progress Notes (Signed)
Patient here for a nurse visit A1C only. AIC 6.4.

## 2019-10-31 ENCOUNTER — Other Ambulatory Visit: Payer: Self-pay | Admitting: Family Medicine

## 2019-10-31 DIAGNOSIS — M79604 Pain in right leg: Secondary | ICD-10-CM

## 2019-11-01 ENCOUNTER — Other Ambulatory Visit: Payer: Self-pay | Admitting: Orthopaedic Surgery

## 2019-11-01 DIAGNOSIS — G5601 Carpal tunnel syndrome, right upper limb: Secondary | ICD-10-CM | POA: Diagnosis not present

## 2019-11-01 MED ORDER — HYDROCODONE-ACETAMINOPHEN 5-325 MG PO TABS: 1.0000 | ORAL_TABLET | Freq: Four times a day (QID) | ORAL | 0 refills | Status: DC | PRN

## 2019-11-06 ENCOUNTER — Other Ambulatory Visit: Payer: Self-pay

## 2019-11-06 ENCOUNTER — Ambulatory Visit: Payer: Medicare PPO | Admitting: Family Medicine

## 2019-11-07 ENCOUNTER — Other Ambulatory Visit: Payer: Self-pay | Admitting: Family Medicine

## 2019-11-07 ENCOUNTER — Ambulatory Visit
Admission: RE | Admit: 2019-11-07 | Discharge: 2019-11-07 | Disposition: A | Discharge status: Home / Self Care | Payer: Medicare PPO | Source: Ambulatory Visit | Attending: Family Medicine | Admitting: Family Medicine

## 2019-11-07 DIAGNOSIS — Z1231 Encounter for screening mammogram for malignant neoplasm of breast: Secondary | ICD-10-CM

## 2019-11-15 ENCOUNTER — Other Ambulatory Visit: Payer: Self-pay

## 2019-11-15 ENCOUNTER — Encounter: Payer: Self-pay | Admitting: Physician Assistant

## 2019-11-15 ENCOUNTER — Ambulatory Visit (INDEPENDENT_AMBULATORY_CARE_PROVIDER_SITE_OTHER): Payer: Medicare PPO | Admitting: Physician Assistant

## 2019-11-15 ENCOUNTER — Inpatient Hospital Stay: Payer: Medicare PPO | Admitting: Physician Assistant

## 2019-11-15 DIAGNOSIS — Z9889 Other specified postprocedural states: Secondary | ICD-10-CM

## 2019-11-15 MED ORDER — HYDROCODONE-ACETAMINOPHEN 5-325 MG PO TABS: 1.0000 | ORAL_TABLET | Freq: Four times a day (QID) | ORAL | 0 refills | Status: DC | PRN

## 2019-11-15 NOTE — Progress Notes (Signed)
HPI: Erin House returns today 2 weeks status post right carpal tunnel release.  She is overall doing well.  She states that the long finger is actually worse than it was before surgery the ring finger is better and the index and thumb remain unchanged.  She has had no drainage no signs of infection about the incision site.  She is asking for refill on her hydrocodone.  Physical exam: Right hand surgical incisions healing well no signs of infection.  Sutures well approximate the wound.  Impression: Status post right carpal tunnel release 11/01/2019  Plan: Sutures removed Steri-Strips applied.  She is able to get the incision wet but no submerging the hand until wound is completely healed.  Like to see her back 2 weeks see what type.  Healing response she has had.  She will follow-up sooner if there is any questions concerns.  Refill on her hydrocodone was given.

## 2019-12-03 ENCOUNTER — Other Ambulatory Visit: Payer: Self-pay

## 2019-12-03 ENCOUNTER — Ambulatory Visit (INDEPENDENT_AMBULATORY_CARE_PROVIDER_SITE_OTHER): Payer: Medicare PPO | Admitting: Physician Assistant

## 2019-12-03 ENCOUNTER — Encounter: Payer: Self-pay | Admitting: Physician Assistant

## 2019-12-03 DIAGNOSIS — Z9889 Other specified postprocedural states: Secondary | ICD-10-CM

## 2019-12-03 MED ORDER — HYDROCODONE-ACETAMINOPHEN 5-325 MG PO TABS: 1.0000 | ORAL_TABLET | Freq: Four times a day (QID) | ORAL | 0 refills | Status: DC | PRN

## 2019-12-03 NOTE — Progress Notes (Signed)
HPI: Ms. Steuck returns today 4 weeks status post right carpal tunnel release.  She is overall doing well still has some numbness in the right hand median nerve distribution.  However she does state that the numbness throughout the right hand is improving.  72 problems with her left arm and hand which she has periodic swelling since undergoing a left mastectomy.  Physical exam: Right hand surgical incisions healing well no signs of infection.  Diminished sensation subjectively to light touch throughout the right hand.  She has full motor of the right hand.  Impression: Status post right carpal tunnel release 11/01/2019  Plan: Scar tissue mobilization right hand.  Follow-up with [place] in 4 weeks to check her progress.  Recommend a squeeze ball for the left hand also.  Elevation.  She thought it ready wearing compression sleeve.  Questions were encouraged and answered.

## 2019-12-31 ENCOUNTER — Other Ambulatory Visit: Payer: Self-pay

## 2019-12-31 ENCOUNTER — Encounter: Payer: Self-pay | Admitting: Orthopaedic Surgery

## 2019-12-31 ENCOUNTER — Ambulatory Visit (INDEPENDENT_AMBULATORY_CARE_PROVIDER_SITE_OTHER): Payer: Medicare PPO | Admitting: Orthopaedic Surgery

## 2019-12-31 VITALS — Ht 60.0 in | Wt 232.0 lb

## 2019-12-31 DIAGNOSIS — Z9889 Other specified postprocedural states: Secondary | ICD-10-CM

## 2019-12-31 NOTE — Progress Notes (Signed)
The patient is a pleasant 69 year old female who is now about 8 weeks status post a right open carpal tunnel release.  She still dealing with some hand pain and weakness as well as numbness and tingling.  Her carpal tunnel syndrome was quite severe.  On exam her incisions in the palm of the right hand is healed nicely.  She still has some residual swelling but she is able to move her fingers and thumb.  There is weak grip and pinch strength.  At this point I do feel like she would benefit from hand therapy from a certified hand therapist.  She already goes to [facility] for her hip.  I gave her a prescription for seeing their hand therapist for any modalities that can help improve the function of her right hand status post carpal tunnel release.  I will see her back myself in about 6 weeks to see how she is doing overall.  All questions and concerns were answered and addressed.

## 2020-01-07 ENCOUNTER — Other Ambulatory Visit: Payer: Self-pay | Admitting: Family Medicine

## 2020-01-07 DIAGNOSIS — I10 Essential (primary) hypertension: Secondary | ICD-10-CM

## 2020-01-07 DIAGNOSIS — I6523 Occlusion and stenosis of bilateral carotid arteries: Secondary | ICD-10-CM

## 2020-01-07 DIAGNOSIS — E785 Hyperlipidemia, unspecified: Secondary | ICD-10-CM

## 2020-01-22 ENCOUNTER — Telehealth: Payer: Medicare PPO | Admitting: Family Medicine

## 2020-02-11 ENCOUNTER — Ambulatory Visit: Payer: Medicare PPO | Admitting: Orthopaedic Surgery

## 2020-03-06 ENCOUNTER — Other Ambulatory Visit: Payer: Self-pay | Admitting: Family Medicine

## 2020-03-06 ENCOUNTER — Telehealth: Payer: Self-pay | Admitting: Family Medicine

## 2020-03-06 DIAGNOSIS — I6523 Occlusion and stenosis of bilateral carotid arteries: Secondary | ICD-10-CM

## 2020-03-06 DIAGNOSIS — E785 Hyperlipidemia, unspecified: Secondary | ICD-10-CM

## 2020-03-06 MED ORDER — ROSUVASTATIN CALCIUM 20 MG PO TABS: 20.0000 mg | ORAL_TABLET | Freq: Every day | ORAL | 5 refills | Status: DC

## 2020-03-06 NOTE — Telephone Encounter (Signed)
Done

## 2020-03-06 NOTE — Progress Notes (Signed)
Meds ordered this encounter  Medications  . rosuvastatin (CRESTOR) 20 MG tablet    Sig: Take 1 tablet (20 mg total) by mouth daily.    Dispense:  30 tablet    Refill:  5    Patient needs to contact office for an appointment.    Order Specific Question:   Supervising Provider    Answer:   Tresa Garter [[id number]]

## 2020-03-13 ENCOUNTER — Encounter: Payer: Self-pay | Admitting: Orthopaedic Surgery

## 2020-03-13 ENCOUNTER — Ambulatory Visit (INDEPENDENT_AMBULATORY_CARE_PROVIDER_SITE_OTHER): Payer: Medicare PPO | Admitting: Orthopaedic Surgery

## 2020-03-13 DIAGNOSIS — M65312 Trigger thumb, left thumb: Secondary | ICD-10-CM

## 2020-03-13 DIAGNOSIS — Z9889 Other specified postprocedural states: Secondary | ICD-10-CM

## 2020-03-13 NOTE — Progress Notes (Signed)
The patient is well-known to me.  We performed a right open carpal tunnel release about 6 months ago.  She was going to have hand therapy to strengthen her hand but now has had to go back and forth to [place] to take care of her mother.  She says her hand is weak but doing well overall.  She is 69 years old.  She is a diabetic but reports good control.  She is also been having some problems with pain and triggering with her left thumb.  Examination of her right hand shows a well-healed surgical incision from carpal tunnel surgery.  She does lack full extension of the PIP joint of her middle finger.  There is a weak grip strength but otherwise no muscle atrophy in the hand and incisions healed nicely.  Examination of her left thumb does show severe pain over the A1 pulley with active triggering of the left thumb.  I talked her about treatment options for trigger thumb.  She defers any further treatment today since she will be moving back to [place] this weekend for an extended period of time.  I did offer a steroid injection.  She does have a thumb spica Velcro splint and I recommended Voltaren gel.  All questions and concerns were answered addressed.  Follow-up can be as needed.

## 2020-03-14 ENCOUNTER — Other Ambulatory Visit: Payer: Self-pay

## 2020-03-14 ENCOUNTER — Other Ambulatory Visit (INDEPENDENT_AMBULATORY_CARE_PROVIDER_SITE_OTHER): Payer: Medicare PPO

## 2020-03-14 ENCOUNTER — Other Ambulatory Visit: Payer: Self-pay | Admitting: Family Medicine

## 2020-03-14 ENCOUNTER — Other Ambulatory Visit (HOSPITAL_COMMUNITY): Payer: Self-pay | Admitting: Family Medicine

## 2020-03-14 ENCOUNTER — Telehealth: Payer: Self-pay

## 2020-03-14 VITALS — Ht 60.0 in | Wt 217.6 lb

## 2020-03-14 DIAGNOSIS — I10 Essential (primary) hypertension: Secondary | ICD-10-CM

## 2020-03-14 DIAGNOSIS — E119 Type 2 diabetes mellitus without complications: Secondary | ICD-10-CM

## 2020-03-14 LAB — POCT URINALYSIS DIPSTICK
Bilirubin, UA: NEGATIVE
Blood, UA: NEGATIVE
Glucose, UA: NEGATIVE
Ketones, UA: NEGATIVE
Leukocytes, UA: NEGATIVE
Nitrite, UA: NEGATIVE
Protein, UA: POSITIVE — AB
Spec Grav, UA: 1.025 (ref 1.010–1.025)
Urobilinogen, UA: 4 E.U./dL — AB
pH, UA: 7 (ref 5.0–8.0)

## 2020-03-14 LAB — POCT GLYCOSYLATED HEMOGLOBIN (HGB A1C): Hemoglobin A1C: 6.3 % — AB (ref 4.0–5.6)

## 2020-03-14 NOTE — Progress Notes (Signed)
Orders Placed This Encounter  Procedures   Comprehensive Metabolic Panel (CMET)     Erin Pounds  APRN, MSN, FNP-C Patient Erin House [address] [place], [place] [address] [phone]

## 2020-03-14 NOTE — Telephone Encounter (Signed)
Unable to release CMP lab that was ordered

## 2020-03-14 NOTE — Addendum Note (Signed)
Addended by: Otilio Miu on: 03/14/2020 03:29 PM   Modules accepted: Orders

## 2020-03-15 LAB — COMPREHENSIVE METABOLIC PANEL
ALT: 37 IU/L — ABNORMAL HIGH (ref 0–32)
AST: 25 IU/L (ref 0–40)
Albumin/Globulin Ratio: 1.7 (ref 1.2–2.2)
Albumin: 4.4 g/dL (ref 3.7–4.7)
Alkaline Phosphatase: 85 IU/L (ref 44–121)
BUN/Creatinine Ratio: 37 — ABNORMAL HIGH (ref 12–28)
BUN: 22 mg/dL (ref 8–27)
Bilirubin Total: 0.6 mg/dL (ref 0.0–1.2)
CO2: 28 mmol/L (ref 20–29)
Calcium: 9.5 mg/dL (ref 8.7–10.3)
Chloride: 106 mmol/L (ref 96–106)
Creatinine, Ser: 0.6 mg/dL (ref 0.57–1.00)
GFR calc Af Amer: 106 mL/min/{1.73_m2} (ref >59)
GFR calc non Af Amer: 92 mL/min/{1.73_m2} (ref >59)
Globulin, Total: 2.6 g/dL (ref 1.5–4.5)
Glucose: 126 mg/dL — ABNORMAL HIGH (ref 65–99)
Potassium: 5 mmol/L (ref 3.5–5.2)
Sodium: 144 mmol/L (ref 134–144)
Total Protein: 7 g/dL (ref 6.0–8.5)

## 2020-03-18 ENCOUNTER — Telehealth: Payer: Medicare PPO | Admitting: Family Medicine

## 2020-04-01 ENCOUNTER — Telehealth (INDEPENDENT_AMBULATORY_CARE_PROVIDER_SITE_OTHER): Payer: Medicare PPO | Admitting: Family Medicine

## 2020-04-01 DIAGNOSIS — I10 Essential (primary) hypertension: Secondary | ICD-10-CM

## 2020-04-01 DIAGNOSIS — E119 Type 2 diabetes mellitus without complications: Secondary | ICD-10-CM

## 2020-04-01 DIAGNOSIS — E785 Hyperlipidemia, unspecified: Secondary | ICD-10-CM | POA: Diagnosis not present

## 2020-04-01 NOTE — Progress Notes (Signed)
Virtual Visit via Telephone Note  I connected with Erin House on 04/01/20 at  1:20 PM EST by telephone and verified that I am speaking with the correct person using two identifiers.  Location: Patient: Erin House, Erin House Provider: [facility]   I discussed the limitations, risks, security and privacy concerns of performing an evaluation and management service by telephone and the availability of in person appointments. I also discussed with the patient that there may be a patient responsible charge related to this service. The patient expressed understanding and agreed to proceed.   History of Present Illness: Erin House is a very pleasant 69 year old female with a medical history significant for type 2 diabetes mellitus without complication, morbid obesity, essential hypertension, history of peripheral vascular disease with claudication, history of breast carcinoma with mastectomy, and history of bilateral lower extremity edema presents via phone for follow-up of chronic conditions.  Patient says that she has been doing fairly well and is without complaint.  She is currently in [place] to care for her elderly mother.  She states that she has been taking all prescribed medications consistently.  She is mostly been following a low-fat, low carbohydrate diet.  Patient says that she has not been exercising due to weather constraints and is not going to a gym due to COVID-19.  Erin House was seen in clinic on 03/14/2020 for hemoglobin A1c, which was 6.3.  Patient's hemoglobin A1c has been at goal over the past several months since starting Trulicity.  Patient has not been checking blood glucose at home.  She denies any polyuria, polydipsia, or polyphagia.  She also does not check blood pressure at home.  Her blood pressure has been very well controlled.  She endorses some lower extremity swelling, which is chronic.  She denies any headache, blurred vision, dizziness, chest  pain, heart palpitations, or orthopnea.  Patient is up-to-date with colonoscopy, mammogram, and bone density.    Past Medical History:  Diagnosis Date  . Arthritis    knees  . Cancer ([place]) 2000   left breast  . Diabetes mellitus without complication ([facility])   . Hyperlipemia   . Hypertension   . Sleep apnea    has CPAP but does not wear every night  . Venous insufficiency of both lower extremities 12/2016   Bilateral femoral and small saphenous vein incompetence.  Right saphenofemoral junction and right greater saphenous vein incompetence.    Past Surgical History:  Procedure Laterality Date  . BREAST BIOPSY Right 04/29/2014  . BREAST EXCISIONAL BIOPSY Right   . BREAST LUMPECTOMY WITH RADIOACTIVE SEED LOCALIZATION Right 06/11/2014   Procedure: BREAST LUMPECTOMY WITH RADIOACTIVE SEED LOCALIZATION;  Surgeon: Jackolyn Confer, MD;  Location: [facility];  Service: General;  Laterality: Right;  . CHOLECYSTECTOMY    . CYST ON WRIST     LEFT WRIST-BENIGN  . MASTECTOMY Left 05/1999  . ROTATOR CUFF REPAIR     LEFT SHOULDER  . WISDOM TOOTH EXTRACTION      No family history on file.  Social History   Socioeconomic History  . Marital status: Married    Spouse name: Not on file  . Number of children: Not on file  . Years of education: Not on file  . Highest education level: Not on file  Occupational History  . Not on file  Tobacco Use  . Smoking status: Former Smoker    Quit date: 05/24/1993    Years since quitting: 26.9  . Smokeless tobacco:  Never Used  Vaping Use  . Vaping Use: Never used  Substance and Sexual Activity  . Alcohol use: Yes    Alcohol/week: 0.0 standard drinks    Comment: occasionlly  . Drug use: No  . Sexual activity: Not Currently  Other Topics Concern  . Not on file  Social History Narrative  . Not on file   Social Determinants of Health   Financial Resource Strain:   . Difficulty of Paying Living Expenses: Not on file  Food Insecurity:    . Worried About Charity fundraiser in the Last Year: Not on file  . Ran Out of Food in the Last Year: Not on file  Transportation Needs:   . Lack of Transportation (Medical): Not on file  . Lack of Transportation (Non-Medical): Not on file  Physical Activity:   . Days of Exercise per Week: Not on file  . Minutes of Exercise per Session: Not on file  Stress:   . Feeling of Stress : Not on file  Social Connections:   . Frequency of Communication with Friends and Family: Not on file  . Frequency of Social Gatherings with Friends and Family: Not on file  . Attends Religious Services: Not on file  . Active Member of Clubs or Organizations: Not on file  . Attends Archivist Meetings: Not on file  . Marital Status: Not on file  Intimate Partner Violence:   . Fear of Current or Ex-Partner: Not on file  . Emotionally Abused: Not on file  . Physically Abused: Not on file  . Sexually Abused: Not on file    Outpatient Medications Prior to Visit  Medication Sig Dispense Refill  . acetaminophen (TYLENOL) 500 MG tablet Take 1 tablet (500 mg total) by mouth every 6 (six) hours as needed. 30 tablet 0  . Blood Glucose Monitoring Suppl (ACCU-CHEK AVIVA PLUS) w/Device KIT 1 each by Does not apply route daily. 1 kit 0  . cetirizine (ZYRTEC) 10 MG tablet Take 1 tablet by mouth once daily 30 tablet 0  . cyclobenzaprine (FLEXERIL) 5 MG tablet Take 1 tablet (5 mg total) by mouth at bedtime. 30 tablet 1  . diclofenac Sodium (VOLTAREN) 1 % GEL APPLY 2 GRAMS TOPICALLY FOUR TIMES A DAY 350 g 0  . Dulaglutide (TRULICITY) 1.5 RA/0.7MA SOPN Inject 1.5 mg into the skin once a week. 1 pen 12  . EQ ASPIRIN ADULT LOW DOSE 81 MG EC tablet Take 1 tablet by mouth once daily 90 tablet 0  . ergocalciferol (VITAMIN D2) 1.25 MG (50000 UT) capsule Take 1 capsule (50,000 Units total) by mouth once a week. 12 capsule 3  . furosemide (LASIX) 40 MG tablet Take 1 tablet by mouth once daily 90 tablet 0  . gabapentin  (NEURONTIN) 100 MG capsule Take 1 capsule (100 mg total) by mouth at bedtime. 30 capsule 0  . glucose blood (ACCU-CHEK AVIVA) test strip Will check fasting blood sugar daily. Use as instructed 100 each 12  . HYDROcodone-acetaminophen (NORCO/VICODIN) 5-325 MG tablet Take 1-2 tablets by mouth every 6 (six) hours as needed for moderate pain. 30 tablet 0  . lisinopril (ZESTRIL) 20 MG tablet Take 1 tablet by mouth once daily 30 tablet 0  . meloxicam (MOBIC) 15 MG tablet Take 1 tablet (15 mg total) by mouth daily. 30 tablet 1  . metFORMIN (GLUCOPHAGE) 1000 MG tablet TAKE 1 TABLET BY MOUTH TWICE DAILY WITH MEALS 180 tablet 0  . NONFORMULARY OR COMPOUNDED ITEM Baclofen 2%,  cyclobenzaprine% diclofenac 3% and lidocaine2%    . omega-3 acid ethyl esters (LOVAZA) 1 g capsule Take 1 capsule (1 g total) by mouth 2 (two) times daily. 180 capsule 3  . potassium chloride (K-DUR) 10 MEQ tablet Take 1 tablet (10 mEq total) by mouth daily. 90 tablet 1  . rosuvastatin (CRESTOR) 20 MG tablet Take 1 tablet (20 mg total) by mouth daily. 30 tablet 5  . traMADol (ULTRAM) 50 MG tablet Take 1 tablet (50 mg total) by mouth every 6 (six) hours as needed. 30 tablet 0   No facility-administered medications prior to visit.    No Known Allergies  ROS Review of Systems    There were no vitals taken for this visit. Wt Readings from Last 3 Encounters:  03/14/20 217 lb 9.6 oz (98.7 kg)  12/31/19 232 lb (105.2 kg)  09/17/19 232 lb (105.2 kg)     Health Maintenance Due  Topic Date Due  . OPHTHALMOLOGY EXAM  12/03/2018  . FOOT EXAM  07/06/2019    There are no preventive care reminders to display for this patient.  Lab Results  Component Value Date   TSH 0.588 10/13/2018   Lab Results  Component Value Date   WBC 5.5 06/29/2016   HGB 11.4 (L) 06/29/2016   HCT 37.6 06/29/2016   MCV 78.0 (L) 06/29/2016   PLT 170 06/29/2016   Lab Results  Component Value Date   NA 144 03/14/2020   K 5.0 03/14/2020   CO2 28  03/14/2020   GLUCOSE 126 (H) 03/14/2020   BUN 22 03/14/2020   CREATININE 0.60 03/14/2020   BILITOT 0.6 03/14/2020   ALKPHOS 85 03/14/2020   AST 25 03/14/2020   ALT 37 (H) 03/14/2020   PROT 7.0 03/14/2020   ALBUMIN 4.4 03/14/2020   CALCIUM 9.5 03/14/2020   ANIONGAP 9 04/24/2015   Lab Results  Component Value Date   CHOL 142 10/13/2018   Lab Results  Component Value Date   HDL 53 10/13/2018   Lab Results  Component Value Date   LDLCALC 62 10/13/2018   Lab Results  Component Value Date   TRIG 134 10/13/2018   Lab Results  Component Value Date   CHOLHDL 2.7 10/13/2018   Lab Results  Component Value Date   HGBA1C 6.3 (A) 03/14/2020      Assessment & Plan:   Problem List Items Addressed This Visit      Cardiovascular and Mediastinum   Essential hypertension - Primary   Relevant Orders   Comprehensive metabolic panel     Other   Obesity, morbid ([place])   Hyperlipidemia   Relevant Orders   Lipid Panel    Other Visit Diagnoses    Type 2 diabetes mellitus without complication, without long-term current use of insulin ([facility])       Relevant Orders   Hemoglobin A1c   Comprehensive metabolic panel     1. Essential hypertension  - Comprehensive metabolic panel; Future  2. Hyperlipidemia, unspecified hyperlipidemia type  - Lipid Panel; Future  3. Type 2 diabetes mellitus without complication, without long-term current use of insulin ([facility])  A1C goal is less than 7. Your fasting blood sugar  Upon awakening goal is between 110-140.  LDL  (bad cholesterol goal is less than 100 Blood pressure goal is <140/90.  Recommend a lowfat, low carbohydrate diet divided over 5-6 small meals, increase water intake to 6-8 glasses, and 150 minutes per week of cardiovascular exercise.    - Hemoglobin A1c; Future -  Comprehensive metabolic panel; Future  4. Obesity, morbid ([place]) The patient is asked to make an attempt to improve diet and exercise patterns to aid in medical  management of this problem.  Follow Up Instructions:    I discussed the assessment and treatment plan with the patient. The patient was provided an opportunity to ask questions and all were answered. The patient agreed with the plan and demonstrated an understanding of the instructions.   The patient was advised to call back or seek an in-person evaluation if the symptoms worsen or if the condition fails to improve as anticipated.  I provided 10 minutes of non-face-to-face time during this encounter.   Donia Pounds  APRN, MSN, FNP-C [facility] [address] [place], [place] [address] [phone]

## 2020-05-04 ENCOUNTER — Other Ambulatory Visit: Payer: Self-pay | Admitting: Family Medicine

## 2020-05-04 DIAGNOSIS — I10 Essential (primary) hypertension: Secondary | ICD-10-CM

## 2020-05-05 NOTE — Telephone Encounter (Signed)
Please see refill request.

## 2020-07-24 ENCOUNTER — Other Ambulatory Visit: Payer: Self-pay | Admitting: Family Medicine

## 2020-07-24 DIAGNOSIS — I10 Essential (primary) hypertension: Secondary | ICD-10-CM

## 2020-07-28 ENCOUNTER — Other Ambulatory Visit: Payer: Self-pay

## 2020-07-28 DIAGNOSIS — I10 Essential (primary) hypertension: Secondary | ICD-10-CM

## 2020-07-28 DIAGNOSIS — E119 Type 2 diabetes mellitus without complications: Secondary | ICD-10-CM

## 2020-07-28 DIAGNOSIS — E559 Vitamin D deficiency, unspecified: Secondary | ICD-10-CM

## 2020-07-28 DIAGNOSIS — E785 Hyperlipidemia, unspecified: Secondary | ICD-10-CM

## 2020-07-28 DIAGNOSIS — I6523 Occlusion and stenosis of bilateral carotid arteries: Secondary | ICD-10-CM

## 2020-07-28 MED ORDER — FUROSEMIDE 40 MG PO TABS: 40.0000 mg | ORAL_TABLET | Freq: Every day | ORAL | 0 refills | Status: DC

## 2020-07-28 MED ORDER — TRULICITY 1.5 MG/0.5ML ~~LOC~~ SOAJ: 1.5000 mg | SUBCUTANEOUS | 0 refills | Status: DC

## 2020-07-28 MED ORDER — CELECOXIB 100 MG PO CAPS: 100.0000 mg | ORAL_CAPSULE | Freq: Two times a day (BID) | ORAL | 1 refills | Status: DC

## 2020-07-28 MED ORDER — ROSUVASTATIN CALCIUM 20 MG PO TABS: 20.0000 mg | ORAL_TABLET | Freq: Every day | ORAL | 5 refills | Status: DC

## 2020-07-28 MED ORDER — METHOCARBAMOL 750 MG PO TABS: 750.0000 mg | ORAL_TABLET | Freq: Four times a day (QID) | ORAL | 0 refills | Status: DC

## 2020-07-28 MED ORDER — ERGOCALCIFEROL 1.25 MG (50000 UT) PO CAPS: 50000.0000 [IU] | ORAL_CAPSULE | ORAL | 3 refills | Status: DC

## 2020-07-28 MED ORDER — LISINOPRIL 20 MG PO TABS: ORAL_TABLET | ORAL | 0 refills | Status: DC

## 2020-08-04 ENCOUNTER — Other Ambulatory Visit: Payer: Self-pay

## 2020-08-04 ENCOUNTER — Telehealth: Payer: Self-pay

## 2020-08-04 DIAGNOSIS — R0982 Postnasal drip: Secondary | ICD-10-CM

## 2020-08-04 DIAGNOSIS — E119 Type 2 diabetes mellitus without complications: Secondary | ICD-10-CM

## 2020-08-04 DIAGNOSIS — I6523 Occlusion and stenosis of bilateral carotid arteries: Secondary | ICD-10-CM

## 2020-08-04 DIAGNOSIS — E559 Vitamin D deficiency, unspecified: Secondary | ICD-10-CM

## 2020-08-04 DIAGNOSIS — I10 Essential (primary) hypertension: Secondary | ICD-10-CM

## 2020-08-04 DIAGNOSIS — E785 Hyperlipidemia, unspecified: Secondary | ICD-10-CM

## 2020-08-04 MED ORDER — MELOXICAM 15 MG PO TABS: 15.0000 mg | ORAL_TABLET | Freq: Every day | ORAL | 3 refills | Status: DC

## 2020-08-04 MED ORDER — TRULICITY 1.5 MG/0.5ML ~~LOC~~ SOAJ: 1.5000 mg | SUBCUTANEOUS | 3 refills | Status: DC

## 2020-08-04 MED ORDER — METHOCARBAMOL 750 MG PO TABS: 750.0000 mg | ORAL_TABLET | Freq: Four times a day (QID) | ORAL | 0 refills | Status: DC

## 2020-08-04 MED ORDER — LISINOPRIL 20 MG PO TABS
ORAL_TABLET | ORAL | 3 refills | Status: DC
Start: 2020-08-04 — End: 2020-11-20

## 2020-08-04 MED ORDER — ERGOCALCIFEROL 1.25 MG (50000 UT) PO CAPS: 50000.0000 [IU] | ORAL_CAPSULE | ORAL | 3 refills | Status: DC

## 2020-08-04 MED ORDER — FUROSEMIDE 40 MG PO TABS
40.0000 mg | ORAL_TABLET | Freq: Every day | ORAL | 3 refills | Status: AC
Start: 2020-08-04 — End: ?

## 2020-08-04 MED ORDER — CETIRIZINE HCL 10 MG PO TABS: 10.0000 mg | ORAL_TABLET | Freq: Every day | ORAL | 3 refills | Status: AC

## 2020-08-04 MED ORDER — ROSUVASTATIN CALCIUM 20 MG PO TABS: 20.0000 mg | ORAL_TABLET | Freq: Every day | ORAL | 3 refills | Status: DC

## 2020-08-04 NOTE — Telephone Encounter (Signed)
Pt asking if she will need she will need blood work the Friday before her appt. Also she will need to change her appt on 04/26 to Tele appt cause she will be out of town

## 2020-08-04 NOTE — Telephone Encounter (Signed)
Spoke with patient and lab appointment scheduled.  

## 2020-09-09 ENCOUNTER — Ambulatory Visit: Payer: Medicare PPO | Admitting: Family Medicine

## 2020-09-12 ENCOUNTER — Other Ambulatory Visit: Payer: Medicare PPO

## 2020-09-15 ENCOUNTER — Ambulatory Visit: Payer: Medicare PPO | Admitting: Podiatry

## 2020-09-16 ENCOUNTER — Telehealth: Payer: Medicare PPO | Admitting: Family Medicine

## 2020-09-16 ENCOUNTER — Telehealth (INDEPENDENT_AMBULATORY_CARE_PROVIDER_SITE_OTHER): Payer: Medicare PPO | Admitting: Family Medicine

## 2020-09-16 DIAGNOSIS — I10 Essential (primary) hypertension: Secondary | ICD-10-CM

## 2020-09-23 ENCOUNTER — Other Ambulatory Visit: Payer: Self-pay | Admitting: Family Medicine

## 2020-09-23 DIAGNOSIS — Z1231 Encounter for screening mammogram for malignant neoplasm of breast: Secondary | ICD-10-CM

## 2020-09-23 NOTE — Progress Notes (Signed)
Patient rescheduled

## 2020-10-07 IMAGING — MG DIGITAL SCREENING UNILAT RIGHT W/ TOMO W/ CAD
6 series · 6 of 18 positions shown · non-contrast
Comparison: Previous exam(s).

CLINICAL DATA: Screening.

EXAM:
DIGITAL SCREENING UNILATERAL RIGHT MAMMOGRAM WITH CAD AND TOMO

[R MLO synth-2D (1 of 2)]
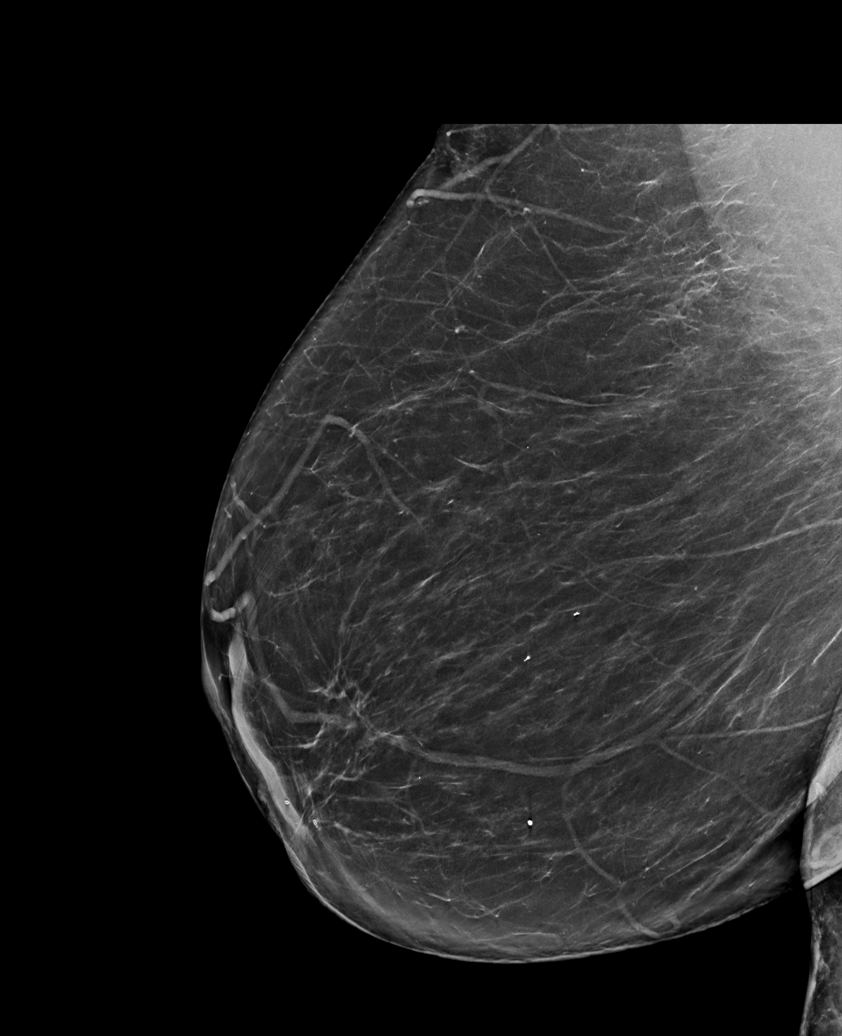

[R CC synth-2D]
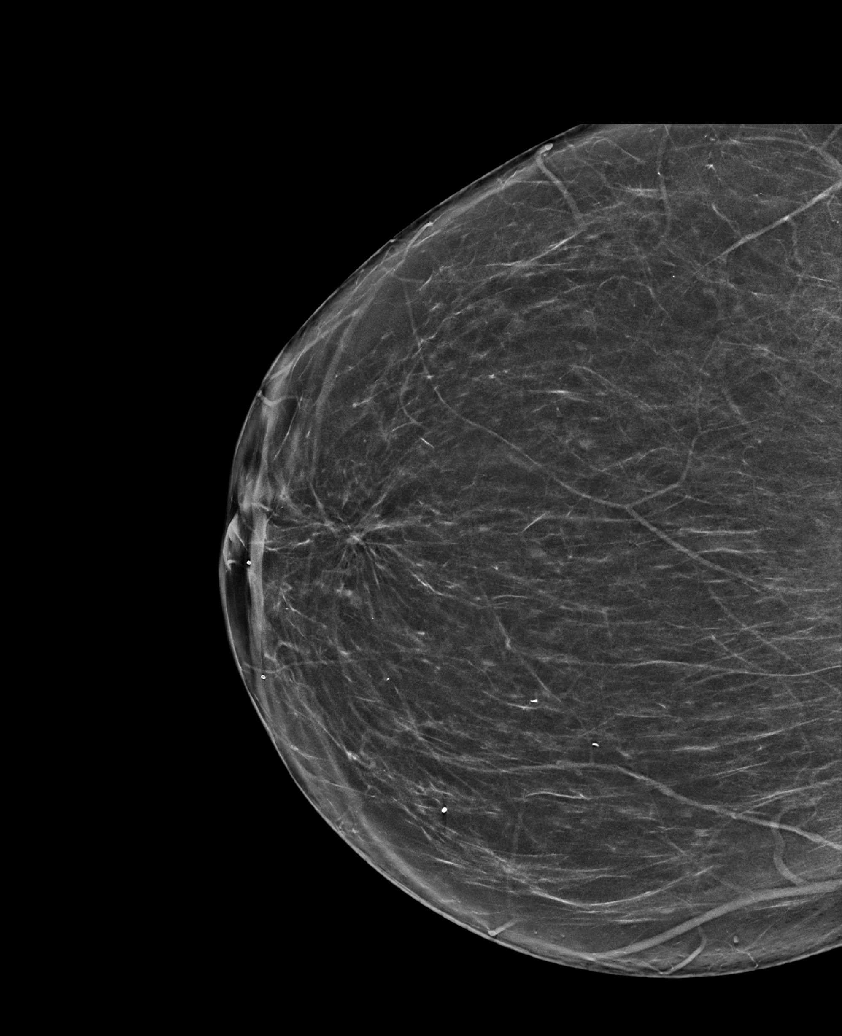

[R MLO synth-2D (2 of 2)]
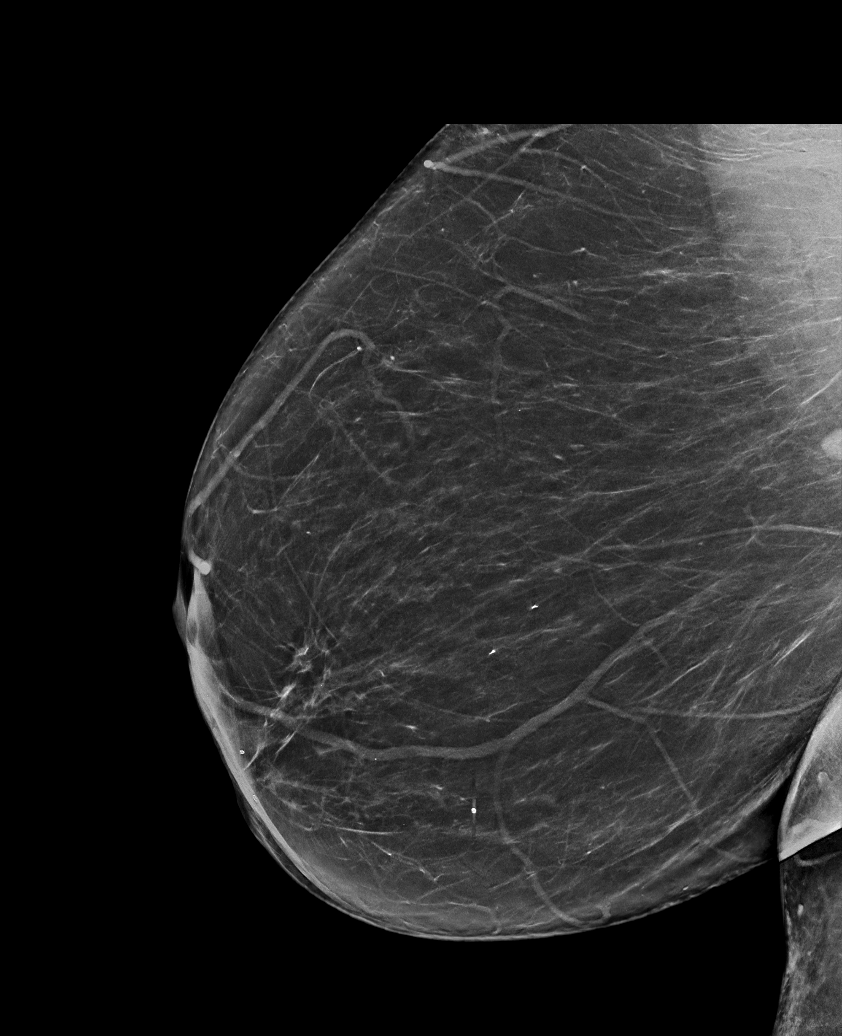

[R MLO tomo (1 of 2) · tomo slice 41/81.0]
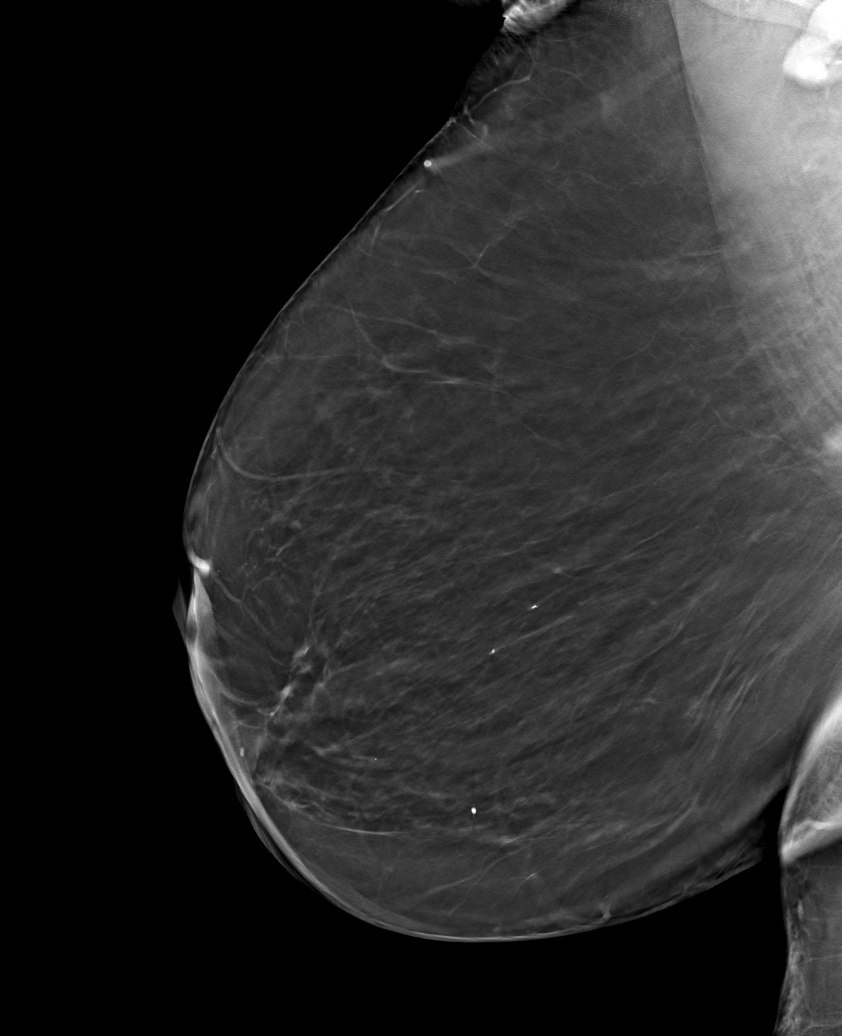

[R CC tomo · tomo slice 33/65.0]
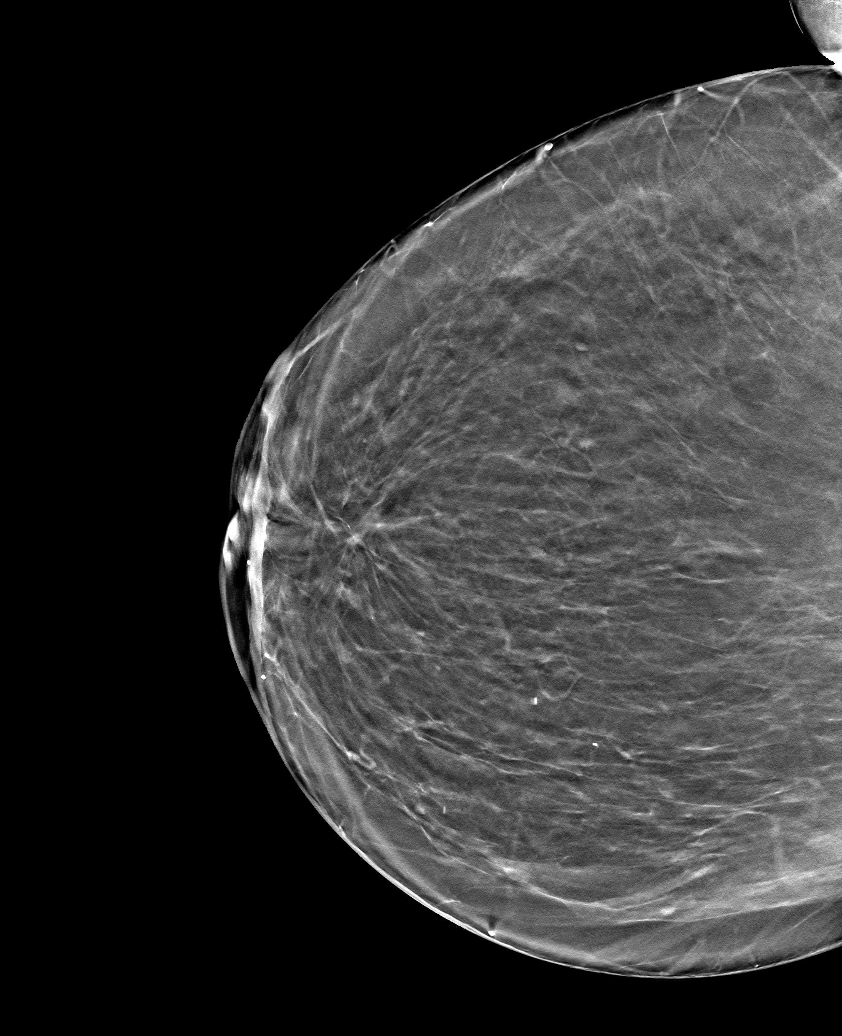

[R MLO tomo (2 of 2) · tomo slice 41/81.0]
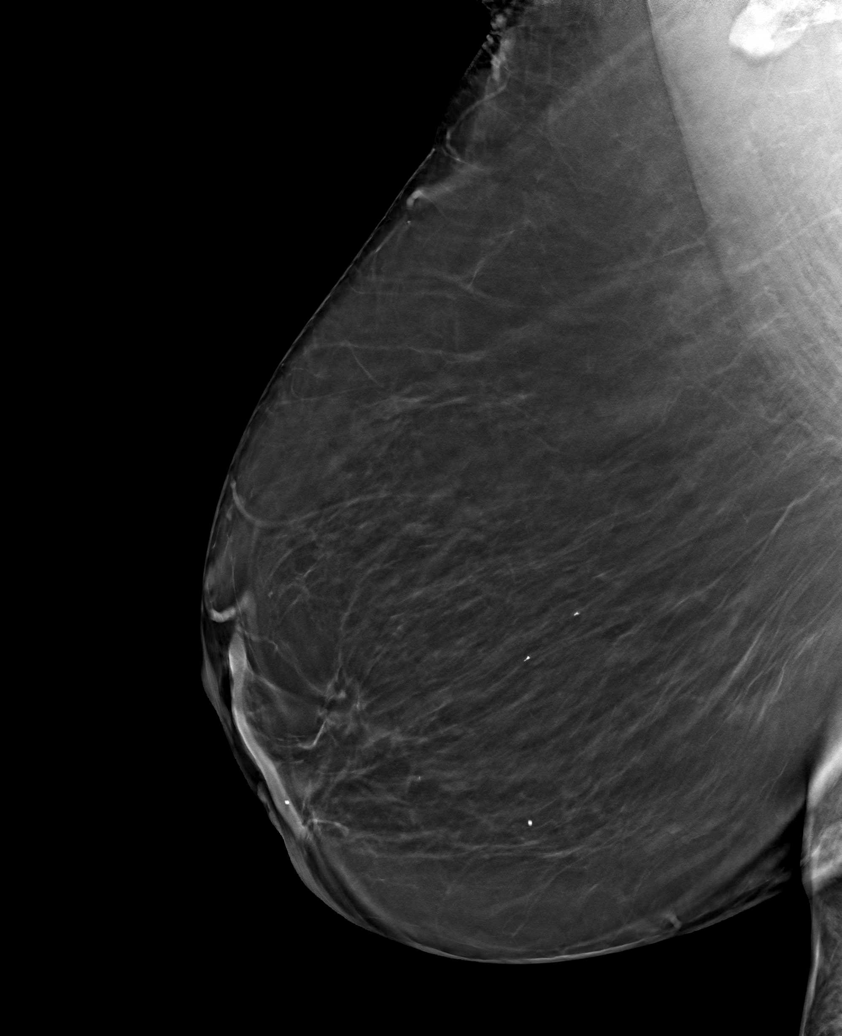

[6 of 18 positions shown; findings below may reference images not displayed]

ACR Breast Density Category b: There are scattered areas of
fibroglandular density.
FINDINGS: There are no findings suspicious for malignancy. Images were
processed with CAD.
IMPRESSION: No mammographic evidence of malignancy. A result letter of this
screening mammogram will be mailed directly to the patient.

RECOMMENDATION:
Screening mammogram in one year. (Code:3Y-2-NJC)

BI-RADS CATEGORY  1: Negative.

## 2020-11-18 ENCOUNTER — Ambulatory Visit: Payer: Medicare PPO

## 2020-11-19 ENCOUNTER — Ambulatory Visit (INDEPENDENT_AMBULATORY_CARE_PROVIDER_SITE_OTHER): Payer: Medicare PPO | Admitting: Nurse Practitioner

## 2020-11-19 ENCOUNTER — Other Ambulatory Visit: Payer: Self-pay

## 2020-11-19 ENCOUNTER — Ambulatory Visit: Payer: Medicare PPO | Admitting: Podiatry

## 2020-11-19 VITALS — BP 144/59 | HR 61 | Temp 97.9°F | Resp 18 | Ht 60.0 in | Wt 220.0 lb

## 2020-11-19 DIAGNOSIS — M79674 Pain in right toe(s): Secondary | ICD-10-CM | POA: Diagnosis not present

## 2020-11-19 DIAGNOSIS — M65172 Other infective (teno)synovitis, left ankle and foot: Secondary | ICD-10-CM | POA: Diagnosis not present

## 2020-11-19 DIAGNOSIS — M79675 Pain in left toe(s): Secondary | ICD-10-CM | POA: Diagnosis not present

## 2020-11-19 DIAGNOSIS — M659 Synovitis and tenosynovitis, unspecified: Secondary | ICD-10-CM

## 2020-11-19 DIAGNOSIS — Z0000 Encounter for general adult medical examination without abnormal findings: Secondary | ICD-10-CM | POA: Diagnosis not present

## 2020-11-19 DIAGNOSIS — E0843 Diabetes mellitus due to underlying condition with diabetic autonomic (poly)neuropathy: Secondary | ICD-10-CM | POA: Diagnosis not present

## 2020-11-19 DIAGNOSIS — B351 Tinea unguium: Secondary | ICD-10-CM | POA: Diagnosis not present

## 2020-11-19 NOTE — Progress Notes (Signed)
   SUBJECTIVE Patient with a history of diabetes mellitus presents to office today complaining of elongated, thickened nails that cause pain while ambulating in shoes.  Patient is unable to trim their own nails. Patient is here for further evaluation and treatment. Patient also states that she has had a mild flareup of left ankle pain.  She denies a history of injury.  She has had this in the past and she states that an ankle brace has helped significantly.  She currently has not done anything for treatment.  She currently also does not have an ankle brace   Past Medical History:  Diagnosis Date   Arthritis    knees   Cancer ([facility]) 2000   left breast   Diabetes mellitus without complication ([facility])    Hyperlipemia    Hypertension    Sleep apnea    has CPAP but does not wear every night   Venous insufficiency of both lower extremities 12/2016   Bilateral femoral and small saphenous vein incompetence.  Right saphenofemoral junction and right greater saphenous vein incompetence.    OBJECTIVE General Patient is awake, alert, and oriented x 3 and in no acute distress. Derm Skin is dry and supple bilateral. Negative open lesions or macerations. Remaining integument unremarkable. Nails are tender, long, thickened and dystrophic with subungual debris, consistent with onychomycosis, 1-5 bilateral. No signs of infection noted. Vasc  DP and PT pedal pulses palpable bilaterally. Temperature gradient within normal limits.  Neuro Epicritic and protective threshold sensation diminished bilaterally.  Musculoskeletal Exam No symptomatic pedal deformities noted bilateral. Muscular strength within normal limits.  There is some pain on palpation to the medial and lateral aspect of the left ankle joint.  Slight pain with range of motion as well  ASSESSMENT 1. Diabetes Mellitus w/ peripheral neuropathy 2.  Pain due to onychomycosis of toenails bilateral 3.  Synovitis left ankle  PLAN OF CARE 1. Patient  evaluated today. 2. Instructed to maintain good pedal hygiene and foot care. Stressed importance of controlling blood sugar.  3. Mechanical debridement of nails 1-5 bilaterally performed using a nail nipper. Filed with dremel without incident.  4.  Ankle brace dispensed to wear to the left ankle.  Patient states that she is received 1 in the past and it helped significantly  5.  Return to clinic in 3 mos.     Edrick Kins, DPM [facility]  Dr. Edrick Kins, DPM    [address], [place] [address]                Office [phone]  Fax [phone]

## 2020-11-19 NOTE — Patient Instructions (Addendum)
Erin House , Thank you for taking time to come for your Medicare Wellness Visit. I appreciate your ongoing commitment to your health goals. Please review the following plan we discussed and let me know if I can assist you in the future.   These are the goals we discussed:  Goals     . DIET - REDUCE FAT INTAKE        This is a list of the screening recommended for you and due dates:  Health Maintenance  Topic Date Due  . Eye exam for diabetics  12/03/2018  . Complete foot exam   07/06/2019  . Zoster (Shingles) Vaccine (2 of 2) 07/11/2019  . Hemoglobin A1C  09/12/2020  . COVID-19 Vaccine (4 - Booster for Pfizer series) 12/26/2020  . Mammogram  11/06/2021  . Colon Cancer Screening  04/30/2024  . Tetanus Vaccine  07/15/2024  . DEXA scan (bone density measurement)  Completed  . Hepatitis C Screening: USPSTF Recommendation to screen - Ages 74-79 yo.  Completed  . HPV Vaccine  Aged Out     Fat and Cholesterol Restricted Eating Plan Getting too much fat and cholesterol in your diet may cause health problems. Choosing the right foods helps keep your fat and cholesterol at normal levels.This can keep you from getting certain diseases. Your doctor may recommend an eating plan that includes: Total fat: ______% or less of total calories a day. Saturated fat: ______% or less of total calories a day. Cholesterol: less than _________mg a day. Fiber: ______g a day. What are tips for following this plan? Meal planning At meals, divide your plate into four equal parts: Fill one-half of your plate with vegetables and green salads. Fill one-fourth of your plate with whole grains. Fill one-fourth of your plate with low-fat (lean) protein foods. Eat fish that is high in omega-3 fats at least two times a week. This includes mackerel, tuna, sardines, and salmon. Eat foods that are high in fiber, such as whole grains, beans, apples, broccoli, carrots, peas, and barley. General tips  Work with  your doctor to lose weight if you need to. Avoid: Foods with added sugar. Fried foods. Foods with partially hydrogenated oils. Limit alcohol intake to no more than 1 drink a day for nonpregnant women and 2 drinks a day for men. One drink equals 12 oz of beer, 5 oz of wine, or 1 oz of hard liquor.  Reading food labels Check food labels for: Trans fats. Partially hydrogenated oils. Saturated fat (g) in each serving. Cholesterol (mg) in each serving. Fiber (g) in each serving. Choose foods with healthy fats, such as: Monounsaturated fats. Polyunsaturated fats. Omega-3 fats. Choose grain products that have whole grains. Look for the word "whole" as the first word in the ingredient list. Cooking Cook foods using low-fat methods. These include baking, boiling, grilling, and broiling. Eat more home-cooked foods. Eat at restaurants and buffets less often. Avoid cooking using saturated fats, such as butter, cream, palm oil, palm kernel oil, and coconut oil. Recommended foods  Fruits All fresh, canned (in natural juice), or frozen fruits. Vegetables Fresh or frozen vegetables (raw, steamed, roasted, or grilled). Green salads. Grains Whole grains, such as whole wheat or whole grain breads, crackers, cereals, and pasta. Unsweetened oatmeal, bulgur, barley, quinoa, or brown rice. Corn or whole wheat flour tortillas. Meats and other protein foods Ground beef (85% or leaner), grass-fed beef, or beef trimmed of fat. Skinless chicken or [place]. Ground chicken or [place]. Pork trimmed of fat. All  fish and seafood. Egg whites. Dried beans, peas, or lentils. Unsalted nuts or seeds. Unsalted canned beans. Nut butters without added sugar or oil. Dairy Low-fat or nonfat dairy products, such as skim or 1% milk, 2% or reduced-fat cheeses, low-fat and fat-free ricotta or cottage cheese, or plain low-fat and nonfat yogurt. Fats and oils Tub margarine without trans fats. Light or reduced-fat mayonnaise and  salad dressings. Avocado. Olive, canola, sesame, or safflower oils. The items listed above may not be a complete list of foods and beverages youcan eat. Contact a dietitian for more information. Foods to avoid Fruits Canned fruit in heavy syrup. Fruit in cream or butter sauce. Fried fruit. Vegetables Vegetables cooked in cheese, cream, or butter sauce. Fried vegetables. Grains White bread. White pasta. White rice. Cornbread. Bagels, pastries, and croissants. Crackers and snack foods that contain trans fat and hydrogenated oils. Meats and other protein foods Fatty cuts of meat. Ribs, chicken wings, bacon, sausage, bologna, salami, chitterlings, fatback, hot dogs, bratwurst, and packaged lunch meats. Liver and organ meats. Whole eggs and egg yolks. Chicken and [place] with skin. Fried meat. Dairy Whole or 2% milk, cream, half-and-half, and cream cheese. Whole milk cheeses. Whole-fat or sweetened yogurt. Full-fat cheeses. Nondairy creamers and whipped toppings. Processed cheese, cheese spreads, and cheese curds. Beverages Alcohol. Sugar-sweetened drinks such as sodas, lemonade, and fruit drinks. Fats and oils Butter, stick margarine, lard, shortening, ghee, or bacon fat. Coconut, palm kernel, and palm oils. Sweets and desserts Corn syrup, sugars, honey, and molasses. Candy. Jam and jelly. Syrup. Sweetened cereals. Cookies, pies, cakes, donuts, muffins, and ice cream. The items listed above may not be a complete list of foods and beverages youshould avoid. Contact a dietitian for more information. Summary Choosing the right foods helps keep your fat and cholesterol at normal levels. This can keep you from getting certain diseases. At meals, fill one-half of your plate with vegetables and green salads. Eat high-fiber foods, like whole grains, beans, apples, carrots, peas, and barley. Limit added sugar, saturated fats, alcohol, and fried foods. This information is not intended to replace advice  given to you by your health care provider. Make sure you discuss any questions you have with your healthcare provider. Document Revised: 09/12/2019 Document Reviewed: 09/12/2019 Elsevier Patient Education  2022 [place].  Diabetes Mellitus and [place] care is an important part of your health, especially when you have diabetes. Diabetes may cause you to have problems because of poor blood flow (circulation) to your feet and legs, which can cause your skin to: Become thinner and drier. Break more easily. Heal more slowly. Peel and crack. You may also have nerve damage (neuropathy) in your legs and feet, causing decreased feeling in them. This means that you may not notice minor injuries to your feet that could lead to more serious problems. Noticing and addressing any potential problems early is the best wayto prevent future foot problems. How to care for your feet Foot hygiene  Wash your feet daily with warm water and mild soap. Do not use hot water. Then, pat your feet and the areas between your toes until they are completely dry. Do not soak your feet as this can dry your skin. Trim your toenails straight across. Do not dig under them or around the cuticle. File the edges of your nails with an emery board or nail file. Apply a moisturizing lotion or petroleum jelly to the skin on your feet and to dry, brittle toenails. Use lotion that does  not contain alcohol and is unscented. Do not apply lotion between your toes.  Shoes and socks Wear clean socks or stockings every day. Make sure they are not too tight. Do not wear knee-high stockings since they may decrease blood flow to your legs. Wear shoes that fit properly and have enough cushioning. Always look in your shoes before you put them on to be sure there are no objects inside. To break in new shoes, wear them for just a few hours a day. This prevents injuries on your feet. Wounds, scrapes, corns, and calluses  Check your feet  daily for blisters, cuts, bruises, sores, and redness. If you cannot see the bottom of your feet, use a mirror or ask someone for help. Do not cut corns or calluses or try to remove them with medicine. If you find a minor scrape, cut, or break in the skin on your feet, keep it and the skin around it clean and dry. You may clean these areas with mild soap and water. Do not clean the area with peroxide, alcohol, or iodine. If you have a wound, scrape, corn, or callus on your foot, look at it several times a day to make sure it is healing and not infected. Check for: Redness, swelling, or pain. Fluid or blood. Warmth. Pus or a bad smell.  General tips Do not cross your legs. This may decrease blood flow to your feet. Do not use heating pads or hot water bottles on your feet. They may burn your skin. If you have lost feeling in your feet or legs, you may not know this is happening until it is too late. Protect your feet from hot and cold by wearing shoes, such as at the beach or on hot pavement. Schedule a complete foot exam at least once a year (annually) or more often if you have foot problems. Report any cuts, sores, or bruises to your health care provider immediately. Where to find more information American Diabetes Association: www.diabetes.org Association of Diabetes Care & Education Specialists: www.diabeteseducator.org Contact a health care provider if: You have a medical condition that increases your risk of infection and you have any cuts, sores, or bruises on your feet. You have an injury that is not healing. You have redness on your legs or feet. You feel burning or tingling in your legs or feet. You have pain or cramps in your legs and feet. Your legs or feet are numb. Your feet always feel cold. You have pain around any toenails. Get help right away if: You have a wound, scrape, corn, or callus on your foot and: You have pain, swelling, or redness that gets worse. You have  fluid or blood coming from the wound, scrape, corn, or callus. Your wound, scrape, corn, or callus feels warm to the touch. You have pus or a bad smell coming from the wound, scrape, corn, or callus. You have a fever. You have a red line going up your leg. Summary Check your feet every day for blisters, cuts, bruises, sores, and redness. Apply a moisturizing lotion or petroleum jelly to the skin on your feet and to dry, brittle toenails. Wear shoes that fit properly and have enough cushioning. If you have foot problems, report any cuts, sores, or bruises to your health care provider immediately. Schedule a complete foot exam at least once a year (annually) or more often if you have foot problems. This information is not intended to replace advice given to you by your health  care provider. Make sure you discuss any questions you have with your healthcare provider. Document Revised: 11/29/2019 Document Reviewed: 11/29/2019 Elsevier Patient Education  2022 [place] for Type 2 Diabetes  A screening test for type 2 diabetes (type 2 diabetes mellitus) is a blood test to measure your blood sugar (glucose) level. This test is done to check for early signs of diabetes, before youdevelop symptoms.  Type 2 diabetes is a long-term (chronic) disease. In type 2 diabetes, one or both of these problems may be present: The pancreas does not make enough of a hormone called insulin. Cells in the body do not respond properly to insulin that the body makes (insulin resistance). Normally, insulin allows blood sugar (glucose) to enter cells in the body. The cells use glucose for energy. Insulin resistance or lack of insulin causes excess glucose to build up in the blood instead of going into cells. This results in high blood glucose levels (hyperglycemia), which can cause many complications. You may be screened for type 2 diabetes as part of your regular health care, especially if you have a high  risk for diabetes. Screening can help to identify type 2 diabetes at its early stage (prediabetes). Identifying and treating prediabetes may delay or prevent the development oftype 2 diabetes. What are the risk factors for type 2 diabetes? The following factors may make you more likely to develop type 2 diabetes: Having a parent or sibling (first-degree relative) who has diabetes. Being overweight or obese. Being of American-Indian, African-American, Hispanic, Latino, Asian, or [place] descent. Not getting enough exercise (having a sedentary lifestyle). Being older than age 63. Having a history of diabetes during pregnancy (gestational diabetes). Having low levels of good cholesterol (HDL-C) or high levels of blood fats (triglycerides). Having high blood glucose in a previous blood test. Having high blood pressure. Having certain diseases or conditions that may be caused by insulin resistance, including: Acanthosis nigricans. This is a condition that causes dark skin on the neck, armpits, and groin. Polycystic ovary syndrome (PCOS). Cardiovascular heart disease. Who should be screened for type 2 diabetes? Adults Adults age 9 and older. These adults should be screened once every three years. Adults who are younger than age 4, are overweight, and have one other risk factor. These adults should be screened once every three years. Adults who have normal blood glucose levels and two or more risk factors. These adults may be screened once every year (annually). Women who have had gestational diabetes in the past. These women should be screened once every three years. Pregnant women who have risk factors. These women should be screened at their first prenatal visit and again between weeks 24 and 28 of pregnancy. Children and adolescents Children and adolescents should be screened for type 2 diabetes if they are overweight and have any of the following risk factors: A family history of  type 2 diabetes. Being a member of a high-risk ethnic group. Signs of insulin resistance or conditions that are associated with insulin resistance. A mother who had gestational diabetes while pregnant with him or her. Screening should be done at least once every three years, starting at age 22 or at the onset of puberty, whichever comes first. Your health care provider or your child's health care provider may recommendhaving a screening more or less often. What happens during screening? During screening, your health care provider may ask questions about: Your health and your risk factors, including your activity level and any medical conditions that  you have. The health of your first-degree relatives. Past pregnancies, if this applies. Your health care provider will also do a physical exam, including a blood pressure measurement and blood tests. There are four blood tests that can be used to screen for type 2 diabetes. You may have one or more of the following: A fasting blood glucose (FBG) test. You will not be allowed to eat (you will fast) for 8 hours or more before a blood sample is taken. A random blood glucose test. This test checks your blood glucose at any time of the day regardless of when you ate. An oral glucose tolerance test (OGTT). This test measures your blood glucose at two times: After you have not eaten (have fasted) overnight. This is your baseline glucose level. Two hours after you drink a glucose-containing beverage. An A1c (hemoglobin A1c) blood test. This test provides information about blood glucose control over the previous 2-3 months. What do the results mean? Your test results are a measurement of how much glucose is in your blood. Normal blood glucose levels mean that you do not have diabetes or prediabetes. High blood glucose levels may mean that you have prediabetes or diabetes.Depending on the results, other tests may be needed to confirm the diagnosis. You may be  diagnosed with type 2 diabetes if: Your FBG level is 126 mg/dL (7.0 mmol/L) or higher. Your random blood glucose level is 200 mg/dL (11.1 mmol/L) or higher. Your A1c level is 6.5% or higher. Your OGTT result is higher than 200 mg/dL (11.1 mmol/L). These blood tests may be repeated to confirm your diagnosis. Talk with yourhealth care provider about what your results mean. Summary A screening test for type 2 diabetes (type 2 diabetes mellitus) is a blood test to measure your blood sugar (glucose) level. Know what your risk factors are for developing type 2 diabetes. If you are at risk, get screening tests as often as told by your health care provider. Screening may help you identify type 2 diabetes at its early stage (prediabetes). Identifying and treating prediabetes may delay or prevent the development of type 2 diabetes. This information is not intended to replace advice given to you by your health care provider. Make sure you discuss any questions you have with your healthcare provider. Document Revised: 04/23/2020 Document Reviewed: 12/05/2019 Elsevier Patient Education  2022 [place] Prevention in the Home, Adult Falls can cause injuries and can happen to people of all ages. There are many things you can do to make your home safe and to help prevent falls. Ask forhelp when making these changes. What actions can I take to prevent falls? General Instructions Use good lighting in all rooms. Replace any light bulbs that burn out. Turn on the lights in dark areas. Use night-lights. Keep items that you use often in easy-to-reach places. Lower the shelves around your home if needed. Set up your furniture so you have a clear path. Avoid moving your furniture around. Do not have throw rugs or other things on the floor that can make you trip. Avoid walking on wet floors. If any of your floors are uneven, fix them. Add color or contrast paint or tape to clearly mark and help you  see: Grab bars or handrails. First and last steps of staircases. Where the edge of each step is. If you use a stepladder: Make sure that it is fully opened. Do not climb a closed stepladder. Make sure the sides of the stepladder are locked in  place. Ask someone to hold the stepladder while you use it. Know where your pets are when moving through your home. What can I do in the bathroom?     Keep the floor dry. Clean up any water on the floor right away. Remove soap buildup in the tub or shower. Use nonskid mats or decals on the floor of the tub or shower. Attach bath mats securely with double-sided, nonslip rug tape. If you need to sit down in the shower, use a plastic, nonslip stool. Install grab bars by the toilet and in the tub and shower. Do not use towel bars as grab bars. What can I do in the bedroom? Make sure that you have a light by your bed that is easy to reach. Do not use any sheets or blankets for your bed that hang to the floor. Have a firm chair with side arms that you can use for support when you get dressed. What can I do in the kitchen? Clean up any spills right away. If you need to reach something above you, use a step stool with a grab bar. Keep electrical cords out of the way. Do not use floor polish or wax that makes floors slippery. What can I do with my stairs? Do not leave any items on the stairs. Make sure that you have a light switch at the top and the bottom of the stairs. Make sure that there are handrails on both sides of the stairs. Fix handrails that are broken or loose. Install nonslip stair treads on all your stairs. Avoid having throw rugs at the top or bottom of the stairs. Choose a carpet that does not hide the edge of the steps on the stairs. Check carpeting to make sure that it is firmly attached to the stairs. Fix carpet that is loose or worn. What can I do on the outside of my home? Use bright outdoor lighting. Fix the edges of walkways  and driveways and fix any cracks. Remove anything that might make you trip as you walk through a door, such as a raised step or threshold. Trim any bushes or trees on paths to your home. Check to see if handrails are loose or broken and that both sides of all steps have handrails. Install guardrails along the edges of any raised decks and porches. Clear paths of anything that can make you trip, such as tools or rocks. Have leaves, snow, or ice cleared regularly. Use sand or salt on paths during winter. Clean up any spills in your garage right away. This includes grease or oil spills. What other actions can I take? Wear shoes that: Have a low heel. Do not wear high heels. Have rubber bottoms. Feel good on your feet and fit well. Are closed at the toe. Do not wear open-toe sandals. Use tools that help you move around if needed. These include: Canes. Walkers. Scooters. Crutches. Review your medicines with your doctor. Some medicines can make you feel dizzy. This can increase your chance of falling. Ask your doctor what else you can do to help prevent falls. Where to find more information Centers for Disease Control and Prevention, [place]: http://www.wolf.info/ National Institute on Aging: http://kim-miller.com/ Contact a doctor if: You are afraid of falling at home. You feel weak, drowsy, or dizzy at home. You fall at home. Summary There are many simple things that you can do to make your home safe and to help prevent falls. Ways to make your home safe include removing  things that can make you trip and installing grab bars in the bathroom. Ask for help when making these changes in your home. This information is not intended to replace advice given to you by your health care provider. Make sure you discuss any questions you have with your healthcare provider. Document Revised: 12/12/2019 Document Reviewed: 12/12/2019 Elsevier Patient Education  [place] Maintenance,  Female Adopting a healthy lifestyle and getting preventive care are important in promoting health and wellness. Ask your health care provider about: The right schedule for you to have regular tests and exams. Things you can do on your own to prevent diseases and keep yourself healthy. What should I know about diet, weight, and exercise? Eat a healthy diet  Eat a diet that includes plenty of vegetables, fruits, low-fat dairy products, and lean protein. Do not eat a lot of foods that are high in solid fats, added sugars, or sodium.  Maintain a healthy weight Body mass index (BMI) is used to identify weight problems. It estimates body fat based on height and weight. Your health care provider can help determineyour BMI and help you achieve or maintain a healthy weight. Get regular exercise Get regular exercise. This is one of the most important things you can do for your health. Most adults should: Exercise for at least 150 minutes each week. The exercise should increase your heart rate and make you sweat (moderate-intensity exercise). Do strengthening exercises at least twice a week. This is in addition to the moderate-intensity exercise. Spend less time sitting. Even light physical activity can be beneficial. Watch cholesterol and blood lipids Have your blood tested for lipids and cholesterol at 72 years of age, then havethis test every 5 years. Have your cholesterol levels checked more often if: Your lipid or cholesterol levels are high. You are older than 72 years of age. You are at high risk for heart disease. What should I know about cancer screening? Depending on your health history and family history, you may need to have cancer screening at various ages. This may include screening for: Breast cancer. Cervical cancer. Colorectal cancer. Skin cancer. Lung cancer. What should I know about heart disease, diabetes, and high blood pressure? Blood pressure and heart disease High blood  pressure causes heart disease and increases the risk of stroke. This is more likely to develop in people who have high blood pressure readings, are of African descent, or are overweight. Have your blood pressure checked: Every 3-5 years if you are 34-75 years of age. Every year if you are 38 years old or older. Diabetes Have regular diabetes screenings. This checks your fasting blood sugar level. Have the screening done: Once every three years after age 39 if you are at a normal weight and have a low risk for diabetes. More often and at a younger age if you are overweight or have a high risk for diabetes. What should I know about preventing infection? Hepatitis B If you have a higher risk for hepatitis B, you should be screened for this virus. Talk with your health care provider to find out if you are at risk forhepatitis B infection. Hepatitis C Testing is recommended for: Everyone born from [birth year] through 1967. Anyone with known risk factors for hepatitis C. Sexually transmitted infections (STIs) Get screened for STIs, including gonorrhea and chlamydia, if: You are sexually active and are younger than 72 years of age. You are older than 72 years of age and your health care provider tells  you that you are at risk for this type of infection. Your sexual activity has changed since you were last screened, and you are at increased risk for chlamydia or gonorrhea. Ask your health care provider if you are at risk. Ask your health care provider about whether you are at high risk for HIV. Your health care provider may recommend a prescription medicine to help prevent HIV infection. If you choose to take medicine to prevent HIV, you should first get tested for HIV. You should then be tested every 3 months for as long as you are taking the medicine. Pregnancy If you are about to stop having your period (premenopausal) and you may become pregnant, seek counseling before you get pregnant. Take 400 to 800  micrograms (mcg) of folic acid every day if you become pregnant. Ask for birth control (contraception) if you want to prevent pregnancy. Osteoporosis and menopause Osteoporosis is a disease in which the bones lose minerals and strength with aging. This can result in bone fractures. If you are 95 years old or older, or if you are at risk for osteoporosis and fractures, ask your health care provider if you should: Be screened for bone loss. Take a calcium or vitamin D supplement to lower your risk of fractures. Be given hormone replacement therapy (HRT) to treat symptoms of menopause. Follow these instructions at home: Lifestyle Do not use any products that contain nicotine or tobacco, such as cigarettes, e-cigarettes, and chewing tobacco. If you need help quitting, ask your health care provider. Do not use street drugs. Do not share needles. Ask your health care provider for help if you need support or information about quitting drugs. Alcohol use Do not drink alcohol if: Your health care provider tells you not to drink. You are pregnant, may be pregnant, or are planning to become pregnant. If you drink alcohol: Limit how much you use to 0-1 drink a day. Limit intake if you are breastfeeding. Be aware of how much alcohol is in your drink. In the [place] one drink equals one 12 oz bottle of beer (355 mL), one 5 oz glass of wine (148 mL), or one 1 oz glass of hard liquor (44 mL). General instructions Schedule regular health, dental, and eye exams. Stay current with your vaccines. Tell your health care provider if: You often feel depressed. You have ever been abused or do not feel safe at home. Summary Adopting a healthy lifestyle and getting preventive care are important in promoting health and wellness. Follow your health care provider's instructions about healthy diet, exercising, and getting tested or screened for diseases. Follow your health care provider's instructions on monitoring  your cholesterol and blood pressure. This information is not intended to replace advice given to you by your health care provider. Make sure you discuss any questions you have with your healthcare provider. Document Revised: 05/03/2018 Document Reviewed: 05/03/2018 Elsevier Patient Education  2022 [place].  Steps to Quit Smoking Smoking tobacco is the leading cause of preventable death. It can affect almost every organ in the body. Smoking puts you and those around you at risk for developing many serious chronic diseases. Quitting smoking can be difficult, but it is one of the best things that you can do for your health. It is nevertoo late to quit. How do I get ready to quit? When you decide to quit smoking, create a plan to help you succeed. Before you quit: Pick a date to quit. Set a date within the next 2  weeks to give you time to prepare. Write down the reasons why you are quitting. Keep this list in places where you will see it often. Tell your family, friends, and co-workers that you are quitting. Support from your loved ones can make quitting easier. Talk with your health care provider about your options for quitting smoking. Find out what treatment options are covered by your health insurance. Identify people, places, things, and activities that make you want to smoke (triggers). Avoid them. What first steps can I take to quit smoking? Throw away all cigarettes at home, at work, and in your car. Throw away smoking accessories, such as Scientist, research (medical). Clean your car. Make sure to empty the ashtray. Clean your home, including curtains and carpets. What strategies can I use to quit smoking? Talk with your health care provider about combining strategies, such as taking medicines while you are also receiving in-person counseling. Using these two strategies together makes you more likely to succeed in quitting than if you used either strategy on its own. If you are pregnant or  breastfeeding, talk with your health care provider about finding counseling or other support strategies to quit smoking. Do not take medicine to help you quit smoking unless your health care provider tells you to do so. To quit smoking: Quit right away Quit smoking completely, instead of gradually reducing how much you smoke over a period of time. Research shows that stopping smoking right away is more successful than gradually quitting. Attend in-person counseling to help you build problem-solving skills. You are more likely to succeed in quitting if you attend counseling sessions regularly. Even short sessions of 10 minutes can be effective. Take medicine You may take medicines to help you quit smoking. Some medicines require a prescription and some you can purchase over-the-counter. Medicines may have nicotine in them to replace the nicotine in cigarettes. Medicines may: Help to stop cravings. Help to relieve withdrawal symptoms. Your health care provider may recommend: Nicotine patches, gum, or lozenges. Nicotine inhalers or sprays. Non-nicotine medicine that is taken by mouth. Find resources Find resources and support systems that can help you to quit smoking and remain smoke-free after you quit. These resources are most helpful when you use them often. They include: Online chats with a Social worker. Telephone quitlines. Printed Furniture conservator/restorer. Support groups or group counseling. Text messaging programs. Mobile phone apps or applications. Use apps that can help you stick to your quit plan by providing reminders, tips, and encouragement. There are many free apps for mobile devices as well as websites. Examples include Quit Guide from the [employer] and smokefree.gov What things can I do to make it easier to quit?  Reach out to your family and friends for support and encouragement. Call telephone quitlines (1-800-QUIT-NOW), reach out to support groups, or work with a counselor for support. Ask  people who smoke to avoid smoking around you. Avoid places that trigger you to smoke, such as bars, parties, or smoke-break areas at work. Spend time with people who do not smoke. Lessen the stress in your life. Stress can be a smoking trigger for some people. To lessen stress, try: Exercising regularly. Doing deep-breathing exercises. Doing yoga. Meditating. Performing a body scan. This involves closing your eyes, scanning your body from head to toe, and noticing which parts of your body are particularly tense. Try to relax the muscles in those areas. How will I feel when I quit smoking? Day 1 to 3 weeks Within the first 24 hours  of quitting smoking, you may start to feel withdrawal symptoms. These symptoms are usually most noticeable 2-3 days after quitting, but they usually do not last for more than 2-3 weeks. You may experience these symptoms: Mood swings. Restlessness, anxiety, or irritability. Trouble concentrating. Dizziness. Strong cravings for sugary foods and nicotine. Mild weight gain. Constipation. Nausea. Coughing or a sore throat. Changes in how the medicines that you take for unrelated issues work in your body. Depression. Trouble sleeping (insomnia). Week 3 and afterward After the first 2-3 weeks of quitting, you may start to notice more positive results, such as: Improved sense of smell and taste. Decreased coughing and sore throat. Slower heart rate. Lower blood pressure. Clearer skin. The ability to breathe more easily. Fewer sick days. Quitting smoking can be very challenging. Do not get discouraged if you are not successful the first time. Some people need to make many attempts to quit before they achieve long-term success. Do your best to stick to your quit plan, and talk with your health care provider if you haveany questions or concerns. Summary Smoking tobacco is the leading cause of preventable death. Quitting smoking is one of the best things that you can  do for your health. When you decide to quit smoking, create a plan to help you succeed. Quit smoking right away, not slowly over a period of time. When you start quitting, seek help from your health care provider, family, or friends. This information is not intended to replace advice given to you by your health care provider. Make sure you discuss any questions you have with your healthcare provider. Document Revised: 02/02/2019 Document Reviewed: 07/29/2018 Elsevier Patient Education  [place].

## 2020-11-19 NOTE — Progress Notes (Signed)
Subjective:   Erin House is a 70 y.o. female who presents for Medicare Annual (Subsequent) preventive examination.  Review of Systems    Review of Systems  Constitutional: Negative.   HENT: Negative.    Eyes: Negative.   Respiratory: Negative.    Cardiovascular: Negative.   Gastrointestinal: Negative.   Genitourinary: Negative.   Musculoskeletal: Negative.   Skin: Negative.   Neurological: Negative.   Endo/Heme/Allergies: Negative.   Psychiatric/Behavioral: Negative.     Cardiac Risk Factors include: advanced age (>58mn, >>50women);diabetes mellitus;obesity (BMI >30kg/m2);sedentary lifestyle     Objective:    Today's Vitals   11/19/20 1304 11/19/20 1315  BP: (!) 144/59   Pulse: 61   Resp: 18   Temp: 97.9 F (36.6 C)   SpO2: 99%   Weight: 220 lb (99.8 kg)   Height: 5' (1.524 m)   PainSc: 8  8    Body mass index is 42.97 kg/m.  Advanced Directives 11/19/2020 03/21/2017 01/20/2017 12/17/2016 10/15/2016 10/06/2016 06/29/2016  Does Patient Have a Medical Advance Directive? No No No No No No No  Would patient like information on creating a medical advance directive? No - Patient declined - - - - - -    Current Medications (verified) Outpatient Encounter Medications as of 11/19/2020  Medication Sig   celecoxib (CELEBREX) 100 MG capsule Take 1 capsule (100 mg total) by mouth 2 (two) times daily.   acetaminophen (TYLENOL) 500 MG tablet Take 1 tablet (500 mg total) by mouth every 6 (six) hours as needed.   Blood Glucose Monitoring Suppl (ACCU-CHEK AVIVA PLUS) w/Device KIT 1 each by Does not apply route daily.   cetirizine (ZYRTEC) 10 MG tablet Take 1 tablet (10 mg total) by mouth daily.   cyclobenzaprine (FLEXERIL) 5 MG tablet Take 1 tablet (5 mg total) by mouth at bedtime.   diclofenac Sodium (VOLTAREN) 1 % GEL APPLY 2 GRAMS TOPICALLY FOUR TIMES A DAY   Dulaglutide (TRULICITY) 1.5 MXB/2.8UXSOPN Inject 1.5 mg into the skin once a week.   EQ ASPIRIN ADULT LOW DOSE 81 MG  EC tablet Take 1 tablet by mouth once daily   ergocalciferol (VITAMIN D2) 1.25 MG (50000 UT) capsule Take 1 capsule (50,000 Units total) by mouth once a week.   furosemide (LASIX) 40 MG tablet Take 1 tablet (40 mg total) by mouth daily.   gabapentin (NEURONTIN) 100 MG capsule Take 1 capsule (100 mg total) by mouth at bedtime.   glucose blood (ACCU-CHEK AVIVA) test strip Will check fasting blood sugar daily. Use as instructed   HYDROcodone-acetaminophen (NORCO/VICODIN) 5-325 MG tablet Take 1-2 tablets by mouth every 6 (six) hours as needed for moderate pain.   lisinopril (ZESTRIL) 20 MG tablet TAKE 1 TABLET BY MOUTH ONCE DAILY . APPOINTMENT REQUIRED FOR FUTURE REFILLS   meloxicam (MOBIC) 15 MG tablet Take 1 tablet (15 mg total) by mouth daily.   metFORMIN (GLUCOPHAGE) 1000 MG tablet TAKE 1 TABLET BY MOUTH TWICE DAILY WITH MEALS   methocarbamol (ROBAXIN) 750 MG tablet Take 1 tablet (750 mg total) by mouth 4 (four) times daily.   NONFORMULARY OR COMPOUNDED ITEM Baclofen 2%, cyclobenzaprine% diclofenac 3% and lidocaine2%   omega-3 acid ethyl esters (LOVAZA) 1 g capsule Take 1 capsule (1 g total) by mouth 2 (two) times daily.   potassium chloride (K-DUR) 10 MEQ tablet Take 1 tablet (10 mEq total) by mouth daily.   rosuvastatin (CRESTOR) 20 MG tablet Take 1 tablet (20 mg total) by mouth daily.  traMADol (ULTRAM) 50 MG tablet Take 1 tablet (50 mg total) by mouth every 6 (six) hours as needed.   No facility-administered encounter medications on file as of 11/19/2020.    Allergies (verified) Patient has no known allergies.   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  Right  saphenofemoral junction and right greater saphenous vein incompetence.   Past Surgical History:  Procedure Laterality Date   BREAST BIOPSY Right 04/29/2014   BREAST EXCISIONAL BIOPSY Right    BREAST LUMPECTOMY WITH RADIOACTIVE SEED LOCALIZATION Right 06/11/2014   Procedure: BREAST LUMPECTOMY WITH RADIOACTIVE SEED LOCALIZATION;  Surgeon: Jackolyn Confer, MD;  Location: [facility];  Service: General;  Laterality: Right;   CHOLECYSTECTOMY     CYST ON WRIST     LEFT WRIST-BENIGN   MASTECTOMY Left 05/1999   ROTATOR CUFF REPAIR     LEFT SHOULDER   WISDOM TOOTH EXTRACTION     No family history on file. Social History   Socioeconomic History   Marital status: Married    Spouse name: Not on file   Number of children: Not on file   Years of education: Not on file   Highest education level: Not on file  Occupational History   Not on file  Tobacco Use   Smoking status: Former    Pack years: 0.00    Types: Cigarettes    Quit date: 05/24/1993    Years since quitting: 27.5   Smokeless tobacco: Never  Vaping Use   Vaping Use: Never used  Substance and Sexual Activity   Alcohol use: Yes    Alcohol/week: 0.0 standard drinks    Comment: occasionlly   Drug use: No   Sexual activity: Not Currently  Other Topics Concern   Not on file  Social History Narrative   Not on file   Social Determinants of Health   Financial Resource Strain: Low Risk    Difficulty of Paying Living Expenses: Not hard at all  Food Insecurity: No Food Insecurity   Worried About Charity fundraiser in the Last Year: Never true   [place] in the Last Year: Never true  Transportation Needs: No Transportation Needs   Lack of Transportation (Medical): No   Lack of Transportation (Non-Medical): No  Physical Activity: Inactive   Days of Exercise per Week: 0 days   Minutes of Exercise per Session: 0 min  Stress: Stress Concern Present   Feeling of Stress : Rather much  Social Connections:  Socially Integrated   Frequency of Communication with Friends and Family: More than three times a week   Frequency of Social Gatherings with Friends and Family: More than three times a week   Attends Religious Services: More than 4 times per year   Active Member of [employer] or Organizations: Yes   Attends Archivist Meetings: 1 to 4 times per year   Marital Status: Married    Tobacco Counseling Counseling given: Yes   Clinical Intake:  How often do you need to have someone help you when you read instructions, pamphlets, or other written materials from your doctor or  pharmacy?: 1 - Never What is the last grade level you completed in school?: college - 4 year degree  Diabetic? yes  Interpreter Needed?: No      Activities of Daily Living In your present state of health, do you have any difficulty performing the following activities: 11/19/2020  Hearing? N  Vision? N  Difficulty concentrating or making decisions? N  Walking or climbing stairs? Y  Dressing or bathing? N  Doing errands, shopping? N  Preparing Food and eating ? N  Using the Toilet? N  In the past six months, have you accidently leaked urine? N  Do you have problems with loss of bowel control? N  Managing your Medications? N  Managing your Finances? N  Housekeeping or managing your Housekeeping? N  Some recent data might be hidden    Patient Care Team: Dorena Dew, FNP as PCP - General (Family Medicine)  Indicate any recent Medical Services you may have received from other than [facility] providers in the past year (date may be approximate).     Assessment:   This is a routine wellness examination for Denissa.   Dietary issues and exercise activities discussed: Current Exercise Habits: The patient does not participate in regular exercise at present, Exercise limited by: orthopedic condition(s)   Goals Addressed             This Visit's Progress    DIET - REDUCE FAT INTAKE          Depression Screen PHQ 2/9 Scores 11/19/2020 09/11/2019 08/07/2019 05/08/2019 12/27/2018 07/05/2018 04/03/2018  PHQ - 2 Score 0 0 0 0 0 0 0    Fall Risk Fall Risk  11/19/2020 09/11/2019 08/07/2019 05/08/2019 12/27/2018  Falls in the past year? 0 1 0 1 1  Number falls in past yr: 0 1 - 0 0  Injury with Fall? 0 0 - 1 1  Risk for fall due to : No Fall Risks - - - -  Follow up Education provided - - - -    [place]:  Any stairs in or around the home? Yes  If so, are there any without handrails? No  Home free of loose throw rugs in walkways, pet beds, electrical cords, etc? Yes  Adequate lighting in your home to reduce risk of falls? Yes   ASSISTIVE DEVICES UTILIZED TO PREVENT FALLS:  Life alert? No  Use of a cane, walker or w/c? No  Grab bars in the bathroom? No  Shower chair or bench in shower? No  Elevated toilet seat or a handicapped toilet? No   TIMED UP AND GO:  Was the test performed? No .  Length of time to ambulate 10 feet: 3 sec.   Gait steady and fast without use of assistive device  Cognitive Function: MMSE - Mini Mental State Exam 11/19/2020  Orientation to time 5  Orientation to Place 5  Registration 3  Attention/ Calculation 5  Recall 3  Language- name 2 objects 2  Language- repeat 1  Language- follow 3 step command 3  Language- read & follow direction 1  Write a sentence 1  Copy design 1  Total score 30        Immunizations Immunization History  Administered Date(s) Administered   Influenza,inj,Quad PF,6+ Mos 02/18/2014, 01/30/2015, 02/20/2016   Influenza-Unspecified 03/04/2018   PFIZER(Purple Top)SARS-COV-2 Vaccination 06/18/2019, 07/09/2019   Pneumococcal Conjugate-13 08/15/2015   Pneumococcal Polysaccharide-23 04/11/2014   Tdap 07/15/2014   Zoster Recombinat (Shingrix) 05/16/2019  Zoster, Live 04/11/2014    TDAP status: Up to date  Flu Vaccine status: Up to date  Pneumococcal vaccine status: Up to  date  Covid-19 vaccine status: Completed vaccines  Qualifies for Shingles Vaccine? No   Zostavax completed No   Shingrix Completed?: No.    Education has been provided regarding the importance of this vaccine. Patient has been advised to call insurance company to determine out of pocket expense if they have not yet received this vaccine. Advised may also receive vaccine at local pharmacy or Health Dept. Verbalized acceptance and understanding.  Screening Tests Health Maintenance  Topic Date Due   OPHTHALMOLOGY EXAM  12/03/2018   FOOT EXAM  07/06/2019   Zoster Vaccines- Shingrix (2 of 2) 07/11/2019   HEMOGLOBIN A1C  09/12/2020   COVID-19 Vaccine (4 - Booster for Pfizer series) 12/26/2020   MAMMOGRAM  11/06/2021   COLONOSCOPY (Pts 45-1yr Insurance coverage will need to be confirmed)  04/30/2024   TETANUS/TDAP  07/15/2024   DEXA SCAN  Completed   Hepatitis C Screening  Completed   HPV VACCINES  Aged Out    Health Maintenance  Health Maintenance Due  Topic Date Due   OPHTHALMOLOGY EXAM  12/03/2018   FOOT EXAM  07/06/2019   Zoster Vaccines- Shingrix (2 of 2) 07/11/2019   HEMOGLOBIN A1C  09/12/2020    Colorectal cancer screening: Type of screening: Colonoscopy. Completed 2015. Repeat every 10 years  Mammogram status: Completed 2021. Repeat every year  Bone Density status: Completed  . Results reflect: Bone density results: NORMAL. Repeat every 5 years.  Lung Cancer Screening: (Low Dose CT Chest recommended if Age 72-80years, 30 pack-year currently smoking OR have quit w/in 15years.) does not qualify.   Lung Cancer Screening Referral: NA  Additional Screening:  Hepatitis C Screening: does qualify; Completed   Vision Screening: Recommended annual ophthalmology exams for early detection of glaucoma and other disorders of the eye. Is the patient up to date with their annual eye exam?  No  Who is the provider or what is the name of the office in which the patient attends  annual [place] exams[place] is not established with a provider, would they like to be referred to a provider to establish care?  NA .   Dental Screening: Recommended annual dental exams for proper oral hygiene  Community Resource Referral / Chronic Care Management: CRR required this visit?  No   CCM required this visit?  No      Plan:     I have personally reviewed and noted the following in the patient's chart:   Medical and social history Use of alcohol, tobacco or illicit drugs  Current medications and supplements including opioid prescriptions.  Functional ability and status Nutritional status Physical activity Advanced directives List of other physicians Hospitalizations, surgeries, and ER visits in previous 12 months Vitals Screenings to include cognitive, depression, and falls Referrals and appointments  In addition, I have reviewed and discussed with patient certain preventive protocols, quality metrics, and best practice recommendations. A written personalized care plan for preventive services as well as general preventive health recommendations were provided to patient.     TFenton Foy NP   11/19/2020

## 2020-11-20 ENCOUNTER — Ambulatory Visit: Payer: Medicare PPO | Attending: Family Medicine | Admitting: Family Medicine

## 2020-11-20 ENCOUNTER — Other Ambulatory Visit: Payer: Self-pay

## 2020-11-20 ENCOUNTER — Encounter: Payer: Self-pay | Admitting: Family Medicine

## 2020-11-20 ENCOUNTER — Ambulatory Visit
Admission: RE | Admit: 2020-11-20 | Discharge: 2020-11-20 | Disposition: A | Discharge status: Home / Self Care | Payer: Medicare PPO | Source: Ambulatory Visit | Attending: Family Medicine | Admitting: Family Medicine

## 2020-11-20 VITALS — BP 159/78 | HR 61 | Ht 60.0 in | Wt 218.8 lb

## 2020-11-20 DIAGNOSIS — E1159 Type 2 diabetes mellitus with other circulatory complications: Secondary | ICD-10-CM

## 2020-11-20 DIAGNOSIS — E1169 Type 2 diabetes mellitus with other specified complication: Secondary | ICD-10-CM | POA: Diagnosis not present

## 2020-11-20 DIAGNOSIS — E785 Hyperlipidemia, unspecified: Secondary | ICD-10-CM

## 2020-11-20 DIAGNOSIS — I6523 Occlusion and stenosis of bilateral carotid arteries: Secondary | ICD-10-CM | POA: Diagnosis not present

## 2020-11-20 DIAGNOSIS — I152 Hypertension secondary to endocrine disorders: Secondary | ICD-10-CM

## 2020-11-20 DIAGNOSIS — Z1231 Encounter for screening mammogram for malignant neoplasm of breast: Secondary | ICD-10-CM

## 2020-11-20 DIAGNOSIS — E119 Type 2 diabetes mellitus without complications: Secondary | ICD-10-CM | POA: Diagnosis not present

## 2020-11-20 DIAGNOSIS — R600 Localized edema: Secondary | ICD-10-CM

## 2020-11-20 LAB — POCT GLYCOSYLATED HEMOGLOBIN (HGB A1C): HbA1c, POC (controlled diabetic range): 6.4 % (ref 0.0–7.0)

## 2020-11-20 LAB — GLUCOSE, POCT (MANUAL RESULT ENTRY): POC Glucose: 122 mg/dl — AB (ref 70–99)

## 2020-11-20 MED ORDER — LISINOPRIL-HYDROCHLOROTHIAZIDE 20-25 MG PO TABS: 1.0000 | ORAL_TABLET | Freq: Every day | ORAL | 1 refills | Status: DC

## 2020-11-20 MED ORDER — LISINOPRIL-HYDROCHLOROTHIAZIDE 20-25 MG PO TABS: 1.0000 | ORAL_TABLET | Freq: Every day | ORAL | 1 refills | Status: AC

## 2020-11-20 MED ORDER — OMEGA-3-ACID ETHYL ESTERS 1 G PO CAPS: 1.0000 g | ORAL_CAPSULE | Freq: Two times a day (BID) | ORAL | 1 refills | Status: AC

## 2020-11-20 NOTE — Progress Notes (Signed)
Subjective:  Patient ID: Erin House, female    DOB: 10-18-1950  Age: 70 y.o. MRN: [id number]  CC: New Patient (Initial Visit)   HPI Erin House is a 70 year old female with a PMH significant for Type 2 DM (A1c 6.4), Hypertension, Hyperlipidemia, Obesity, PVD, Breast Ca ( s/p b/l mastectomy in 2000) here to establish care.  Interval History: She is stressed over caring for her Mom who is currently under Hospice in [place] and she will be travelling back in 4 days. Her blood pressure is elevated and she has been compliant with her medications. She takes Furosemide 3 times/week prn pedal edema due to its diuretic effect. She has no chest pain or dyspnea.  Last eye exam was in 2019. She is doing well on her Diabetic medications and denies presence of visual symptoms or hypoglycemia.Seen by Podiatry yesterday. Denies presence of intermittent claudication but does have left ankle pain for which Podiatry had provided her with a brace. She is due for an annual eye exam. She has no additional concerns today.  Past Medical History:  Diagnosis Date   Arthritis    knees   Cancer ([place]) 2000   left breast   Diabetes mellitus without complication ([place])    Hyperlipemia    Hypertension    Sleep apnea    has CPAP but does not wear every night   Venous insufficiency of both lower extremities 12/2016   Bilateral femoral and small saphenous vein incompetence.  Right saphenofemoral junction and right greater saphenous vein incompetence.    Past Surgical History:  Procedure Laterality Date   BREAST BIOPSY Right 04/29/2014   BREAST EXCISIONAL BIOPSY Right    BREAST LUMPECTOMY WITH RADIOACTIVE SEED LOCALIZATION Right 06/11/2014   Procedure: BREAST LUMPECTOMY WITH RADIOACTIVE SEED LOCALIZATION;  Surgeon: Jackolyn Confer, MD;  Location: [facility];  Service: General;  Laterality: Right;   CHOLECYSTECTOMY     CYST ON WRIST     LEFT WRIST-BENIGN   MASTECTOMY Left 05/1999    ROTATOR CUFF REPAIR     LEFT SHOULDER   WISDOM TOOTH EXTRACTION      No family history on file.  No Known Allergies  Outpatient Medications Prior to Visit  Medication Sig Dispense Refill   acetaminophen (TYLENOL) 500 MG tablet Take 1 tablet (500 mg total) by mouth every 6 (six) hours as needed. 30 tablet 0   Blood Glucose Monitoring Suppl (ACCU-CHEK AVIVA PLUS) w/Device KIT 1 each by Does not apply route daily. 1 kit 0   celecoxib (CELEBREX) 100 MG capsule Take 1 capsule (100 mg total) by mouth 2 (two) times daily. 180 capsule 1   cetirizine (ZYRTEC) 10 MG tablet Take 1 tablet (10 mg total) by mouth daily. 90 tablet 3   cyclobenzaprine (FLEXERIL) 5 MG tablet Take 1 tablet (5 mg total) by mouth at bedtime. 30 tablet 1   diclofenac Sodium (VOLTAREN) 1 % GEL APPLY 2 GRAMS TOPICALLY FOUR TIMES A DAY 350 g 0   Dulaglutide (TRULICITY) 1.5 GQ/6.7YP SOPN Inject 1.5 mg into the skin once a week. 6 mL 3   EQ ASPIRIN ADULT LOW DOSE 81 MG EC tablet Take 1 tablet by mouth once daily 90 tablet 0   ergocalciferol (VITAMIN D2) 1.25 MG (50000 UT) capsule Take 1 capsule (50,000 Units total) by mouth once a week. 12 capsule 3   furosemide (LASIX) 40 MG tablet Take 1 tablet (40 mg total) by mouth daily. 90 tablet 3   gabapentin (  NEURONTIN) 100 MG capsule Take 1 capsule (100 mg total) by mouth at bedtime. 30 capsule 0   glucose blood (ACCU-CHEK AVIVA) test strip Will check fasting blood sugar daily. Use as instructed 100 each 12   HYDROcodone-acetaminophen (NORCO/VICODIN) 5-325 MG tablet Take 1-2 tablets by mouth every 6 (six) hours as needed for moderate pain. 30 tablet 0   meloxicam (MOBIC) 15 MG tablet Take 1 tablet (15 mg total) by mouth daily. 90 tablet 3   metFORMIN (GLUCOPHAGE) 1000 MG tablet TAKE 1 TABLET BY MOUTH TWICE DAILY WITH MEALS 180 tablet 0   methocarbamol (ROBAXIN) 750 MG tablet Take 1 tablet (750 mg total) by mouth 4 (four) times daily. 90 tablet 0   NONFORMULARY OR COMPOUNDED ITEM Baclofen  2%, cyclobenzaprine% diclofenac 3% and lidocaine2%     potassium chloride (K-DUR) 10 MEQ tablet Take 1 tablet (10 mEq total) by mouth daily. 90 tablet 1   rosuvastatin (CRESTOR) 20 MG tablet Take 1 tablet (20 mg total) by mouth daily. 90 tablet 3   traMADol (ULTRAM) 50 MG tablet Take 1 tablet (50 mg total) by mouth every 6 (six) hours as needed. 30 tablet 0   lisinopril (ZESTRIL) 20 MG tablet TAKE 1 TABLET BY MOUTH ONCE DAILY . APPOINTMENT REQUIRED FOR FUTURE REFILLS 90 tablet 3   omega-3 acid ethyl esters (LOVAZA) 1 g capsule Take 1 capsule (1 g total) by mouth 2 (two) times daily. 180 capsule 3   No facility-administered medications prior to visit.     ROS Review of Systems  Constitutional:  Negative for activity change, appetite change and fatigue.  HENT:  Negative for congestion, sinus pressure and sore throat.   Eyes:  Negative for visual disturbance.  Respiratory:  Negative for cough, chest tightness, shortness of breath and wheezing.   Cardiovascular:  Positive for leg swelling. Negative for chest pain and palpitations.  Gastrointestinal:  Negative for abdominal distention, abdominal pain and constipation.  Endocrine: Negative for polydipsia.  Genitourinary:  Negative for dysuria and frequency.  Musculoskeletal:  Negative for arthralgias and back pain.  Skin:  Negative for rash.  Neurological:  Negative for tremors, light-headedness and numbness.  Hematological:  Does not bruise/bleed easily.  Psychiatric/Behavioral:  Negative for agitation and behavioral problems.    Objective:  BP (!) 159/78   Pulse 61   Ht 5' (1.524 m)   Wt 218 lb 12.8 oz (99.2 kg)   SpO2 100%   BMI 42.73 kg/m   BP/Weight 11/20/2020 11/19/2020 62/56/3893  Systolic BP 734 287 -  Diastolic BP 78 59 -  Wt. (Lbs) 218.8 220 217.6  BMI 42.73 42.97 42.5      Physical Exam Constitutional:      Appearance: She is well-developed. She is obese.  Neck:     Vascular: No JVD.  Cardiovascular:     Rate and  Rhythm: Normal rate.     Heart sounds: Normal heart sounds. No murmur heard. Pulmonary:     Effort: Pulmonary effort is normal.     Breath sounds: Normal breath sounds. No wheezing or rales.  Chest:     Chest wall: No tenderness.  Abdominal:     General: Bowel sounds are normal. There is no distension.     Palpations: Abdomen is soft. There is no mass.     Tenderness: There is no abdominal tenderness.  Musculoskeletal:        General: Normal range of motion.     Right lower leg: Edema present.  Left lower leg: Edema present.  Neurological:     Mental Status: She is alert and oriented to person, place, and time.  Psychiatric:        Mood and Affect: Mood normal.    Diabetic Foot Exam - Simple   Simple Foot Form Diabetic Foot exam was performed with the following findings: Yes 11/20/2020 10:04 AM  Visual Inspection No deformities, no ulcerations, no other skin breakdown bilaterally: Yes Sensation Testing Intact to touch and monofilament testing bilaterally: Yes Pulse Check Posterior Tibialis and Dorsalis pulse intact bilaterally: Yes Comments Nonpitting bilateral ankle edema     CMP Latest Ref Rng & Units 03/14/2020 07/31/2019 05/08/2019  Glucose 65 - 99 mg/dL 126(H) 112(H) 108(H)  BUN 8 - 27 mg/dL _0 Creatinine 0.57 - 1.00 mg/dL 0.60 0.64 0.64  Sodium 134 - 144 mmol/L 144 142 142  Potassium 3.5 - 5.2 mmol/L 5.0 4.0 3.8  Chloride 96 - 106 mmol/L 106 104 100  CO2 20 - 29 mmol/L _1 Calcium 8.7 - 10.3 mg/dL 9.5 9.5 10.0  Total Protein 6.0 - 8.5 g/dL 7.0 - -  Total Bilirubin 0.0 - 1.2 mg/dL 0.6 - -  Alkaline Phos 44 - 121 IU/L 85 - -  AST 0 - 40 IU/L 25 - -  ALT 0 - 32 IU/L 37(H) - -    Lipid Panel     Component Value Date/Time   CHOL 142 10/13/2018 0904   TRIG 134 10/13/2018 0904   HDL 53 10/13/2018 0904   CHOLHDL 2.7 10/13/2018 0904   CHOLHDL 3.3 01/14/2017 0915   VLDL 34 (H) 01/14/2017 0915   LDLCALC 62 10/13/2018 0904    CBC    Component  Value Date/Time   WBC 5.5 06/29/2016 1057   RBC 4.82 06/29/2016 1057   HGB 11.4 (L) 06/29/2016 1057   HCT 37.6 06/29/2016 1057   PLT 170 06/29/2016 1057   MCV 78.0 (L) 06/29/2016 1057   MCH 23.7 (L) 06/29/2016 1057   MCHC 30.3 (L) 06/29/2016 1057   RDW 16.3 (H) 06/29/2016 1057   LYMPHSABS 2,200 06/29/2016 1057   MONOABS 330 06/29/2016 1057   EOSABS 110 06/29/2016 1057   BASOSABS 0 06/29/2016 1057    Lab Results  Component Value Date   HGBA1C 6.4 11/20/2020    Assessment & Plan:  1. Type 2 diabetes mellitus without complication, without long-term current use of insulin (HCC) Controlled with A1c of 6.4 Continue current regimen Counseled on Diabetic diet, my plate method, 643 minutes of moderate intensity exercise/week Blood sugar logs with fasting goals of 80-120 mg/dl, random of less than 180 and in the event of sugars less than 60 mg/dl or greater than 400 mg/dl encouraged to notify the clinic. Advised on the need for annual eye exams, annual foot exams, Pneumonia vaccine.  - POCT glucose (manual entry) - POCT glycosylated hemoglobin (Hb A1C) - Microalbumin / creatinine urine ratio - CMP14+EGFR - Lipid panel  2. Carotid stenosis, asymptomatic, bilateral Stable Risk factor modification - omega-3 acid ethyl esters (LOVAZA) 1 g capsule; Take 1 capsule (1 g total) by mouth 2 (two) times daily.  Dispense: 180 capsule; Refill: 1  3. Hypertension associated with diabetes ([place]) Uncontrolled Lisinopril substituted with Lisinopril/HCTZ Counseled on blood pressure goal of less than 130/80, low-sodium, DASH diet, medication compliance, 150 minutes of moderate intensity exercise per week. Discussed medication compliance, adverse effects.   4. Hyperlipidemia associated with type 2 diabetes mellitus (HCC) Controlled Continue Statin, low  cholesterol diet - omega-3 acid ethyl esters (LOVAZA) 1 g capsule; Take 1 capsule (1 g total) by mouth 2 (two) times daily.  Dispense: 180 capsule;  Refill: 1  6. Pedal edema Advised to elevate feet Consistent use of diuretic will be beneficial Low sodium diet, compression stockings   Meds ordered this encounter  Medications   DISCONTD: lisinopril-hydrochlorothiazide (ZESTORETIC) 20-25 MG tablet    Sig: Take 1 tablet by mouth daily.    Dispense:  90 tablet    Refill:  1   omega-3 acid ethyl esters (LOVAZA) 1 g capsule    Sig: Take 1 capsule (1 g total) by mouth 2 (two) times daily.    Dispense:  180 capsule    Refill:  1   lisinopril-hydrochlorothiazide (ZESTORETIC) 20-25 MG tablet    Sig: Take 1 tablet by mouth daily.    Dispense:  90 tablet    Refill:  1    Discontinue Lisinopril    Follow-up: Return for Follow-up of medical conditions, patient will call for an appointment when she is in town.Charlott Rakes, MD, FAAFP. [facility] and [place], [place]   11/20/2020, 4:37 PM

## 2020-11-20 NOTE — Patient Instructions (Signed)
Edema  Edema is when you have too much fluid in your body or under your skin. Edema may make your legs, feet, and ankles swell up. Swelling is also common in looser tissues, like around your eyes. This is a common condition. It gets more common as you get older. There are many possible causes of edema. Eating too much salt (sodium) and being on your feet or sitting for a long time can cause edema in yourlegs, feet, and ankles. Hot weather may make edema worse. Edema is usually painless. Your skin may look swollen or shiny. Follow these instructions at home: Keep the swollen body part raised (elevated) above the level of your heart when you are sitting or lying down. Do not sit still or stand for a long time. Do not wear tight clothes. Do not wear garters on your upper legs. Exercise your legs. This can help the swelling go down. Wear elastic bandages or support stockings as told by your doctor. Eat a low-salt (low-sodium) diet to reduce fluid as told by your doctor. Depending on the cause of your swelling, you may need to limit how much fluid you drink (fluid restriction). Take over-the-counter and prescription medicines only as told by your doctor. Contact a doctor if: Treatment is not working. You have heart, liver, or kidney disease and have symptoms of edema. You have sudden and unexplained weight gain. Get help right away if: You have shortness of breath or chest pain. You cannot breathe when you lie down. You have pain, redness, or warmth in the swollen areas. You have heart, liver, or kidney disease and get edema all of a sudden. You have a fever and your symptoms get worse all of a sudden. Summary Edema is when you have too much fluid in your body or under your skin. Edema may make your legs, feet, and ankles swell up. Swelling is also common in looser tissues, like around your eyes. Raise (elevate) the swollen body part above the level of your heart when you are sitting or lying  down. Follow your doctor's instructions about diet and how much fluid you can drink (fluid restriction). This information is not intended to replace advice given to you by your health care provider. Make sure you discuss any questions you have with your healthcare provider. Document Revised: 03/05/2020 Document Reviewed: 03/05/2020 Elsevier Patient Education  2022 Elsevier Inc.  

## 2020-11-20 NOTE — Progress Notes (Signed)
Needs refill on omega 3

## 2020-11-21 LAB — LIPID PANEL
Chol/HDL Ratio: 2.7 ratio (ref 0.0–4.4)
Cholesterol, Total: 154 mg/dL (ref 100–199)
HDL: 58 mg/dL (ref >39)
LDL Chol Calc (NIH): 74 mg/dL (ref 0–99)
Triglycerides: 123 mg/dL (ref 0–149)
VLDL Cholesterol Cal: 22 mg/dL (ref 5–40)

## 2020-11-21 LAB — MICROALBUMIN / CREATININE URINE RATIO
Creatinine, Urine: 219 mg/dL
Microalb/Creat Ratio: 13 mg/g creat (ref 0–29)
Microalbumin, Urine: 29.1 ug/mL

## 2020-11-21 LAB — CMP14+EGFR
ALT: 18 IU/L (ref 0–32)
AST: 18 IU/L (ref 0–40)
Albumin/Globulin Ratio: 2.1 (ref 1.2–2.2)
Albumin: 4.6 g/dL (ref 3.7–4.7)
Alkaline Phosphatase: 74 IU/L (ref 44–121)
BUN/Creatinine Ratio: 22 (ref 12–28)
BUN: 15 mg/dL (ref 8–27)
Bilirubin Total: 0.7 mg/dL (ref 0.0–1.2)
CO2: 25 mmol/L (ref 20–29)
Calcium: 9.7 mg/dL (ref 8.7–10.3)
Chloride: 104 mmol/L (ref 96–106)
Creatinine, Ser: 0.67 mg/dL (ref 0.57–1.00)
Globulin, Total: 2.2 g/dL (ref 1.5–4.5)
Glucose: 109 mg/dL — ABNORMAL HIGH (ref 65–99)
Potassium: 3.9 mmol/L (ref 3.5–5.2)
Sodium: 143 mmol/L (ref 134–144)
Total Protein: 6.8 g/dL (ref 6.0–8.5)
eGFR: 93 mL/min/{1.73_m2} (ref >59)

## 2020-11-25 ENCOUNTER — Ambulatory Visit: Payer: Medicare PPO | Admitting: Family Medicine

## 2021-02-06 ENCOUNTER — Other Ambulatory Visit (HOSPITAL_COMMUNITY): Payer: Self-pay

## 2021-03-18 ENCOUNTER — Encounter: Payer: Self-pay | Admitting: Family Medicine

## 2021-03-18 ENCOUNTER — Ambulatory Visit: Payer: Medicare PPO | Attending: Family Medicine | Admitting: Family Medicine

## 2021-03-18 ENCOUNTER — Other Ambulatory Visit: Payer: Self-pay

## 2021-03-18 ENCOUNTER — Ambulatory Visit (HOSPITAL_COMMUNITY)
Admission: RE | Admit: 2021-03-18 | Discharge: 2021-03-18 | Disposition: A | Discharge status: Home / Self Care | Payer: Medicare PPO | Source: Ambulatory Visit | Attending: Family Medicine | Admitting: Family Medicine

## 2021-03-18 VITALS — BP 170/96 | HR 64 | Ht 60.0 in | Wt 227.0 lb

## 2021-03-18 DIAGNOSIS — M25572 Pain in left ankle and joints of left foot: Secondary | ICD-10-CM

## 2021-03-18 DIAGNOSIS — E1159 Type 2 diabetes mellitus with other circulatory complications: Secondary | ICD-10-CM | POA: Diagnosis not present

## 2021-03-18 DIAGNOSIS — G8929 Other chronic pain: Secondary | ICD-10-CM

## 2021-03-18 DIAGNOSIS — E1169 Type 2 diabetes mellitus with other specified complication: Secondary | ICD-10-CM | POA: Diagnosis not present

## 2021-03-18 DIAGNOSIS — E785 Hyperlipidemia, unspecified: Secondary | ICD-10-CM

## 2021-03-18 DIAGNOSIS — E119 Type 2 diabetes mellitus without complications: Secondary | ICD-10-CM | POA: Diagnosis not present

## 2021-03-18 DIAGNOSIS — I152 Hypertension secondary to endocrine disorders: Secondary | ICD-10-CM

## 2021-03-18 MED ORDER — POTASSIUM CHLORIDE ER 10 MEQ PO TBCR: 10.0000 meq | EXTENDED_RELEASE_TABLET | Freq: Every day | ORAL | 1 refills | Status: DC

## 2021-03-18 NOTE — Progress Notes (Signed)
Subjective:  Patient ID: Erin House, female    DOB: 10-31-1950  Age: 70 y.o. MRN: [id number]  CC: Diabetes   HPI Erin House is a 70 y.o. year old female with a history of Type 2 DM (A1c 6.4), Hypertension, Hyperlipidemia, Obesity, PVD, Breast Ca ( s/p b/l mastectomy in 2000)  Interval History: Her husband passed in 11/2020.  Mother passed in 12/2020.  She is currently seeing a therapist.  She shuttles between [place] and [place] and has 2 children in [place] to provide support for her. Her BP is elevated and she is yet to take hr antihypertensive as is fasting.  The medial aspect of her left ankle up to her heel hurts. She received a foot brace from the foot Doctor which has not really been helpful.  Pain has been present for years and she would like to get an x-ray done.  Endorses presence of slight left ankle swelling but right ankle is normal.  Endorses compliance with her antihypertensive, her statin and denies planes of hypoglycemia. Past Medical History:  Diagnosis Date   Arthritis    knees   Cancer ([place]) 2000   left breast   Diabetes mellitus without complication ([place])    Hyperlipemia    Hypertension    Sleep apnea    has CPAP but does not wear every night   Venous insufficiency of both lower extremities 12/2016   Bilateral femoral and small saphenous vein incompetence.  Right saphenofemoral junction and right greater saphenous vein incompetence.    Past Surgical History:  Procedure Laterality Date   BREAST BIOPSY Right 04/29/2014   BREAST EXCISIONAL BIOPSY Right    BREAST LUMPECTOMY WITH RADIOACTIVE SEED LOCALIZATION Right 06/11/2014   Procedure: BREAST LUMPECTOMY WITH RADIOACTIVE SEED LOCALIZATION;  Surgeon: Jackolyn Confer, MD;  Location: [facility];  Service: General;  Laterality: Right;   CHOLECYSTECTOMY     CYST ON WRIST     LEFT WRIST-BENIGN   MASTECTOMY Left 05/1999   ROTATOR CUFF REPAIR     LEFT SHOULDER   WISDOM TOOTH  EXTRACTION      History reviewed. No pertinent family history.  No Known Allergies  Outpatient Medications Prior to Visit  Medication Sig Dispense Refill   acetaminophen (TYLENOL) 500 MG tablet Take 1 tablet (500 mg total) by mouth every 6 (six) hours as needed. 30 tablet 0   Blood Glucose Monitoring Suppl (ACCU-CHEK AVIVA PLUS) w/Device KIT 1 each by Does not apply route daily. 1 kit 0   celecoxib (CELEBREX) 100 MG capsule Take 1 capsule (100 mg total) by mouth 2 (two) times daily. 180 capsule 1   cetirizine (ZYRTEC) 10 MG tablet Take 1 tablet (10 mg total) by mouth daily. 90 tablet 3   cyclobenzaprine (FLEXERIL) 5 MG tablet Take 1 tablet (5 mg total) by mouth at bedtime. 30 tablet 1   Dulaglutide (TRULICITY) 1.5 WG/6.6ZL SOPN Inject 1.5 mg into the skin once a week. 6 mL 3   EQ ASPIRIN ADULT LOW DOSE 81 MG EC tablet Take 1 tablet by mouth once daily 90 tablet 0   ergocalciferol (VITAMIN D2) 1.25 MG (50000 UT) capsule Take 1 capsule (50,000 Units total) by mouth once a week. 12 capsule 3   furosemide (LASIX) 40 MG tablet Take 1 tablet (40 mg total) by mouth daily. 90 tablet 3   glucose blood (ACCU-CHEK AVIVA) test strip Will check fasting blood sugar daily. Use as instructed 100 each 12   lisinopril-hydrochlorothiazide (ZESTORETIC) 20-25 MG  tablet Take 1 tablet by mouth daily. 90 tablet 1   meloxicam (MOBIC) 15 MG tablet Take 1 tablet (15 mg total) by mouth daily. 90 tablet 3   NONFORMULARY OR COMPOUNDED ITEM Baclofen 2%, cyclobenzaprine% diclofenac 3% and lidocaine2%     omega-3 acid ethyl esters (LOVAZA) 1 g capsule Take 1 capsule (1 g total) by mouth 2 (two) times daily. 180 capsule 1   rosuvastatin (CRESTOR) 20 MG tablet Take 1 tablet (20 mg total) by mouth daily. 90 tablet 3   potassium chloride (K-DUR) 10 MEQ tablet Take 1 tablet (10 mEq total) by mouth daily. 90 tablet 1   diclofenac Sodium (VOLTAREN) 1 % GEL APPLY 2 GRAMS TOPICALLY FOUR TIMES A DAY (Patient not taking: Reported on  03/18/2021) 350 g 0   gabapentin (NEURONTIN) 100 MG capsule Take 1 capsule (100 mg total) by mouth at bedtime. (Patient not taking: Reported on 03/18/2021) 30 capsule 0   HYDROcodone-acetaminophen (NORCO/VICODIN) 5-325 MG tablet Take 1-2 tablets by mouth every 6 (six) hours as needed for moderate pain. (Patient not taking: Reported on 03/18/2021) 30 tablet 0   metFORMIN (GLUCOPHAGE) 1000 MG tablet TAKE 1 TABLET BY MOUTH TWICE DAILY WITH MEALS (Patient not taking: Reported on 03/18/2021) 180 tablet 0   methocarbamol (ROBAXIN) 750 MG tablet Take 1 tablet (750 mg total) by mouth 4 (four) times daily. (Patient not taking: Reported on 03/18/2021) 90 tablet 0   traMADol (ULTRAM) 50 MG tablet Take 1 tablet (50 mg total) by mouth every 6 (six) hours as needed. (Patient not taking: Reported on 03/18/2021) 30 tablet 0   No facility-administered medications prior to visit.     ROS Review of Systems  Constitutional:  Negative for activity change, appetite change and fatigue.  HENT:  Negative for congestion, sinus pressure and sore throat.   Eyes:  Negative for visual disturbance.  Respiratory:  Negative for cough, chest tightness, shortness of breath and wheezing.   Cardiovascular:  Negative for chest pain and palpitations.  Gastrointestinal:  Negative for abdominal distention, abdominal pain and constipation.  Endocrine: Negative for polydipsia.  Genitourinary:  Negative for dysuria and frequency.  Musculoskeletal:        See HPI  Skin:  Negative for rash.  Neurological:  Negative for tremors, light-headedness and numbness.  Hematological:  Does not bruise/bleed easily.  Psychiatric/Behavioral:  Positive for dysphoric mood. Negative for agitation and behavioral problems.    Objective:  BP (!) 170/96   Pulse 64   Ht 5' (1.524 m)   Wt 227 lb (103 kg)   SpO2 100%   BMI 44.33 kg/m   BP/Weight 03/18/2021 11/20/2020 12/01/6267  Systolic BP 485 462 703  Diastolic BP 96 78 59  Wt. (Lbs) 227 218.8  220  BMI 44.33 42.73 42.97      Physical Exam Constitutional:      Appearance: She is well-developed. She is obese.  Cardiovascular:     Rate and Rhythm: Normal rate.     Heart sounds: Murmur heard.  Pulmonary:     Effort: Pulmonary effort is normal.     Breath sounds: Normal breath sounds. No wheezing or rales.  Chest:     Chest wall: No tenderness.  Abdominal:     General: Bowel sounds are normal. There is no distension.     Palpations: Abdomen is soft. There is no mass.     Tenderness: There is no abdominal tenderness.  Musculoskeletal:     Right lower leg: No edema.  Left lower leg: No edema.     Comments: Slight tenderness on palpation of left ankle posterior to medial malleolus Minimal tenderness on inversion and eversion of Lfoot Slight edema of medial aspect of left ankle around medial malleolus.  Neurological:     Mental Status: She is alert and oriented to person, place, and time.  Psychiatric:        Mood and Affect: Mood normal.    CMP Latest Ref Rng & Units 11/20/2020 03/14/2020 07/31/2019  Glucose 65 - 99 mg/dL 109(H) 126(H) 112(H)  BUN 8 - 27 mg/dL '15 22 13  ' Creatinine 0.57 - 1.00 mg/dL 0.67 0.60 0.64  Sodium 134 - 144 mmol/L 143 144 142  Potassium 3.5 - 5.2 mmol/L 3.9 5.0 4.0  Chloride 96 - 106 mmol/L 104 106 104  CO2 20 - 29 mmol/L '25 28 25  ' Calcium 8.7 - 10.3 mg/dL 9.7 9.5 9.5  Total Protein 6.0 - 8.5 g/dL 6.8 7.0 -  Total Bilirubin 0.0 - 1.2 mg/dL 0.7 0.6 -  Alkaline Phos 44 - 121 IU/L 74 85 -  AST 0 - 40 IU/L 18 25 -  ALT 0 - 32 IU/L 18 37(H) -    Lipid Panel     Component Value Date/Time   CHOL 154 11/20/2020 1006   TRIG 123 11/20/2020 1006   HDL 58 11/20/2020 1006   CHOLHDL 2.7 11/20/2020 1006   CHOLHDL 3.3 01/14/2017 0915   VLDL 34 (H) 01/14/2017 0915   LDLCALC 74 11/20/2020 1006    CBC    Component Value Date/Time   WBC 5.5 06/29/2016 1057   RBC 4.82 06/29/2016 1057   HGB 11.4 (L) 06/29/2016 1057   HCT 37.6 06/29/2016 1057    PLT 170 06/29/2016 1057   MCV 78.0 (L) 06/29/2016 1057   MCH 23.7 (L) 06/29/2016 1057   MCHC 30.3 (L) 06/29/2016 1057   RDW 16.3 (H) 06/29/2016 1057   LYMPHSABS 2,200 06/29/2016 1057   MONOABS 330 06/29/2016 1057   EOSABS 110 06/29/2016 1057   BASOSABS 0 06/29/2016 1057   Lab Results  Component Value Date   HGBA1C 6.4 11/20/2020      Assessment & Plan:  1. Type 2 diabetes mellitus without complication, without long-term current use of insulin (HCC) Controlled with A1c of 6.4 from 10/2020 Will send of A1c today Continue metformin Counseled on Diabetic diet, my plate method, 681 minutes of moderate intensity exercise/week Blood sugar logs with fasting goals of 80-120 mg/dl, random of less than 180 and in the event of sugars less than 60 mg/dl or greater than 400 mg/dl encouraged to notify the clinic. Advised on the need for annual eye exams, annual foot exams, Pneumonia vaccine. - POCT glucose (manual entry) - POCT glycosylated hemoglobin (Hb A1C) - CMP14+EGFR  2. Chronic pain of left ankle Chronic uncontrolled Continue with ankle brace per orthopedics Will work-up with imaging - DG Ankle Complete Left; Future  3. Hypertension associated with diabetes ([place]) Uncontrolled She is yet to take antihypertensives hence I will make no regimen change today and advised her to take her medication as soon as she gets home Counseled on blood pressure goal of less than 130/80, low-sodium, DASH diet, medication compliance, 150 minutes of moderate intensity exercise per week. Discussed medication compliance, adverse effects.  4. Hyperlipidemia associated with type 2 diabetes mellitus ([place]) Controlled from 10/2020 Continue statin - LP+Non-HDL Cholesterol   Health Care Maintenance: States she is up-to-date on her shingles vaccine and will get [place] the information  to update her chart. Meds ordered this encounter  Medications   potassium chloride (KLOR-CON) 10 MEQ tablet    Sig: Take 1  tablet (10 mEq total) by mouth daily.    Dispense:  90 tablet    Refill:  1    Follow-up: Return in about 6 months (around 09/16/2021) for Medical conditions.       Charlott Rakes, MD, FAAFP. [facility] and [facility], [place]   03/18/2021, 9:39 AM

## 2021-03-19 LAB — CMP14+EGFR
ALT: 26 IU/L (ref 0–32)
AST: 22 IU/L (ref 0–40)
Albumin/Globulin Ratio: 1.7 (ref 1.2–2.2)
Albumin: 4.4 g/dL (ref 3.7–4.7)
Alkaline Phosphatase: 76 IU/L (ref 44–121)
BUN/Creatinine Ratio: 19 (ref 12–28)
BUN: 13 mg/dL (ref 8–27)
Bilirubin Total: 0.5 mg/dL (ref 0.0–1.2)
CO2: 25 mmol/L (ref 20–29)
Calcium: 9.7 mg/dL (ref 8.7–10.3)
Chloride: 101 mmol/L (ref 96–106)
Creatinine, Ser: 0.69 mg/dL (ref 0.57–1.00)
Globulin, Total: 2.6 g/dL (ref 1.5–4.5)
Glucose: 125 mg/dL — ABNORMAL HIGH (ref 70–99)
Potassium: 3.8 mmol/L (ref 3.5–5.2)
Sodium: 143 mmol/L (ref 134–144)
Total Protein: 7 g/dL (ref 6.0–8.5)
eGFR: 92 mL/min/{1.73_m2} (ref >59)

## 2021-03-19 LAB — LP+NON-HDL CHOLESTEROL
Cholesterol, Total: 181 mg/dL (ref 100–199)
HDL: 64 mg/dL (ref >39)
LDL Chol Calc (NIH): 101 mg/dL — ABNORMAL HIGH (ref 0–99)
Total Non-HDL-Chol (LDL+VLDL): 117 mg/dL (ref 0–129)
Triglycerides: 88 mg/dL (ref 0–149)
VLDL Cholesterol Cal: 16 mg/dL (ref 5–40)

## 2021-04-27 DIAGNOSIS — F411 Generalized anxiety disorder: Secondary | ICD-10-CM | POA: Diagnosis not present

## 2021-05-29 DIAGNOSIS — F411 Generalized anxiety disorder: Secondary | ICD-10-CM | POA: Diagnosis not present

## 2021-06-22 ENCOUNTER — Ambulatory Visit: Payer: Medicare PPO | Admitting: Podiatry

## 2021-06-22 ENCOUNTER — Other Ambulatory Visit: Payer: Self-pay

## 2021-06-22 DIAGNOSIS — M79674 Pain in right toe(s): Secondary | ICD-10-CM

## 2021-06-22 DIAGNOSIS — M7752 Other enthesopathy of left foot: Secondary | ICD-10-CM | POA: Diagnosis not present

## 2021-06-22 DIAGNOSIS — M79675 Pain in left toe(s): Secondary | ICD-10-CM

## 2021-06-22 DIAGNOSIS — B351 Tinea unguium: Secondary | ICD-10-CM | POA: Diagnosis not present

## 2021-06-22 MED ORDER — BETAMETHASONE SOD PHOS & ACET 6 (3-3) MG/ML IJ SUSP
3.0000 mg | Freq: Once | INTRAMUSCULAR | Status: AC
  Administered 2021-06-22: 3 mg via INTRA_ARTICULAR

## 2021-06-22 NOTE — Progress Notes (Signed)
° °  SUBJECTIVE Patient with a history of diabetes mellitus presents to office today complaining of elongated, thickened nails that cause pain while ambulating in shoes.  Patient is unable to trim their own nails. Patient is here for further evaluation and treatment. Patient also states that she continues to have left ankle pain.  She denies a history of injury.  She has had this in the past and she states that an ankle brace has helped significantly.  She had x-rays taken in 03/18/2021 which were normal.  She currently has not done anything for treatment.  She currently also does not have an ankle brace   Past Medical History:  Diagnosis Date   Arthritis    knees   Cancer ([place]) 2000   left breast   Diabetes mellitus without complication ([place])    Hyperlipemia    Hypertension    Sleep apnea    has CPAP but does not wear every night   Venous insufficiency of both lower extremities 12/2016   Bilateral femoral and small saphenous vein incompetence.  Right saphenofemoral junction and right greater saphenous vein incompetence.    OBJECTIVE General Patient is awake, alert, and oriented x 3 and in no acute distress. Derm Skin is dry and supple bilateral. Negative open lesions or macerations. Remaining integument unremarkable. Nails are tender, long, thickened and dystrophic with subungual debris, consistent with onychomycosis, 1-5 bilateral. No signs of infection noted. Vasc  DP and PT pedal pulses palpable bilaterally. Temperature gradient within normal limits.  Neuro Epicritic and protective threshold sensation diminished bilaterally.  Musculoskeletal Exam No symptomatic pedal deformities noted bilateral. Muscular strength within normal limits.  There is some pain on palpation to the medial and lateral aspect of the left ankle joint.  Slight pain with range of motion as well Radiographic exam LT ankle 03/18/2021: FINDINGS: There is no acute fracture or dislocation. The bones are osteopenic. The  ankle mortise is intact. The soft tissues are grossly unremarkable.   IMPRESSION: Negative.  ASSESSMENT 1. Diabetes Mellitus w/ peripheral neuropathy 2.  Pain due to onychomycosis of toenails bilateral 3.  Synovitis left ankle  PLAN OF CARE 1. Patient evaluated today. 2. Instructed to maintain good pedal hygiene and foot care. Stressed importance of controlling blood sugar.  3. Mechanical debridement of nails 1-5 bilaterally performed using a nail nipper. Filed with dremel without incident.  4.  Injection of 0.5 cc Celestone Soluspan injected into the left ankle joint 5.  Continue ankle brace 6.  Return to clinic as needed   Edrick Kins, DPM [facility]  Dr. Edrick Kins, DPM    [address], [place] [address]                Office [phone]  Fax [phone]

## 2021-06-24 ENCOUNTER — Ambulatory Visit: Payer: Medicare PPO | Admitting: Podiatry

## 2021-06-26 DIAGNOSIS — F411 Generalized anxiety disorder: Secondary | ICD-10-CM | POA: Diagnosis not present

## 2021-07-06 ENCOUNTER — Telehealth: Payer: Self-pay

## 2021-07-06 DIAGNOSIS — E2839 Other primary ovarian failure: Secondary | ICD-10-CM

## 2021-07-06 NOTE — Telephone Encounter (Signed)
Done

## 2021-07-06 NOTE — Telephone Encounter (Signed)
Pt was called and informed order being placed.

## 2021-07-06 NOTE — Telephone Encounter (Signed)
Pt is requesting bone density test.

## 2021-08-12 ENCOUNTER — Other Ambulatory Visit: Payer: Self-pay | Admitting: Family Medicine

## 2021-08-12 DIAGNOSIS — E559 Vitamin D deficiency, unspecified: Secondary | ICD-10-CM

## 2021-08-18 ENCOUNTER — Other Ambulatory Visit: Payer: Self-pay | Admitting: Family Medicine

## 2021-08-18 DIAGNOSIS — E559 Vitamin D deficiency, unspecified: Secondary | ICD-10-CM

## 2021-08-24 ENCOUNTER — Other Ambulatory Visit: Payer: Self-pay | Admitting: Podiatry

## 2021-08-24 ENCOUNTER — Ambulatory Visit: Payer: Medicare PPO | Admitting: Podiatry

## 2021-08-24 ENCOUNTER — Ambulatory Visit (INDEPENDENT_AMBULATORY_CARE_PROVIDER_SITE_OTHER): Payer: Medicare PPO

## 2021-08-24 DIAGNOSIS — M76822 Posterior tibial tendinitis, left leg: Secondary | ICD-10-CM

## 2021-08-24 DIAGNOSIS — M7752 Other enthesopathy of left foot: Secondary | ICD-10-CM

## 2021-08-24 MED ORDER — BETAMETHASONE SOD PHOS & ACET 6 (3-3) MG/ML IJ SUSP
3.0000 mg | Freq: Once | INTRAMUSCULAR | Status: AC
  Administered 2021-08-24: 3 mg via INTRA_ARTICULAR

## 2021-08-24 NOTE — Progress Notes (Signed)
? ? ?  HPI: 71 y.o. female presenting today for follow-up evaluation of left ankle pain.  Patient states that the last injection into her left ankle joint was not successful.  She continues to have pain and tenderness.  She presents for further treatment and evaluation ? ?Past Medical History:  ?Diagnosis Date  ? Arthritis   ? knees  ? Cancer ([place]) 2000  ? left breast  ? Diabetes mellitus without complication ([place])   ? Hyperlipemia   ? Hypertension   ? Sleep apnea   ? has CPAP but does not wear every night  ? Venous insufficiency of both lower extremities 12/2016  ? 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  Right saphenofemoral junction and right greater saphenous vein incompetence.  ? ?  ?Physical Exam: ?General: The patient is alert and oriented x3 in no acute distress. ? ?Dermatology: Skin is warm, dry and supple bilateral lower extremities. Negative for open lesions or macerations. ? ?Vascular: Palpable pedal pulses bilaterally. No edema or erythema noted. Capillary refill within normal limits. ? ?Neurological: Epicritic and protective threshold grossly intact bilaterally.  ? ?Musculoskeletal Exam: Today the patient seems to be experiencing the most pain on palpation noted along posterior tibial tendon of the left lower extremity. Range of motion within normal limits. Muscle strength 5/5 in all muscle groups bilateral lower extremities. ? ?Radiographic Exam:  ?Normal osseous mineralization. Joint spaces preserved. No fracture or dislocation identified.   ? ?Assessment: ?1. Posterior tibial tendinitis left ? ? ?Plan of Care:  ?1. Patient was evaluated. Radiographs were reviewed today. ?2. Injection of 0.5 mL Celestone Soluspan injected into the posterior tibial tendon sheath.  ?3.  OTC power step arch supports were dispensed for the patient.  Wear daily ?4.  Return to clinic 4 weeks ? ? ?Edrick Kins, DPM ?[place] ? ?Dr. Edrick Kins, DPM  ?  ?[address]                                      ?[place], [place] [address]                ?Office [phone]  ?Fax [phone] ? ?

## 2021-08-27 ENCOUNTER — Ambulatory Visit: Payer: Medicare PPO | Admitting: Family Medicine

## 2021-09-02 ENCOUNTER — Other Ambulatory Visit: Payer: Self-pay | Admitting: Family Medicine

## 2021-09-10 ENCOUNTER — Other Ambulatory Visit: Payer: Self-pay | Admitting: Family Medicine

## 2021-09-10 DIAGNOSIS — I6523 Occlusion and stenosis of bilateral carotid arteries: Secondary | ICD-10-CM

## 2021-09-10 DIAGNOSIS — E785 Hyperlipidemia, unspecified: Secondary | ICD-10-CM

## 2021-09-14 ENCOUNTER — Telehealth: Payer: Self-pay | Admitting: Pharmacist

## 2021-09-14 ENCOUNTER — Other Ambulatory Visit: Payer: Self-pay | Admitting: Family Medicine

## 2021-09-14 DIAGNOSIS — E119 Type 2 diabetes mellitus without complications: Secondary | ICD-10-CM

## 2021-09-14 NOTE — Telephone Encounter (Signed)
Patient appearing on report for [facility] - Hypertension Control report due to last documented ambulatory blood pressure of 170/96 on 03/18/21. Next appointment with PCP is 01/04/22  ? ?Outreached patient to discuss hypertension control and medication management.  ? ?We discussed that she historically did not take medications prior to appointments where she has fasting labs because she thought taking medications was not fasting. I educated to always take medications, especially antihypertensives, prior to appointments.  ? ?Current antihypertensives: lisinopril 20/25 mg daily ? ?Patient has a wrist BP cuff, does not have an automated home BP cuff ? ?Current blood pressure readings: unsure of recent readings ? ?Patient denies hypotensive signs and symptoms including dizziness, lightheadedness.  ?Patient denies hypertensive symptoms including headache, chest pain, shortness of breath. ? ?Patient denies side effects related to lisinopril/HCTZ ? ?Assessment/Plan: ?- Currently uncontrolled ?- - Reviewed goal blood pressure <13090 ?- Counseled on long term microvascular and macrovascular complications of uncontrolled hypertension ?- Reviewed appropriate home BP monitoring technique (avoid caffeine, smoking, and exercise for 30 minutes before checking, rest for at least 5 minutes before taking BP, sit with feet flat on the floor and back against a hard surface, uncross legs, and rest arm on flat surface) ?- Reviewed to check blood pressure daily, document, and provide at next provider visit ?- Discussed the possibility of OTC benefits for upper arm cuff. She will check with her [employer] plan to see if she has benefits ,otherwise, she will look into purchasing an [employer] brand upper arm cuff.  ? ?I will call her back in ~ 3 weeks for BP review. ?  ?Catie Hedwig Morton, PharmD, BCACP ?[place] ?[phone] ? ?

## 2021-09-28 ENCOUNTER — Ambulatory Visit: Payer: Medicare PPO | Admitting: Family Medicine

## 2021-09-29 ENCOUNTER — Other Ambulatory Visit: Payer: Self-pay | Admitting: Family Medicine

## 2021-09-29 DIAGNOSIS — E785 Hyperlipidemia, unspecified: Secondary | ICD-10-CM

## 2021-09-29 DIAGNOSIS — I6523 Occlusion and stenosis of bilateral carotid arteries: Secondary | ICD-10-CM

## 2021-10-12 DIAGNOSIS — E119 Type 2 diabetes mellitus without complications: Secondary | ICD-10-CM | POA: Diagnosis not present

## 2021-10-12 DIAGNOSIS — I10 Essential (primary) hypertension: Secondary | ICD-10-CM | POA: Diagnosis not present

## 2021-10-14 DIAGNOSIS — I10 Essential (primary) hypertension: Secondary | ICD-10-CM | POA: Diagnosis not present

## 2021-10-14 DIAGNOSIS — E119 Type 2 diabetes mellitus without complications: Secondary | ICD-10-CM | POA: Diagnosis not present

## 2021-10-15 ENCOUNTER — Telehealth: Payer: Self-pay | Admitting: Pharmacist

## 2021-10-15 NOTE — Telephone Encounter (Signed)
Attempted to call patient to follow up on home BP readings. Left voicemail for her to return my call at her convenience.   Catie Hedwig Morton, PharmD, [facility] [phone]

## 2021-10-23 NOTE — Telephone Encounter (Signed)
Attempted to call patient to follow up on home BP readings. Unable to reach. LVM requesting call back.

## 2021-10-26 ENCOUNTER — Ambulatory Visit: Payer: Medicare PPO | Admitting: Podiatry

## 2021-10-26 ENCOUNTER — Ambulatory Visit: Payer: Medicare PPO

## 2021-10-26 DIAGNOSIS — E0843 Diabetes mellitus due to underlying condition with diabetic autonomic (poly)neuropathy: Secondary | ICD-10-CM

## 2021-10-26 DIAGNOSIS — B351 Tinea unguium: Secondary | ICD-10-CM

## 2021-10-26 DIAGNOSIS — M76822 Posterior tibial tendinitis, left leg: Secondary | ICD-10-CM

## 2021-10-26 DIAGNOSIS — M79674 Pain in right toe(s): Secondary | ICD-10-CM

## 2021-10-26 DIAGNOSIS — M79675 Pain in left toe(s): Secondary | ICD-10-CM | POA: Diagnosis not present

## 2021-10-26 MED ORDER — MELOXICAM 15 MG PO TABS: 15.0000 mg | ORAL_TABLET | Freq: Every day | ORAL | 3 refills | Status: AC

## 2021-10-26 NOTE — Progress Notes (Signed)
SITUATION Reason for Consult: Evaluation for Prefabricated Diabetic Shoes and Custom Diabetic Inserts. Patient / Caregiver Report: Patient would like well fitting shoes  OBJECTIVE DATA: Patient History / Diagnosis:    ICD-10-CM   1. Diabetes mellitus due to underlying condition with diabetic autonomic neuropathy, unspecified whether long term insulin use (HCC)  E08.43       Physician Treating Diabetes:  [place] Rakes, MD  Current or Previous Devices:   None and no history  In-Person Foot Examination: Ulcers & Callousing:   none Deformities:    hammertoes Sensation:    compromised  Shoe Size:     10W  ORTHOTIC RECOMMENDATION Recommended Devices: - 1x pair prefabricated PDAC approved diabetic shoes; Patient Selected Orthofeet francis 849 black Size 10W - 3x pair custom-to-patient PDAC approved vacuum formed diabetic insoles.  GOALS OF SHOES AND INSOLES - Reduce shear and pressure - Reduce / Prevent callus formation - Reduce / Prevent ulceration - Protect the fragile healing compromised diabetic foot.  Patient would benefit from diabetic shoes and inserts as patient has diabetes mellitus and the patient has one or more of the following conditions: - History of partial or complete amputation of the foot - History of previous foot ulceration. - History of pre-ulcerative callus - Peripheral neuropathy with evidence of callus formation - Foot deformity - Poor circulation  ACTIONS PERFORMED Potential out of pocket cost was communicated to patient. Patient understood and consented to measurement and casting. Patient was casted for insoles via crush box and measured for shoes via brannock device. Procedure was explained and patient tolerated procedure well. All questions were answered and concerns addressed. CMN sent to treating physician. Casts were shipped to central fabrication for HOLD until Certificate of Medical Necessity or otherwise necessary authorization from insurance is  obtained.  PLAN Shoes are to be ordered and casts released from hold once all appropriate paperwork is complete. Patient is to be contacted and scheduled for fitting once shoes and insoles have been fabricated and received.

## 2021-10-26 NOTE — Progress Notes (Addendum)
    HPI: 71 y.o. female presenting today for follow-up evaluation of left ankle pain.  Patient is also requesting a nail trim today.  Patient is diabetic.  She states that the injection did not help last visit.  She continues to have some pain and tenderness to the posterior aspect of the ankle.  Past Medical History:  Diagnosis Date   Arthritis    knees   Cancer ([place]) 2000   left breast   Diabetes mellitus without complication ([place])    Hyperlipemia    Hypertension    Sleep apnea    has CPAP but does not wear every night   Venous insufficiency of both lower extremities 12/2016   Bilateral femoral and small saphenous vein incompetence.  Right saphenofemoral junction and right greater saphenous vein incompetence.     Physical Exam: General: The patient is alert and oriented x3 in no acute distress.  Dermatology: Skin is warm, dry and supple bilateral lower extremities. Negative for open lesions or macerations.  Vascular: Palpable pedal pulses bilaterally. No edema or erythema noted. Capillary refill within normal limits.  Neurological: Epicritic and protective threshold grossly intact bilaterally.   Musculoskeletal Exam: There continues to be some tenderness to palpation along the posterior tibial tendon just posterior to the medial malleolus.. Range of motion within normal limits. Muscle strength 5/5 in all muscle groups bilateral lower extremities.  Reducible hammertoe deformity noted bilateral feet  Radiographic Exam LT ankle 08/24/2021:  Normal osseous mineralization. Joint spaces preserved. No fracture or dislocation identified.    Assessment: 1. Posterior tibial tendinitis left 2.  Pain due to onychomycosis of toenails both 3.  Diabetes mellitus with peripheral polyneuropathy 4. Hammertoes both feet  Plan of Care:  1. Patient was evaluated. Radiographs were reviewed today. 2.  Prescription for meloxicam 15 mg daily  3.  Mechanical debridement of nails 1-5 bilateral was  performed using a nail nipper without incident or bleeding 4.  Appointment with Pedorthist for custom molded diabetic shoes and insoles 5.  Return to clinic 3 months  *Planning to move over the summer to [place], [place]   Edrick Kins, DPM [facility]  Dr. Edrick Kins, DPM    [address], [place] [address]                Office [phone]  Fax [phone]

## 2021-10-29 DIAGNOSIS — Z13820 Encounter for screening for osteoporosis: Secondary | ICD-10-CM | POA: Diagnosis not present

## 2021-10-29 DIAGNOSIS — Z1231 Encounter for screening mammogram for malignant neoplasm of breast: Secondary | ICD-10-CM | POA: Diagnosis not present

## 2021-11-03 DIAGNOSIS — R600 Localized edema: Secondary | ICD-10-CM | POA: Diagnosis not present

## 2021-11-03 DIAGNOSIS — E119 Type 2 diabetes mellitus without complications: Secondary | ICD-10-CM | POA: Diagnosis not present

## 2021-11-03 DIAGNOSIS — I10 Essential (primary) hypertension: Secondary | ICD-10-CM | POA: Diagnosis not present

## 2021-12-04 DIAGNOSIS — C50912 Malignant neoplasm of unspecified site of left female breast: Secondary | ICD-10-CM | POA: Diagnosis not present

## 2021-12-08 DIAGNOSIS — C50912 Malignant neoplasm of unspecified site of left female breast: Secondary | ICD-10-CM | POA: Diagnosis not present

## 2021-12-21 ENCOUNTER — Other Ambulatory Visit: Payer: Medicare PPO

## 2022-01-04 ENCOUNTER — Ambulatory Visit: Payer: Medicare PPO | Admitting: Family Medicine

## 2022-01-05 DIAGNOSIS — R519 Headache, unspecified: Secondary | ICD-10-CM | POA: Diagnosis not present

## 2022-01-05 DIAGNOSIS — S0181XA Laceration without foreign body of other part of head, initial encounter: Secondary | ICD-10-CM | POA: Diagnosis not present

## 2022-01-05 DIAGNOSIS — S0990XA Unspecified injury of head, initial encounter: Secondary | ICD-10-CM | POA: Diagnosis not present

## 2022-01-05 DIAGNOSIS — M542 Cervicalgia: Secondary | ICD-10-CM | POA: Diagnosis not present

## 2022-01-05 DIAGNOSIS — T1490XA Injury, unspecified, initial encounter: Secondary | ICD-10-CM | POA: Diagnosis not present

## 2022-01-11 ENCOUNTER — Other Ambulatory Visit: Payer: Medicare PPO

## 2022-01-11 DIAGNOSIS — I10 Essential (primary) hypertension: Secondary | ICD-10-CM | POA: Diagnosis not present

## 2022-01-11 DIAGNOSIS — S0990XD Unspecified injury of head, subsequent encounter: Secondary | ICD-10-CM | POA: Diagnosis not present

## 2022-01-18 DIAGNOSIS — E119 Type 2 diabetes mellitus without complications: Secondary | ICD-10-CM | POA: Diagnosis not present

## 2022-01-18 DIAGNOSIS — I10 Essential (primary) hypertension: Secondary | ICD-10-CM | POA: Diagnosis not present

## 2022-01-18 DIAGNOSIS — R011 Cardiac murmur, unspecified: Secondary | ICD-10-CM | POA: Diagnosis not present

## 2022-01-19 ENCOUNTER — Encounter: Payer: Self-pay | Admitting: Podiatry

## 2022-01-19 DIAGNOSIS — I10 Essential (primary) hypertension: Secondary | ICD-10-CM | POA: Diagnosis not present

## 2022-01-26 ENCOUNTER — Ambulatory Visit: Payer: Self-pay | Admitting: Licensed Clinical Social Worker

## 2022-01-27 ENCOUNTER — Telehealth: Payer: Self-pay

## 2022-01-27 NOTE — Telephone Encounter (Signed)
Form received from [facility], pt was called to schedule an appointment with PCP to get office notes, Pt states that she is no longer in [place] and will be establishing care with another office.

## 2022-01-27 NOTE — Patient Outreach (Signed)
  Care Coordination   Initial Visit Note   01/27/2022 Name: Erin House MRN: [id number] DOB: 1951-02-24  Erin House is a 71 y.o. year old female who sees Charlott Rakes, MD for primary care. I spoke with  Erin House by phone today.  What matters to the patients health and wellness today?  Pt was informed of Care Coordination services. Not interested at this time    Goals Addressed             This Visit's Progress    COMPLETED: Care Coordination Activities-No follow up required       Care Coordination Interventions: Active listening / Reflection utilized  Emotional Support Provided LCSW informed pt of care coordination services. Pt is not interested in follow up at this time         SDOH assessments and interventions completed:  No     Care Coordination Interventions Activated:  Yes  Care Coordination Interventions:  Yes, provided   Follow up plan: No further intervention required.   Encounter Outcome:  Pt. Visit Completed   Christa See, MSW, [email or web address] Phone [phone] 1:25 AM

## 2022-01-27 NOTE — Patient Instructions (Signed)
Visit Information  Thank you for taking time to visit with me today. Please don't hesitate to contact me if I can be of assistance to you.   Following are the goals we discussed today:   Goals Addressed             This Visit's Progress    COMPLETED: Care Coordination Activities-No follow up required       Care Coordination Interventions: Active listening / Reflection utilized  Emotional Support Provided LCSW informed pt of care coordination services. Pt is not interested in follow up at this time          If you are experiencing a Mental Health or [place] or need someone to talk to, please call the Suicide and Crisis Lifeline: 988 call 911   Patient verbalizes understanding of instructions and care plan provided today and agrees to view in [place]. Active MyChart status and patient understanding of how to access instructions and care plan via MyChart confirmed with patient.     No further follow up required:    Christa See, MSW, [email or web address] Phone [phone] 1:26 AM

## 2022-02-03 DIAGNOSIS — L989 Disorder of the skin and subcutaneous tissue, unspecified: Secondary | ICD-10-CM | POA: Diagnosis not present

## 2022-02-03 DIAGNOSIS — I10 Essential (primary) hypertension: Secondary | ICD-10-CM | POA: Diagnosis not present

## 2022-02-03 DIAGNOSIS — E119 Type 2 diabetes mellitus without complications: Secondary | ICD-10-CM | POA: Diagnosis not present

## 2022-03-08 DIAGNOSIS — I361 Nonrheumatic tricuspid (valve) insufficiency: Secondary | ICD-10-CM | POA: Diagnosis not present

## 2022-03-08 DIAGNOSIS — I082 Rheumatic disorders of both aortic and tricuspid valves: Secondary | ICD-10-CM | POA: Diagnosis not present

## 2022-03-08 DIAGNOSIS — I10 Essential (primary) hypertension: Secondary | ICD-10-CM | POA: Diagnosis not present

## 2022-03-08 DIAGNOSIS — Z9012 Acquired absence of left breast and nipple: Secondary | ICD-10-CM | POA: Diagnosis not present

## 2022-03-08 DIAGNOSIS — Z853 Personal history of malignant neoplasm of breast: Secondary | ICD-10-CM | POA: Diagnosis not present

## 2022-03-08 DIAGNOSIS — R011 Cardiac murmur, unspecified: Secondary | ICD-10-CM | POA: Diagnosis not present

## 2022-03-08 DIAGNOSIS — E7800 Pure hypercholesterolemia, unspecified: Secondary | ICD-10-CM | POA: Diagnosis not present

## 2022-03-08 DIAGNOSIS — E119 Type 2 diabetes mellitus without complications: Secondary | ICD-10-CM | POA: Diagnosis not present

## 2022-03-16 DIAGNOSIS — E119 Type 2 diabetes mellitus without complications: Secondary | ICD-10-CM | POA: Diagnosis not present

## 2022-03-16 DIAGNOSIS — I10 Essential (primary) hypertension: Secondary | ICD-10-CM | POA: Diagnosis not present

## 2022-04-08 ENCOUNTER — Telehealth: Payer: Self-pay | Admitting: Podiatry

## 2022-04-08 NOTE — Telephone Encounter (Signed)
Called patient and let her know we did receive her paperwork, we are uploading them to the system. Needs appointment to pick up diabetic shoes.

## 2022-04-13 NOTE — Telephone Encounter (Signed)
Left 2nd message for patient to call back to pick up diabetic shoes

## 2022-04-26 ENCOUNTER — Ambulatory Visit (INDEPENDENT_AMBULATORY_CARE_PROVIDER_SITE_OTHER): Payer: Medicare PPO | Admitting: *Deleted

## 2022-04-26 DIAGNOSIS — M2042 Other hammer toe(s) (acquired), left foot: Secondary | ICD-10-CM

## 2022-04-26 DIAGNOSIS — E0843 Diabetes mellitus due to underlying condition with diabetic autonomic (poly)neuropathy: Secondary | ICD-10-CM | POA: Diagnosis not present

## 2022-04-26 DIAGNOSIS — M2041 Other hammer toe(s) (acquired), right foot: Secondary | ICD-10-CM | POA: Diagnosis not present

## 2022-04-26 DIAGNOSIS — E1040 Type 1 diabetes mellitus with diabetic neuropathy, unspecified: Secondary | ICD-10-CM

## 2022-05-03 DIAGNOSIS — H524 Presbyopia: Secondary | ICD-10-CM | POA: Diagnosis not present

## 2022-05-03 DIAGNOSIS — H02055 Trichiasis without entropian left lower eyelid: Secondary | ICD-10-CM | POA: Diagnosis not present

## 2022-05-03 DIAGNOSIS — H5203 Hypermetropia, bilateral: Secondary | ICD-10-CM | POA: Diagnosis not present

## 2022-05-03 DIAGNOSIS — H43812 Vitreous degeneration, left eye: Secondary | ICD-10-CM | POA: Diagnosis not present

## 2022-05-03 DIAGNOSIS — E119 Type 2 diabetes mellitus without complications: Secondary | ICD-10-CM | POA: Diagnosis not present

## 2022-05-03 DIAGNOSIS — H52223 Regular astigmatism, bilateral: Secondary | ICD-10-CM | POA: Diagnosis not present

## 2022-05-06 DIAGNOSIS — C50912 Malignant neoplasm of unspecified site of left female breast: Secondary | ICD-10-CM | POA: Diagnosis not present

## 2022-05-18 NOTE — Progress Notes (Signed)
Patient presents today to pick up diabetic shoes and insoles.  Patient was dispensed 1 pair of diabetic shoes and 3 pairs of foam casted diabetic insoles. She tried on the shoes with the insoles and the fit was satisfactory.   Will follow up with Dr Amalia Hailey next year for new order.

## 2022-05-27 ENCOUNTER — Telehealth: Payer: Self-pay

## 2022-05-27 NOTE — Telephone Encounter (Signed)
Pt informed that we do not have any pamphlets for patient to give out to other people.     Copied from [place] [phone]. Topic: General - Other >> May 27, 2022  2:44 PM [place] B wrote: Reason for CRM: Patient called in asking if pamphlets can be sent to her regarding info on heart attacks and strokes that she can pass out to people.

## 2022-06-28 ENCOUNTER — Other Ambulatory Visit: Payer: Self-pay | Admitting: Family Medicine

## 2022-06-28 DIAGNOSIS — E119 Type 2 diabetes mellitus without complications: Secondary | ICD-10-CM

## 2022-06-29 NOTE — Telephone Encounter (Signed)
Requested medication (s) are due for refill today: yes  Requested medication (s) are on the active medication list: yes  Last refill:  yes  Future visit scheduled: no  Notes to clinic:  Unable to refill per protocol due to failed labs, no updated results.      Requested Prescriptions  Pending Prescriptions Disp Refills   TRULICITY 1.5 IH/0.3UU SOPN [Pharmacy Med Name: TRULICITY 1.5 EK/8.0KL Solution Pen-injector]  3    Sig: INJECT 1.5 MG INTO THE SKIN ONE TIME A WEEK.     There is no refill protocol information for this order

## 2022-07-05 DIAGNOSIS — I10 Essential (primary) hypertension: Secondary | ICD-10-CM | POA: Diagnosis not present

## 2022-07-05 DIAGNOSIS — E119 Type 2 diabetes mellitus without complications: Secondary | ICD-10-CM | POA: Diagnosis not present

## 2022-07-20 DIAGNOSIS — M25571 Pain in right ankle and joints of right foot: Secondary | ICD-10-CM | POA: Diagnosis not present

## 2022-07-20 DIAGNOSIS — M79672 Pain in left foot: Secondary | ICD-10-CM | POA: Diagnosis not present

## 2022-07-20 DIAGNOSIS — M79671 Pain in right foot: Secondary | ICD-10-CM | POA: Diagnosis not present

## 2022-07-20 DIAGNOSIS — M25572 Pain in left ankle and joints of left foot: Secondary | ICD-10-CM | POA: Diagnosis not present

## 2022-07-20 DIAGNOSIS — E119 Type 2 diabetes mellitus without complications: Secondary | ICD-10-CM | POA: Diagnosis not present

## 2022-07-20 DIAGNOSIS — R262 Difficulty in walking, not elsewhere classified: Secondary | ICD-10-CM | POA: Diagnosis not present

## 2022-07-21 DIAGNOSIS — Z0000 Encounter for general adult medical examination without abnormal findings: Secondary | ICD-10-CM | POA: Diagnosis not present

## 2022-08-26 ENCOUNTER — Other Ambulatory Visit: Payer: Self-pay | Admitting: Family Medicine

## 2022-08-26 DIAGNOSIS — E119 Type 2 diabetes mellitus without complications: Secondary | ICD-10-CM

## 2022-08-27 NOTE — Telephone Encounter (Signed)
Requested medication (s) are due for refill today: Yes  Requested medication (s) are on the active medication list: Yes  Last refill:  4.24.23  Future visit scheduled: No  Notes to clinic:  Unable to reach pt. To make appointment.    Requested Prescriptions  Pending Prescriptions Disp Refills   TRULICITY 1.5 MG/0.5ML SOPN [Pharmacy Med Name: TRULICITY 1.5 MG/0.5ML Solution Pen-injector]  3    Sig: INJECT 1.5 MG INTO THE SKIN ONE TIME A WEEK.     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

## 2022-11-29 DIAGNOSIS — E119 Type 2 diabetes mellitus without complications: Secondary | ICD-10-CM | POA: Diagnosis not present

## 2022-12-01 DIAGNOSIS — R220 Localized swelling, mass and lump, head: Secondary | ICD-10-CM | POA: Diagnosis not present

## 2022-12-01 DIAGNOSIS — I10 Essential (primary) hypertension: Secondary | ICD-10-CM | POA: Diagnosis not present

## 2022-12-01 DIAGNOSIS — E119 Type 2 diabetes mellitus without complications: Secondary | ICD-10-CM | POA: Diagnosis not present

## 2023-01-04 DIAGNOSIS — K115 Sialolithiasis: Secondary | ICD-10-CM | POA: Diagnosis not present

## 2023-01-12 DIAGNOSIS — Z1231 Encounter for screening mammogram for malignant neoplasm of breast: Secondary | ICD-10-CM | POA: Diagnosis not present

## 2023-01-12 DIAGNOSIS — N6452 Nipple discharge: Secondary | ICD-10-CM | POA: Diagnosis not present

## 2023-02-21 DIAGNOSIS — M25511 Pain in right shoulder: Secondary | ICD-10-CM | POA: Diagnosis not present

## 2023-02-21 DIAGNOSIS — I10 Essential (primary) hypertension: Secondary | ICD-10-CM | POA: Diagnosis not present

## 2023-02-21 DIAGNOSIS — R220 Localized swelling, mass and lump, head: Secondary | ICD-10-CM | POA: Diagnosis not present

## 2023-02-21 DIAGNOSIS — E119 Type 2 diabetes mellitus without complications: Secondary | ICD-10-CM | POA: Diagnosis not present

## 2023-02-23 DIAGNOSIS — M25511 Pain in right shoulder: Secondary | ICD-10-CM | POA: Diagnosis not present

## 2023-02-23 DIAGNOSIS — M75101 Unspecified rotator cuff tear or rupture of right shoulder, not specified as traumatic: Secondary | ICD-10-CM | POA: Diagnosis not present

## 2023-02-24 DIAGNOSIS — M25511 Pain in right shoulder: Secondary | ICD-10-CM | POA: Diagnosis not present

## 2023-02-28 DIAGNOSIS — M25511 Pain in right shoulder: Secondary | ICD-10-CM | POA: Diagnosis not present

## 2023-03-01 DIAGNOSIS — M25511 Pain in right shoulder: Secondary | ICD-10-CM | POA: Diagnosis not present

## 2023-03-03 DIAGNOSIS — M25511 Pain in right shoulder: Secondary | ICD-10-CM | POA: Diagnosis not present

## 2023-03-07 DIAGNOSIS — M25511 Pain in right shoulder: Secondary | ICD-10-CM | POA: Diagnosis not present

## 2023-03-08 DIAGNOSIS — M25511 Pain in right shoulder: Secondary | ICD-10-CM | POA: Diagnosis not present

## 2023-03-14 DIAGNOSIS — M25511 Pain in right shoulder: Secondary | ICD-10-CM | POA: Diagnosis not present

## 2023-03-15 DIAGNOSIS — M25511 Pain in right shoulder: Secondary | ICD-10-CM | POA: Diagnosis not present

## 2023-03-17 DIAGNOSIS — M25511 Pain in right shoulder: Secondary | ICD-10-CM | POA: Diagnosis not present

## 2023-03-23 DIAGNOSIS — M25511 Pain in right shoulder: Secondary | ICD-10-CM | POA: Diagnosis not present

## 2023-03-28 DIAGNOSIS — M25511 Pain in right shoulder: Secondary | ICD-10-CM | POA: Diagnosis not present

## 2023-03-30 DIAGNOSIS — M25511 Pain in right shoulder: Secondary | ICD-10-CM | POA: Diagnosis not present

## 2023-04-05 DIAGNOSIS — M25511 Pain in right shoulder: Secondary | ICD-10-CM | POA: Diagnosis not present

## 2023-04-07 DIAGNOSIS — M25511 Pain in right shoulder: Secondary | ICD-10-CM | POA: Diagnosis not present

## 2023-04-12 DIAGNOSIS — H903 Sensorineural hearing loss, bilateral: Secondary | ICD-10-CM | POA: Diagnosis not present

## 2023-04-12 DIAGNOSIS — H60549 Acute eczematoid otitis externa, unspecified ear: Secondary | ICD-10-CM | POA: Diagnosis not present

## 2023-04-12 DIAGNOSIS — H6123 Impacted cerumen, bilateral: Secondary | ICD-10-CM | POA: Diagnosis not present

## 2023-04-12 DIAGNOSIS — K115 Sialolithiasis: Secondary | ICD-10-CM | POA: Diagnosis not present

## 2023-05-09 DIAGNOSIS — E119 Type 2 diabetes mellitus without complications: Secondary | ICD-10-CM | POA: Diagnosis not present

## 2023-05-09 DIAGNOSIS — H524 Presbyopia: Secondary | ICD-10-CM | POA: Diagnosis not present

## 2023-05-09 DIAGNOSIS — H52223 Regular astigmatism, bilateral: Secondary | ICD-10-CM | POA: Diagnosis not present

## 2023-05-09 DIAGNOSIS — H43812 Vitreous degeneration, left eye: Secondary | ICD-10-CM | POA: Diagnosis not present

## 2023-05-09 DIAGNOSIS — H5203 Hypermetropia, bilateral: Secondary | ICD-10-CM | POA: Diagnosis not present
